# Patient Record
Sex: Female | Born: 1955 | Race: White | Hispanic: No | Marital: Single | Smoking: Never smoker
Health system: Southern US, Community
[De-identification: ages and names within clinical notes are randomized; demographics above are authoritative.]

## PROBLEM LIST (undated history)

## (undated) DIAGNOSIS — H269 Unspecified cataract: Secondary | ICD-10-CM

## (undated) DIAGNOSIS — C801 Malignant (primary) neoplasm, unspecified: Secondary | ICD-10-CM

## (undated) DIAGNOSIS — E039 Hypothyroidism, unspecified: Secondary | ICD-10-CM

## (undated) DIAGNOSIS — M51369 Other intervertebral disc degeneration, lumbar region without mention of lumbar back pain or lower extremity pain: Secondary | ICD-10-CM

## (undated) DIAGNOSIS — T8859XA Other complications of anesthesia, initial encounter: Secondary | ICD-10-CM

## (undated) DIAGNOSIS — E079 Disorder of thyroid, unspecified: Secondary | ICD-10-CM

## (undated) DIAGNOSIS — G709 Myoneural disorder, unspecified: Secondary | ICD-10-CM

## (undated) DIAGNOSIS — M169 Osteoarthritis of hip, unspecified: Secondary | ICD-10-CM

## (undated) DIAGNOSIS — H409 Unspecified glaucoma: Secondary | ICD-10-CM

## (undated) DIAGNOSIS — L309 Dermatitis, unspecified: Secondary | ICD-10-CM

## (undated) DIAGNOSIS — T148XXA Other injury of unspecified body region, initial encounter: Secondary | ICD-10-CM

## (undated) DIAGNOSIS — G4700 Insomnia, unspecified: Secondary | ICD-10-CM

## (undated) DIAGNOSIS — T783XXA Angioneurotic edema, initial encounter: Secondary | ICD-10-CM

## (undated) DIAGNOSIS — M3500 Sicca syndrome, unspecified: Secondary | ICD-10-CM

## (undated) DIAGNOSIS — F32A Depression, unspecified: Secondary | ICD-10-CM

## (undated) DIAGNOSIS — J449 Chronic obstructive pulmonary disease, unspecified: Secondary | ICD-10-CM

## (undated) DIAGNOSIS — S42309A Unspecified fracture of shaft of humerus, unspecified arm, initial encounter for closed fracture: Secondary | ICD-10-CM

## (undated) DIAGNOSIS — M5126 Other intervertebral disc displacement, lumbar region: Secondary | ICD-10-CM

## (undated) DIAGNOSIS — T7840XA Allergy, unspecified, initial encounter: Secondary | ICD-10-CM

## (undated) DIAGNOSIS — M329 Systemic lupus erythematosus, unspecified: Secondary | ICD-10-CM

## (undated) DIAGNOSIS — L509 Urticaria, unspecified: Secondary | ICD-10-CM

## (undated) DIAGNOSIS — T4145XA Adverse effect of unspecified anesthetic, initial encounter: Secondary | ICD-10-CM

## (undated) DIAGNOSIS — M25559 Pain in unspecified hip: Secondary | ICD-10-CM

## (undated) DIAGNOSIS — M5136 Other intervertebral disc degeneration, lumbar region: Secondary | ICD-10-CM

## (undated) DIAGNOSIS — R945 Abnormal results of liver function studies: Secondary | ICD-10-CM

## (undated) DIAGNOSIS — R7989 Other specified abnormal findings of blood chemistry: Secondary | ICD-10-CM

## (undated) DIAGNOSIS — E785 Hyperlipidemia, unspecified: Secondary | ICD-10-CM

## (undated) DIAGNOSIS — G4730 Sleep apnea, unspecified: Secondary | ICD-10-CM

## (undated) DIAGNOSIS — K219 Gastro-esophageal reflux disease without esophagitis: Secondary | ICD-10-CM

## (undated) DIAGNOSIS — F4310 Post-traumatic stress disorder, unspecified: Secondary | ICD-10-CM

## (undated) HISTORY — PX: OTHER SURGICAL HISTORY: SHX169

## (undated) HISTORY — DX: Post-traumatic stress disorder, unspecified: F43.10

## (undated) HISTORY — DX: Hyperlipidemia, unspecified: E78.5

## (undated) HISTORY — DX: Depression, unspecified: F32.A

## (undated) HISTORY — DX: Unspecified cataract: H26.9

## (undated) HISTORY — DX: Allergy, unspecified, initial encounter: T78.40XA

## (undated) HISTORY — DX: Dermatitis, unspecified: L30.9

## (undated) HISTORY — PX: COSMETIC SURGERY: SHX468

## (undated) HISTORY — DX: Urticaria, unspecified: L50.9

## (undated) HISTORY — DX: Unspecified glaucoma: H40.9

## (undated) HISTORY — DX: Gastro-esophageal reflux disease without esophagitis: K21.9

## (undated) HISTORY — DX: Angioneurotic edema, initial encounter: T78.3XXA

## (undated) HISTORY — PX: HERNIA REPAIR: SHX51

## (undated) HISTORY — DX: Myoneural disorder, unspecified: G70.9

---

## 1978-01-19 DIAGNOSIS — K900 Celiac disease: Secondary | ICD-10-CM | POA: Insufficient documentation

## 2009-03-25 ENCOUNTER — Emergency Department (HOSPITAL_COMMUNITY): Admission: EM | Admit: 2009-03-25 | Discharge: 2009-03-25 | Payer: Self-pay | Admitting: Emergency Medicine

## 2009-07-28 ENCOUNTER — Emergency Department (HOSPITAL_COMMUNITY): Admission: EM | Admit: 2009-07-28 | Discharge: 2009-07-28 | Payer: Self-pay | Admitting: Emergency Medicine

## 2009-10-20 ENCOUNTER — Emergency Department (HOSPITAL_COMMUNITY): Admission: EM | Admit: 2009-10-20 | Discharge: 2009-10-20 | Payer: Self-pay | Admitting: Emergency Medicine

## 2009-11-10 ENCOUNTER — Emergency Department (HOSPITAL_COMMUNITY): Admission: EM | Admit: 2009-11-10 | Discharge: 2009-11-10 | Payer: Self-pay | Admitting: Emergency Medicine

## 2010-03-15 ENCOUNTER — Ambulatory Visit (HOSPITAL_COMMUNITY): Admission: RE | Admit: 2010-03-15 | Discharge: 2010-03-15 | Payer: Self-pay | Admitting: Gastroenterology

## 2010-09-10 ENCOUNTER — Emergency Department (HOSPITAL_COMMUNITY)
Admission: EM | Admit: 2010-09-10 | Discharge: 2010-09-10 | Disposition: A | Discharge status: Home / Self Care | Payer: Self-pay | Attending: Emergency Medicine | Admitting: Emergency Medicine

## 2010-09-10 ENCOUNTER — Emergency Department (HOSPITAL_COMMUNITY): Admit: 2010-09-10 | Discharge: 2010-09-10 | Disposition: A | Discharge status: Home / Self Care | Payer: Self-pay

## 2010-09-10 DIAGNOSIS — R0602 Shortness of breath: Secondary | ICD-10-CM | POA: Insufficient documentation

## 2010-09-10 DIAGNOSIS — R0989 Other specified symptoms and signs involving the circulatory and respiratory systems: Secondary | ICD-10-CM | POA: Insufficient documentation

## 2010-09-10 DIAGNOSIS — Z79899 Other long term (current) drug therapy: Secondary | ICD-10-CM | POA: Insufficient documentation

## 2010-09-10 DIAGNOSIS — J069 Acute upper respiratory infection, unspecified: Secondary | ICD-10-CM | POA: Insufficient documentation

## 2010-09-10 DIAGNOSIS — E039 Hypothyroidism, unspecified: Secondary | ICD-10-CM | POA: Insufficient documentation

## 2010-09-10 DIAGNOSIS — R059 Cough, unspecified: Secondary | ICD-10-CM | POA: Insufficient documentation

## 2010-09-10 DIAGNOSIS — J3489 Other specified disorders of nose and nasal sinuses: Secondary | ICD-10-CM | POA: Insufficient documentation

## 2010-09-10 DIAGNOSIS — R05 Cough: Secondary | ICD-10-CM | POA: Insufficient documentation

## 2010-09-10 DIAGNOSIS — R0609 Other forms of dyspnea: Secondary | ICD-10-CM | POA: Insufficient documentation

## 2010-09-10 DIAGNOSIS — J45909 Unspecified asthma, uncomplicated: Secondary | ICD-10-CM | POA: Insufficient documentation

## 2010-10-26 LAB — DIFFERENTIAL
Lymphocytes Relative: 22 % (ref 12–46)
Lymphs Abs: 2.2 10*3/uL (ref 0.7–4.0)
Monocytes Absolute: 0.6 10*3/uL (ref 0.1–1.0)
Neutro Abs: 6.8 10*3/uL (ref 1.7–7.7)
Neutrophils Relative %: 66 % (ref 43–77)

## 2010-10-26 LAB — CBC
HCT: 37.7 % (ref 36.0–46.0)
MCV: 81.3 fL (ref 78.0–100.0)

## 2010-11-03 ENCOUNTER — Ambulatory Visit (HOSPITAL_COMMUNITY): Payer: Self-pay

## 2010-11-08 ENCOUNTER — Ambulatory Visit (HOSPITAL_COMMUNITY)
Admission: RE | Admit: 2010-11-08 | Discharge: 2010-11-08 | Disposition: A | Discharge status: Home / Self Care | Payer: Self-pay | Source: Ambulatory Visit | Attending: Neurology | Admitting: Neurology

## 2010-11-08 DIAGNOSIS — R0609 Other forms of dyspnea: Secondary | ICD-10-CM | POA: Insufficient documentation

## 2010-11-08 DIAGNOSIS — R0989 Other specified symptoms and signs involving the circulatory and respiratory systems: Secondary | ICD-10-CM | POA: Insufficient documentation

## 2010-11-08 LAB — BLOOD GAS, ARTERIAL
Acid-Base Excess: 1.3 mmol/L (ref 0.0–2.0)
FIO2: 21 %
O2 Content: 21 L/min
TCO2: 21.7 mmol/L (ref 0–100)
pH, Arterial: 7.441 — ABNORMAL HIGH (ref 7.350–7.400)
pO2, Arterial: 87.5 mmHg (ref 80.0–100.0)

## 2011-01-03 ENCOUNTER — Emergency Department (HOSPITAL_COMMUNITY)
Admission: EM | Admit: 2011-01-03 | Discharge: 2011-01-03 | Disposition: A | Discharge status: Home / Self Care | Payer: Self-pay | Attending: Emergency Medicine | Admitting: Emergency Medicine

## 2011-01-03 DIAGNOSIS — H6990 Unspecified Eustachian tube disorder, unspecified ear: Secondary | ICD-10-CM | POA: Insufficient documentation

## 2011-01-03 DIAGNOSIS — J45909 Unspecified asthma, uncomplicated: Secondary | ICD-10-CM | POA: Insufficient documentation

## 2011-01-03 DIAGNOSIS — Z79899 Other long term (current) drug therapy: Secondary | ICD-10-CM | POA: Insufficient documentation

## 2011-01-03 DIAGNOSIS — E039 Hypothyroidism, unspecified: Secondary | ICD-10-CM | POA: Insufficient documentation

## 2011-01-03 DIAGNOSIS — H9209 Otalgia, unspecified ear: Secondary | ICD-10-CM | POA: Insufficient documentation

## 2011-01-03 DIAGNOSIS — E669 Obesity, unspecified: Secondary | ICD-10-CM | POA: Insufficient documentation

## 2011-01-03 DIAGNOSIS — H6980 Other specified disorders of Eustachian tube, unspecified ear: Secondary | ICD-10-CM | POA: Insufficient documentation

## 2011-06-14 ENCOUNTER — Other Ambulatory Visit (HOSPITAL_COMMUNITY): Payer: Self-pay | Admitting: Physician Assistant

## 2011-06-14 DIAGNOSIS — Z139 Encounter for screening, unspecified: Secondary | ICD-10-CM

## 2011-06-19 ENCOUNTER — Other Ambulatory Visit: Payer: Self-pay | Admitting: Obstetrics and Gynecology

## 2011-06-19 DIAGNOSIS — Z139 Encounter for screening, unspecified: Secondary | ICD-10-CM

## 2011-06-20 ENCOUNTER — Ambulatory Visit (HOSPITAL_COMMUNITY): Payer: Self-pay

## 2011-06-22 ENCOUNTER — Ambulatory Visit (HOSPITAL_COMMUNITY)
Admission: RE | Admit: 2011-06-22 | Discharge: 2011-06-22 | Disposition: A | Discharge status: Home / Self Care | Payer: PRIVATE HEALTH INSURANCE | Source: Ambulatory Visit | Attending: Obstetrics and Gynecology | Admitting: Obstetrics and Gynecology

## 2011-06-22 DIAGNOSIS — Z139 Encounter for screening, unspecified: Secondary | ICD-10-CM

## 2011-10-30 ENCOUNTER — Encounter (HOSPITAL_COMMUNITY): Payer: Self-pay | Admitting: *Deleted

## 2011-10-30 ENCOUNTER — Emergency Department (HOSPITAL_COMMUNITY): Payer: Worker's Compensation

## 2011-10-30 ENCOUNTER — Emergency Department (HOSPITAL_COMMUNITY)
Admission: EM | Admit: 2011-10-30 | Discharge: 2011-10-30 | Disposition: A | Discharge status: Home / Self Care | Payer: Worker's Compensation | Attending: Emergency Medicine | Admitting: Emergency Medicine

## 2011-10-30 DIAGNOSIS — S52123A Displaced fracture of head of unspecified radius, initial encounter for closed fracture: Secondary | ICD-10-CM | POA: Insufficient documentation

## 2011-10-30 DIAGNOSIS — W19XXXA Unspecified fall, initial encounter: Secondary | ICD-10-CM | POA: Insufficient documentation

## 2011-10-30 DIAGNOSIS — M25529 Pain in unspecified elbow: Secondary | ICD-10-CM | POA: Insufficient documentation

## 2011-10-30 MED ORDER — IBUPROFEN 400 MG PO TABS
400.0000 mg | ORAL_TABLET | Freq: Once | ORAL | Status: AC
  Administered 2011-10-30: 400 mg via ORAL
  Filled 2011-10-30: qty 1

## 2011-10-30 MED ORDER — TRAMADOL HCL 50 MG PO TABS: 50.0000 mg | ORAL_TABLET | Freq: Four times a day (QID) | ORAL | Status: AC | PRN

## 2011-10-30 NOTE — Discharge Instructions (Signed)
Radial Head Fracture  A radial head fracture is a break of the smaller bone (radius) in the forearm. The head of this bone is the part near the elbow. These fractures commonly happen during a fall when you land on the outstretched arm. These fractures are more common in middle aged adults and are common with a dislocation of the elbow.  SYMPTOMS    Swelling of the elbow joint and pain on the outside of the elbow.   Pain and difficulty in bending or straightening the elbow.   Pain and difficulty in turning the palm of the hand up or down with the elbow bent.  DIAGNOSIS   Your caregiver may make this diagnosis by a physical exam. X-rays can confirm the type and amount of break. Sometimes a break which is not displaced cannot be seen on the original x-ray.  TREATMENT   Radial head fractures are classified according to the amount of movement (displacement) of parts from the normal position.   Type 1 Fractures   Type 1 fractures are generally small fractures in which bone pieces remain together (non-displaced fracture).   The fracture may not be seen on initial X-rays. Usually if x-rays are repeated two to three weeks later, the fracture will show up. A splint or sling is used for a few days. Gentle early motion is used to prevent the elbow from becoming stiff. It should not be done vigorously or forced as this could displace the bone pieces.  Type 2 Fractures   With type 2 fractures, bone pieces are slightly displaced and larger pieces of bone are broken off.   If only a little displacement of the bone piece is present, splinting for 4 to 5 days usually works well. This is again followed with gentle active range of motion. Small fragments may be surgically removed.   Large pieces of bone that can be put back into place will sometimes be fixed with pins or screws to hold them until the bone is healed. If this cannot be done, the fragments are removed. For older, less active people, sometimes the entire radial  head is removed if the wrist is not injured. The elbow and arm will still work fine. Soft tissue, tendon, and ligament injuries are corrected at the same time.  Type 3 Fractures   Type 3 fractures have multiple broken pieces of bone which cannot be fixed. Surgery is usually needed to remove the broken bits of bone and what is left of the radial head. Soft-tissue damage is repaired. Gentle early motion is used to prevent the elbow from becoming stiff. Sometimes an artificial radial head can be used to prevent deformity if elbow is instable.  Rest, ice, elevation, immobilization, medications, and pain control are used in the early care.  HOME CARE INSTRUCTIONS    Keep the injured part elevated while sitting or lying down. Keep the injury above the level of your heart (the center of the chest). This will decrease swelling and pain.   Apply ice to the injury for 15 to 20 minutes, 3 to 4 times per day while awake, for 2 days. Put the ice in a plastic bag and place a towel between the bag of ice and your cast or splint.   Move your fingers to avoid stiffness and minimize swelling.   If you have a plaster or fiberglass cast:   Do not try to scratch the skin under the cast using sharp or pointed objects.   Check the skin   around the cast every day. You may put lotion on any red or sore areas.   Keep your cast dry and clean.   If you have a plaster splint:   Wear the splint as directed.   You may loosen the elastic around the splint if your fingers become numb, tingle, or turn cold or blue.   Do not put pressure on any part of your cast or splint. It may break. Rest your cast only on a pillow for the first 24 hours until it is fully hardened.   Your cast or splint can be protected during bathing with a plastic bag. Do not lower the cast or splint into water.   Only take over-the-counter or prescription medicines for pain, discomfort, or fever as directed by your caregiver.   Follow all instructions for follow  up with your caregiver. This includes any orthopedic referrals, physical therapy and rehabilitation. Any delay in obtaining necessary care could result in a delay or failure of the bones to heal or permanent elbow stiffness.   Do not over do exercises. This could further damage your injury.  SEEK IMMEDIATE MEDICAL CARE IF:    Your cast or splint gets damaged or breaks.   You have more severe pain or swelling than you did before getting the cast.   You have severe pain when stretching your fingers.   There is a bad smell, new stains and/or pus-like (purulent) drainage coming from under the cast.   Your fingers or hand turn pale or blue, become cold, or you lose feeling.  Document Released: 05/15/2006 Document Revised: 07/13/2011 Document Reviewed: 06/22/2009  [employer] Patient Information 2012 [employer].

## 2011-10-30 NOTE — ED Notes (Signed)
Foam sling applied to right arm

## 2011-10-30 NOTE — ED Provider Notes (Signed)
History     CSN: [id number]  Arrival date & time 10/30/11  1904   First MD Initiated Contact with Patient 10/30/11 1928      Chief Complaint  Patient presents with  . Fall    (Consider location/radiation/quality/duration/timing/severity/associated sxs/prior treatment) HPI Pt had mechanical trip and fall landing on R elbow. Since she has had pain with full range of motion. No swelling, bruising noted. No head, neck or lower ext pain.  Past Medical History  Diagnosis Date  . Asthma     History reviewed. No pertinent past surgical history.  No family history on file.  History  Substance Use Topics  . Smoking status: Never Smoker   . Smokeless tobacco: Not on file  . Alcohol Use: No    OB History    Grav Para Term Preterm Abortions TAB SAB Ect Mult Living                  Review of Systems  Neurological: Negative for syncope, weakness and numbness.    Allergies  Jasmine fragrance; Pork-derived products; Latex; and Sulfa antibiotics  Home Medications   Current Outpatient Rx  Name Route Sig Dispense Refill  . ACETAMINOPHEN 500 MG PO TABS Oral Take 1,000 mg by mouth as needed. For pain    . ALBUTEROL SULFATE HFA 108 (90 BASE) MCG/ACT IN AERS Inhalation Inhale 2 puffs into the lungs every 6 (six) hours as needed. For allergies    . IBUPROFEN 200 MG PO TABS Oral Take 400 mg by mouth once as needed. For hip pain    . LEVOTHYROXINE SODIUM 100 MCG PO TABS Oral Take 100 mcg by mouth every morning.    Marland Kitchen TRAMADOL HCL 50 MG PO TABS Oral Take 1 tablet (50 mg total) by mouth every 6 (six) hours as needed for pain. 20 tablet 0    BP 139/82  Pulse 106  Temp(Src) 97.9 F (36.6 C) (Oral)  Resp 16  Ht 5\' 6"  (1.676 m)  Wt 327 lb (148.326 kg)  BMI 52.78 kg/m2  Physical Exam  Nursing note and vitals reviewed. Constitutional: She is oriented to person, place, and time. She appears well-developed and well-nourished. No distress.  HENT:  Head: Normocephalic and atraumatic.    Mouth/Throat: Oropharynx is clear and moist.  Eyes: EOM are normal. Pupils are equal, round, and reactive to light.  Neck: Normal range of motion. Neck supple.  Cardiovascular: Normal rate and regular rhythm.   Pulmonary/Chest: Effort normal and breath sounds normal. No respiratory distress. She has no wheezes. She has no rales.  Abdominal: Soft. Bowel sounds are normal.  Musculoskeletal: She exhibits tenderness. She exhibits no edema.       Decreased ROM due to pain. Most painful with flexion. No definite injury noted. 2+ radial pulses, sensation and motor intact.   Neurological: She is alert and oriented to person, place, and time.  Skin: Skin is warm and dry. No rash noted. No erythema.  Psychiatric: She has a normal mood and affect. Her behavior is normal.    ED Course  Procedures (including critical care time)  Labs Reviewed - No data to display Dg Elbow Complete Right  10/30/2011  *RADIOLOGY REPORT*  Clinical Data: Status post fall next field  RIGHT ELBOW - COMPLETE 3+ VIEW  Comparison: None.  Findings: There is a joint effusion with elevation of the anterior posterior fat pads.  There is a nondisplaced, intra-articular fracture deformity involving the head of the radius.  No dislocations.  IMPRESSION:  1.  Radial head fracture. 2.  Joint effusion.  Original Report Authenticated By: Rosealee Albee, M.D.     1. Radial head fracture, closed       MDM  Will check xray and place in sling for comfort        Loren Racer, MD 10/30/11 2122

## 2011-10-30 NOTE — ED Notes (Signed)
Fell at work, states she has pain in right elbow, states she hit her head, right shoulder and right knee. Only has significant pain in right elbow. 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

## 2011-11-01 ENCOUNTER — Ambulatory Visit (HOSPITAL_COMMUNITY)
Admission: RE | Admit: 2011-11-01 | Discharge: 2011-11-01 | Disposition: A | Discharge status: Home / Self Care | Payer: Worker's Compensation | Source: Ambulatory Visit | Attending: Orthopaedic Surgery | Admitting: Orthopaedic Surgery

## 2011-11-01 ENCOUNTER — Other Ambulatory Visit (HOSPITAL_COMMUNITY): Payer: Self-pay | Admitting: Orthopaedic Surgery

## 2011-11-01 DIAGNOSIS — W19XXXA Unspecified fall, initial encounter: Secondary | ICD-10-CM

## 2011-11-01 DIAGNOSIS — M25569 Pain in unspecified knee: Secondary | ICD-10-CM | POA: Insufficient documentation

## 2011-11-01 DIAGNOSIS — R52 Pain, unspecified: Secondary | ICD-10-CM

## 2012-02-08 ENCOUNTER — Encounter (HOSPITAL_COMMUNITY): Payer: Self-pay | Admitting: *Deleted

## 2012-02-08 ENCOUNTER — Emergency Department (HOSPITAL_COMMUNITY)
Admission: EM | Admit: 2012-02-08 | Discharge: 2012-02-08 | Disposition: A | Discharge status: Home / Self Care | Payer: Worker's Compensation | Attending: Emergency Medicine | Admitting: Emergency Medicine

## 2012-02-08 DIAGNOSIS — M25539 Pain in unspecified wrist: Secondary | ICD-10-CM | POA: Insufficient documentation

## 2012-02-08 DIAGNOSIS — E079 Disorder of thyroid, unspecified: Secondary | ICD-10-CM | POA: Insufficient documentation

## 2012-02-08 DIAGNOSIS — M169 Osteoarthritis of hip, unspecified: Secondary | ICD-10-CM | POA: Insufficient documentation

## 2012-02-08 DIAGNOSIS — M1610 Unilateral primary osteoarthritis, unspecified hip: Secondary | ICD-10-CM | POA: Insufficient documentation

## 2012-02-08 DIAGNOSIS — M79631 Pain in right forearm: Secondary | ICD-10-CM

## 2012-02-08 DIAGNOSIS — J4489 Other specified chronic obstructive pulmonary disease: Secondary | ICD-10-CM | POA: Insufficient documentation

## 2012-02-08 DIAGNOSIS — J449 Chronic obstructive pulmonary disease, unspecified: Secondary | ICD-10-CM | POA: Insufficient documentation

## 2012-02-08 HISTORY — DX: Osteoarthritis of hip, unspecified: M16.9

## 2012-02-08 HISTORY — DX: Chronic obstructive pulmonary disease, unspecified: J44.9

## 2012-02-08 HISTORY — DX: Unspecified fracture of shaft of humerus, unspecified arm, initial encounter for closed fracture: S42.309A

## 2012-02-08 HISTORY — DX: Disorder of thyroid, unspecified: E07.9

## 2012-02-08 MED ORDER — ONDANSETRON HCL 4 MG PO TABS: 8.0000 mg | ORAL_TABLET | Freq: Three times a day (TID) | ORAL | Status: AC | PRN

## 2012-02-08 MED ORDER — HYDROMORPHONE HCL PF 1 MG/ML IJ SOLN
1.0000 mg | Freq: Once | INTRAMUSCULAR | Status: AC
  Administered 2012-02-08: 1 mg via INTRAMUSCULAR
  Filled 2012-02-08: qty 1

## 2012-02-08 MED ORDER — ONDANSETRON 8 MG PO TBDP
8.0000 mg | ORAL_TABLET | Freq: Once | ORAL | Status: AC
  Administered 2012-02-08: 8 mg via ORAL
  Filled 2012-02-08: qty 1

## 2012-02-08 MED ORDER — OXYCODONE-ACETAMINOPHEN 5-325 MG PO TABS: 1.0000 | ORAL_TABLET | ORAL | Status: AC | PRN

## 2012-02-08 NOTE — ED Notes (Addendum)
Right arm pain that includes right forearm, elbow and hand, pt states that the pain is so bad that she gets nauseous at time. Pt is seeing Dr. Hilda Lias for the arm injury, states that she is having problems with her workers compensation and is having difficultly with follow up. Pt states that she did talk to Dr. Hilda Lias a two weeks ago, was given hydrocodone and naprosyn and the pain is not any better, pt states that she does not like taking the medication she was given

## 2012-02-09 NOTE — ED Provider Notes (Signed)
History     CSN: [id number]  Arrival date & time 02/08/12  1123   First MD Initiated Contact with Patient 02/08/12 1157      Chief Complaint  Patient presents with  . Arm Pain    (Consider location/radiation/quality/duration/timing/severity/associated sxs/prior treatment) HPI Comments: Catherine Turner presents with chronic right forearm and wrist pain since she had a fall resulting in a fracture of her right elbow 4 months ago.  Her injury occurred at work and she is being treated by Dr. Hilda Lias and she has recovered from her initial fracture,  But just had an mri of her right hand and wrist secondary to ongoing chronic pain at these locations.  Her next appt to discuss the results of her mri will occur in 5 days.  In the interim,  She is having worse pain which is intense,  Constant and is so bad it makes her nauseated.  She denies any new injuries.  She is currently taking hydrocodone and naprosyn which has not been helpful.  She denies numbness and weakness in the hand.  She reports plain film xrays have been negative.  The history is provided by the patient.    Past Medical History  Diagnosis Date  . Asthma   . COPD (chronic obstructive pulmonary disease)   . Arm fracture     right arm   . DJD (degenerative joint disease) of hip   . Thyroid disease     History reviewed. No pertinent past surgical history.  No family history on file.  History  Substance Use Topics  . Smoking status: Never Smoker   . Smokeless tobacco: Not on file  . Alcohol Use: No    OB History    Grav Para Term Preterm Abortions TAB SAB Ect Mult Living                  Review of Systems  Musculoskeletal: Positive for arthralgias.  Skin: Negative for wound.  Neurological: Negative for weakness and numbness.    Allergies  Jasmine fragrance; Latex; Pork-derived products; and Sulfa antibiotics  Home Medications   Current Outpatient Rx  Name Route Sig Dispense Refill  . ACETAMINOPHEN 500 MG  PO TABS Oral Take 1,000 mg by mouth every 8 (eight) hours as needed. For pain    . ALBUTEROL SULFATE HFA 108 (90 BASE) MCG/ACT IN AERS Inhalation Inhale 2 puffs into the lungs every 6 (six) hours as needed. For allergies    . GLUCOSAMINE PO Oral Take 1 tablet by mouth every other day.    Marland Kitchen HYDROCODONE-ACETAMINOPHEN 5-325 MG PO TABS Oral Take 1 tablet by mouth every 6 (six) hours as needed. Pain    . LEVOTHYROXINE SODIUM 100 MCG PO TABS Oral Take 100 mcg by mouth every morning.    Marland Kitchen NAPROXEN 500 MG PO TABS Oral Take 500 mg by mouth every other day.    Marland Kitchen ONDANSETRON HCL 4 MG PO TABS Oral Take 2 tablets (8 mg total) by mouth every 8 (eight) hours as needed for nausea. 12 tablet 0  . OXYCODONE-ACETAMINOPHEN 5-325 MG PO TABS Oral Take 1 tablet by mouth every 4 (four) hours as needed for pain. 20 tablet 0    BP 151/92  Pulse 70  Temp 98.3 F (36.8 C) (Oral)  Resp 20  Ht 5\' 6"  (1.676 m)  Wt 324 lb (146.965 kg)  BMI 52.29 kg/m2  SpO2 98%  Physical Exam  Constitutional: She appears well-developed and well-nourished.  HENT:  Head: Atraumatic.  Neck: Normal range of motion.  Cardiovascular:       Pulses equal bilaterally  Musculoskeletal: She exhibits tenderness.       Arms:      Right hand: She exhibits tenderness. She exhibits normal capillary refill, no deformity and no swelling. normal sensation noted. Normal strength noted.       Hands: Neurological: She is alert. She has normal strength. She displays normal reflexes. No sensory deficit.       Equal strength  Skin: Skin is warm and dry.  Psychiatric: She has a normal mood and affect.    ED Course  Procedures (including critical care time)  Labs Reviewed - No data to display No results found.   1. Right forearm pain       MDM  Patient awaiting mri results for chronic pain issue since fall on outstretched right upper extremity.  MRI completed out of our system,  Unable to find result.  Prescribed oxycodone,  zofran for  nausea,  Encouraged pt to keep appt with Dr. Hilda Lias as scheduled.        Burgess Amor, PA 02/09/12 1704

## 2012-02-12 NOTE — ED Provider Notes (Signed)
Medical screening examination/treatment/procedure(s) were performed by non-physician practitioner and as supervising physician I was immediately available for consultation/collaboration. Deacon Gadbois, MD, FACEP   Amore Grater L Caroleen Stoermer, MD 02/12/12 2110 

## 2012-04-12 ENCOUNTER — Encounter (HOSPITAL_COMMUNITY): Payer: Self-pay | Admitting: *Deleted

## 2012-04-12 ENCOUNTER — Emergency Department (HOSPITAL_COMMUNITY)
Admission: EM | Admit: 2012-04-12 | Discharge: 2012-04-12 | Disposition: A | Discharge status: Home / Self Care | Payer: Self-pay | Attending: Emergency Medicine | Admitting: Emergency Medicine

## 2012-04-12 ENCOUNTER — Emergency Department (HOSPITAL_COMMUNITY): Payer: Self-pay

## 2012-04-12 DIAGNOSIS — E079 Disorder of thyroid, unspecified: Secondary | ICD-10-CM | POA: Insufficient documentation

## 2012-04-12 DIAGNOSIS — J449 Chronic obstructive pulmonary disease, unspecified: Secondary | ICD-10-CM | POA: Insufficient documentation

## 2012-04-12 DIAGNOSIS — M169 Osteoarthritis of hip, unspecified: Secondary | ICD-10-CM | POA: Insufficient documentation

## 2012-04-12 DIAGNOSIS — J40 Bronchitis, not specified as acute or chronic: Secondary | ICD-10-CM

## 2012-04-12 DIAGNOSIS — J4489 Other specified chronic obstructive pulmonary disease: Secondary | ICD-10-CM | POA: Insufficient documentation

## 2012-04-12 DIAGNOSIS — Z882 Allergy status to sulfonamides status: Secondary | ICD-10-CM | POA: Insufficient documentation

## 2012-04-12 DIAGNOSIS — M1610 Unilateral primary osteoarthritis, unspecified hip: Secondary | ICD-10-CM | POA: Insufficient documentation

## 2012-04-12 MED ORDER — ALBUTEROL SULFATE (5 MG/ML) 0.5% IN NEBU
5.0000 mg | INHALATION_SOLUTION | Freq: Once | RESPIRATORY_TRACT | Status: AC
  Administered 2012-04-12: 5 mg via RESPIRATORY_TRACT
  Filled 2012-04-12: qty 1

## 2012-04-12 MED ORDER — PREDNISONE 20 MG PO TABS
60.0000 mg | ORAL_TABLET | Freq: Once | ORAL | Status: AC
  Administered 2012-04-12: 60 mg via ORAL
  Filled 2012-04-12: qty 3

## 2012-04-12 MED ORDER — PREDNISONE 20 MG PO TABS: 40.0000 mg | ORAL_TABLET | Freq: Every day | ORAL | Status: AC

## 2012-04-12 MED ORDER — IPRATROPIUM BROMIDE 0.02 % IN SOLN
0.5000 mg | Freq: Once | RESPIRATORY_TRACT | Status: AC
  Administered 2012-04-12: 0.5 mg via RESPIRATORY_TRACT
  Filled 2012-04-12: qty 2.5

## 2012-04-12 MED ORDER — ALBUTEROL SULFATE (2.5 MG/3ML) 0.083% IN NEBU: 2.5000 mg | INHALATION_SOLUTION | RESPIRATORY_TRACT | Status: DC | PRN

## 2012-04-12 MED ORDER — ALBUTEROL SULFATE HFA 108 (90 BASE) MCG/ACT IN AERS
2.0000 | INHALATION_SPRAY | RESPIRATORY_TRACT | Status: AC
  Administered 2012-04-12: 2 via RESPIRATORY_TRACT
  Filled 2012-04-12: qty 6.7

## 2012-04-12 NOTE — ED Provider Notes (Signed)
History     CSN: [id number]  Arrival date & time 04/12/12  1839   First MD Initiated Contact with Patient 04/12/12 1843      Chief Complaint  Patient presents with  . Cough  . Nasal Congestion    HPI Pt was seen at 1910.  Per pt, c/o gradual onset and persistence of constant runny/stuffy nose, cough, wheezing, and SOB for the past 3 weeks. Pt states she has been using her MDI without relief.  Describes her symptoms as "my asthma" and "I need a nebulizer treatment."  States she went to another hospital this past week for same symptoms and was "given a xanax" without relief.  Denies CP/palpitations, no fevers, no rash, no sore throat, no abd pain, no calf/LE pain or unilateral swelling.    Past Medical History  Diagnosis Date  . Asthma   . COPD (chronic obstructive pulmonary disease)   . Arm fracture     right arm   . DJD (degenerative joint disease) of hip   . Thyroid disease     History reviewed. No pertinent past surgical history.   History  Substance Use Topics  . Smoking status: Never Smoker   . Smokeless tobacco: Not on file  . Alcohol Use: No    Review of Systems ROS: Statement: All systems negative except as marked or noted in the HPI; Constitutional: Negative for fever and chills. ; ; Eyes: Negative for eye pain, redness and discharge. ; ; ENMT: +runny/stuffy nose. Negative for ear pain, hoarseness, sinus pressure and sore throat. ; ; Cardiovascular: Negative for chest pain, palpitations, diaphoresis, and peripheral edema. ; ; Respiratory: +cough, wheezing, SOB. Negative for stridor. ; ; Gastrointestinal: Negative for nausea, vomiting, diarrhea, abdominal pain, blood in stool, hematemesis, jaundice and rectal bleeding. . ; ; Genitourinary: Negative for dysuria, flank pain and hematuria. ; ; Musculoskeletal: Negative for back pain and neck pain. Negative for swelling and trauma.; ; Skin: Negative for pruritus, rash, abrasions, blisters, bruising and skin lesion.; ; Neuro:  Negative for headache, lightheadedness and neck stiffness. Negative for weakness, altered level of consciousness , altered mental status, extremity weakness, paresthesias, involuntary movement, seizure and syncope.     Allergies  Jasmine fragrance; Latex; Pork-derived products; and Sulfa antibiotics  Home Medications   Current Outpatient Rx  Name Route Sig Dispense Refill  . ACETAMINOPHEN 500 MG PO TABS Oral Take 1,000 mg by mouth every 8 (eight) hours as needed. For pain    . ALBUTEROL SULFATE HFA 108 (90 BASE) MCG/ACT IN AERS Inhalation Inhale 2 puffs into the lungs every 6 (six) hours as needed. For allergies    . GLUCOSAMINE PO Oral Take 1 tablet by mouth every other day.    Marland Kitchen HYDROCODONE-ACETAMINOPHEN 5-325 MG PO TABS Oral Take 1 tablet by mouth every 6 (six) hours as needed. Pain    . LEVOTHYROXINE SODIUM 100 MCG PO TABS Oral Take 100 mcg by mouth every morning.    Marland Kitchen NAPROXEN 500 MG PO TABS Oral Take 500 mg by mouth every other day.      BP 126/90  Pulse 82  Temp 98.2 F (36.8 C) (Oral)  Resp 20  Ht 5\' 6"  (1.676 m)  Wt 319 lb (144.697 kg)  BMI 51.49 kg/m2  SpO2 100%  Physical Exam 1915:  Physical examination:  Nursing notes reviewed; Vital signs and O2 SAT reviewed;  Constitutional: Well developed, Well nourished, Well hydrated, In no acute distress; Head:  Normocephalic, atraumatic; Eyes: EOMI, PERRL, No scleral  icterus; ENMT: TM's clear bilat.  +edemetous nasal turbinates bilat with clear rhinorrhea. Mouth and pharynx normal, Mucous membranes moist; Neck: Supple, Full range of motion, No lymphadenopathy; Cardiovascular: Regular rate and rhythm, No murmur, rub, or gallop; Respiratory: Breath sounds coarse & equal bilaterally, No wheezes.  Speaking full sentences with ease, Normal respiratory effort/excursion; Chest: Nontender, Movement normal; Abdomen: Soft, Nontender, Nondistended, Normal bowel sounds;; Extremities: Pulses normal, No tenderness, No edema, No calf edema or  asymmetry.; Neuro: AA&Ox3, Major CN grossly intact.  Speech clear. No gross focal motor or sensory deficits in extremities.; Skin: Color normal, Warm, Dry.   ED Course  Procedures    MDM  MDM Reviewed: nursing note and vitals Interpretation: x-ray   Dg Chest 2 View 04/12/2012  *RADIOLOGY REPORT*  Clinical Data: 3-week history of cough and shortness of breath. Back pain.  CHEST - 2 VIEW  Comparison: Chest x-ray 09/10/2010.  Findings: Lung volumes are normal.  No consolidative airspace disease.  No pleural effusions.  No pneumothorax.  No pulmonary nodule or mass noted.  Pulmonary vasculature and the cardiomediastinal silhouette are within normal limits.  IMPRESSION: 1. No radiographic evidence of acute cardiopulmonary disease.   Original Report Authenticated By: Florencia Reasons, M.D.      2100:  Pt feels better after neb and prednisone.  Wants to go home now.  NAD, speaking full sentences with ease, lungs CTA bilat, resps without distress, Sats 100% R/A.  Dx testing d/w pt and family.  Questions answered.  Verb understanding, agreeable to d/c home with outpt f/u.      Laray Anger, DO 04/15/12 [id number]

## 2012-04-12 NOTE — ED Notes (Signed)
Patient requesting to speak to someone regarding anxiety and nerves. Spoke with dr. Clarene Duke and agreed to give patient the number to [facility] for follow up outside of the ER.

## 2012-04-12 NOTE — ED Notes (Signed)
Contacted respiratory regarding breathing treatments on patient.

## 2012-04-12 NOTE — ED Notes (Signed)
C/o cough, nasal congestion x 3 weeks.

## 2012-06-17 ENCOUNTER — Other Ambulatory Visit (HOSPITAL_COMMUNITY): Payer: Self-pay | Admitting: Physician Assistant

## 2012-06-17 DIAGNOSIS — Z139 Encounter for screening, unspecified: Secondary | ICD-10-CM

## 2012-06-24 ENCOUNTER — Ambulatory Visit (HOSPITAL_COMMUNITY)
Admission: RE | Admit: 2012-06-24 | Discharge: 2012-06-24 | Disposition: A | Discharge status: Home / Self Care | Payer: Self-pay | Source: Ambulatory Visit | Attending: Physician Assistant | Admitting: Physician Assistant

## 2012-06-24 DIAGNOSIS — Z139 Encounter for screening, unspecified: Secondary | ICD-10-CM

## 2012-07-10 ENCOUNTER — Encounter (HOSPITAL_COMMUNITY): Payer: Self-pay | Admitting: *Deleted

## 2012-07-10 ENCOUNTER — Emergency Department (HOSPITAL_COMMUNITY)
Admission: EM | Admit: 2012-07-10 | Discharge: 2012-07-10 | Disposition: A | Discharge status: Home / Self Care | Payer: Self-pay | Attending: Emergency Medicine | Admitting: Emergency Medicine

## 2012-07-10 DIAGNOSIS — M25559 Pain in unspecified hip: Secondary | ICD-10-CM | POA: Insufficient documentation

## 2012-07-10 DIAGNOSIS — Z8781 Personal history of (healed) traumatic fracture: Secondary | ICD-10-CM | POA: Insufficient documentation

## 2012-07-10 DIAGNOSIS — M169 Osteoarthritis of hip, unspecified: Secondary | ICD-10-CM | POA: Insufficient documentation

## 2012-07-10 DIAGNOSIS — G8929 Other chronic pain: Secondary | ICD-10-CM | POA: Insufficient documentation

## 2012-07-10 DIAGNOSIS — J4489 Other specified chronic obstructive pulmonary disease: Secondary | ICD-10-CM | POA: Insufficient documentation

## 2012-07-10 DIAGNOSIS — M25552 Pain in left hip: Secondary | ICD-10-CM

## 2012-07-10 DIAGNOSIS — Z79899 Other long term (current) drug therapy: Secondary | ICD-10-CM | POA: Insufficient documentation

## 2012-07-10 DIAGNOSIS — M1610 Unilateral primary osteoarthritis, unspecified hip: Secondary | ICD-10-CM | POA: Insufficient documentation

## 2012-07-10 DIAGNOSIS — J45909 Unspecified asthma, uncomplicated: Secondary | ICD-10-CM | POA: Insufficient documentation

## 2012-07-10 DIAGNOSIS — J449 Chronic obstructive pulmonary disease, unspecified: Secondary | ICD-10-CM | POA: Insufficient documentation

## 2012-07-10 DIAGNOSIS — J40 Bronchitis, not specified as acute or chronic: Secondary | ICD-10-CM | POA: Insufficient documentation

## 2012-07-10 DIAGNOSIS — E079 Disorder of thyroid, unspecified: Secondary | ICD-10-CM | POA: Insufficient documentation

## 2012-07-10 MED ORDER — HYDROCOD POLST-CHLORPHEN POLST 10-8 MG/5ML PO LQCR: 5.0000 mL | Freq: Two times a day (BID) | ORAL | Status: DC | PRN

## 2012-07-10 MED ORDER — PREDNISONE 20 MG PO TABS: ORAL_TABLET | ORAL | Status: DC

## 2012-07-10 MED ORDER — AZITHROMYCIN 250 MG PO TABS: ORAL_TABLET | ORAL | Status: DC

## 2012-07-10 NOTE — ED Provider Notes (Signed)
History     CSN: [id number]  Arrival date & time 07/10/12  1132   First MD Initiated Contact with Patient 07/10/12 1215      Chief Complaint  Patient presents with  . Cough  . Hip Pain    (Consider location/radiation/quality/duration/timing/severity/associated sxs/prior treatment) HPI Comments: Patient c/o cough and nasal congestion for 6 days.  States the cough is occassionally productive.  She has occasional wheezing.  She denies fever, shortness of breath or chest pain.  She has hx of asthma but states its managed with albuterol.  She also c/o left hip pain that chronic, but states has been aggravated by the frequent coughing.  No recent injury.    Patient is a 56 y.o. female presenting with cough. The history is provided by the patient.  Cough This is a new problem. The current episode started more than 1 week ago. The problem occurs constantly. The cough is productive of sputum. There has been no fever. Pertinent negatives include no chest pain, no chills, no ear pain, no headaches, no rhinorrhea, no sore throat, no myalgias, no shortness of breath and no wheezing. She has tried nothing for the symptoms. The treatment provided no relief. She is not a smoker. Her past medical history is significant for asthma. Her past medical history does not include pneumonia.    Past Medical History  Diagnosis Date  . Asthma   . COPD (chronic obstructive pulmonary disease)   . Arm fracture     right arm   . DJD (degenerative joint disease) of hip   . Thyroid disease     History reviewed. No pertinent past surgical history.  No family history on file.  History  Substance Use Topics  . Smoking status: Never Smoker   . Smokeless tobacco: Not on file  . Alcohol Use: No    OB History    Grav Para Term Preterm Abortions TAB SAB Ect Mult Living                  Review of Systems  Constitutional: Negative for chills, activity change and appetite change.  HENT: Positive for  congestion and sneezing. Negative for ear pain, sore throat, facial swelling, rhinorrhea, trouble swallowing, neck pain and neck stiffness.   Respiratory: Positive for cough. Negative for chest tightness, shortness of breath and wheezing.   Cardiovascular: Negative for chest pain, palpitations and leg swelling.  Gastrointestinal: Negative for nausea, vomiting and abdominal pain.  Musculoskeletal: Positive for arthralgias. Negative for myalgias and joint swelling.  Neurological: Negative for headaches.  All other systems reviewed and are negative.    Allergies  Jasmine fragrance; Latex; Pork-derived products; and Sulfa antibiotics  Home Medications   Current Outpatient Rx  Name  Route  Sig  Dispense  Refill  . ACETAMINOPHEN 500 MG PO TABS   Oral   Take 1,000 mg by mouth every 8 (eight) hours as needed. For pain         . ALBUTEROL SULFATE (2.5 MG/3ML) 0.083% IN NEBU   Nebulization   Take 3 mLs (2.5 mg total) by nebulization every 4 (four) hours as needed for wheezing.   75 mL   0   . ALBUTEROL SULFATE HFA 108 (90 BASE) MCG/ACT IN AERS   Inhalation   Inhale 2 puffs into the lungs every 6 (six) hours as needed. For allergies         . GLUCOSAMINE PO   Oral   Take 1 tablet by mouth every other day.         Marland Kitchen  HYDROCODONE-ACETAMINOPHEN 5-325 MG PO TABS   Oral   Take 1 tablet by mouth 2 (two) times daily. Pain         . LEVOTHYROXINE SODIUM 100 MCG PO TABS   Oral   Take 100 mcg by mouth every morning.         Marland Kitchen NAPROXEN 500 MG PO TABS   Oral   Take 500 mg by mouth daily as needed. inflammation           BP 139/66  Pulse 70  Temp 98.2 F (36.8 C) (Oral)  Resp 20  Ht 5\' 6"  (1.676 m)  Wt 309 lb (140.161 kg)  BMI 49.87 kg/m2  SpO2 99%  Physical Exam  Nursing note and vitals reviewed. Constitutional: She is oriented to person, place, and time. She appears well-developed and well-nourished. No distress.  HENT:  Head: Normocephalic and atraumatic.  Right  Ear: Tympanic membrane and ear canal normal.  Left Ear: Tympanic membrane and ear canal normal.  Mouth/Throat: Uvula is midline, oropharynx is clear and moist and mucous membranes are normal. No oropharyngeal exudate.  Eyes: EOM are normal. Pupils are equal, round, and reactive to light.  Neck: Normal range of motion. Neck supple.  Cardiovascular: Normal rate, regular rhythm, normal heart sounds and intact distal pulses.   No murmur heard. Pulmonary/Chest: Effort normal. No respiratory distress. She has no wheezes. She has no rales. She exhibits no tenderness.       Coarse lungs sounds bilaterally.  No active wheezing or rales.  Abdominal: Soft. Bowel sounds are normal.  Musculoskeletal: Normal range of motion. She exhibits no edema.       Left hip: She exhibits tenderness. She exhibits normal range of motion, normal strength, no bony tenderness, no swelling, no crepitus, no deformity and no laceration.       Legs:      ttp of the lateral left hip.  ROM is preserved.  DP pulse brisk, distal sensation intact.  No edema or erythema  Lymphadenopathy:    She has no cervical adenopathy.  Neurological: She is alert and oriented to person, place, and time. No cranial nerve deficit or sensory deficit. She exhibits normal muscle tone. Coordination and gait normal.  Reflex Scores:      Patellar reflexes are 2+ on the right side and 2+ on the left side.      Achilles reflexes are 2+ on the right side and 2+ on the left side. Skin: Skin is warm and dry.    ED Course  Procedures (including critical care time)  Labs Reviewed - No data to display No results found.      MDM   Vitals reviewed.   Patient is alert, well-appearing. No tachycardia tachypnea or hypoxia. Lung sounds are coarse without wheezing or rales. Patient has a nebulizer with albuterol at home. Symptoms are likely related to bronchitis. I am doubtful of PE. Left hip pain is chronic patient is currently taking Norco.  She  agrees to close followup with her primary care physician. I will prescribe prednisone, tussionex,  and Zithromax      Arjen Deringer L. Laura, [place] 07/11/12 2135

## 2012-07-10 NOTE — ED Notes (Signed)
Cough x 6 days.  Chronic left hip worsening x 5 days.

## 2012-07-12 NOTE — ED Provider Notes (Signed)
Medical screening examination/treatment/procedure(s) were performed by non-physician practitioner and as supervising physician I was immediately available for consultation/collaboration. Shemaiah Round, MD, FACEP   Zarielle Cea L Kingstin Heims, MD 07/12/12 2117 

## 2012-09-25 ENCOUNTER — Emergency Department (HOSPITAL_COMMUNITY)
Admission: EM | Admit: 2012-09-25 | Discharge: 2012-09-25 | Disposition: A | Discharge status: Home / Self Care | Payer: Self-pay | Attending: Emergency Medicine | Admitting: Emergency Medicine

## 2012-09-25 ENCOUNTER — Encounter (HOSPITAL_COMMUNITY): Payer: Self-pay | Admitting: *Deleted

## 2012-09-25 DIAGNOSIS — E079 Disorder of thyroid, unspecified: Secondary | ICD-10-CM | POA: Insufficient documentation

## 2012-09-25 DIAGNOSIS — Z79899 Other long term (current) drug therapy: Secondary | ICD-10-CM | POA: Insufficient documentation

## 2012-09-25 DIAGNOSIS — J3489 Other specified disorders of nose and nasal sinuses: Secondary | ICD-10-CM | POA: Insufficient documentation

## 2012-09-25 DIAGNOSIS — Z8739 Personal history of other diseases of the musculoskeletal system and connective tissue: Secondary | ICD-10-CM | POA: Insufficient documentation

## 2012-09-25 DIAGNOSIS — IMO0002 Reserved for concepts with insufficient information to code with codable children: Secondary | ICD-10-CM | POA: Insufficient documentation

## 2012-09-25 DIAGNOSIS — J4489 Other specified chronic obstructive pulmonary disease: Secondary | ICD-10-CM | POA: Insufficient documentation

## 2012-09-25 DIAGNOSIS — J069 Acute upper respiratory infection, unspecified: Secondary | ICD-10-CM | POA: Insufficient documentation

## 2012-09-25 DIAGNOSIS — Z8781 Personal history of (healed) traumatic fracture: Secondary | ICD-10-CM | POA: Insufficient documentation

## 2012-09-25 DIAGNOSIS — M542 Cervicalgia: Secondary | ICD-10-CM | POA: Insufficient documentation

## 2012-09-25 DIAGNOSIS — R059 Cough, unspecified: Secondary | ICD-10-CM | POA: Insufficient documentation

## 2012-09-25 MED ORDER — BENZONATATE 100 MG PO CAPS
200.0000 mg | ORAL_CAPSULE | Freq: Once | ORAL | Status: AC
  Administered 2012-09-25: 200 mg via ORAL
  Filled 2012-09-25: qty 2

## 2012-09-25 MED ORDER — LORATADINE 10 MG PO TABS: 10.0000 mg | ORAL_TABLET | Freq: Every day | ORAL | Status: DC

## 2012-09-25 MED ORDER — LORATADINE 10 MG PO TABS
10.0000 mg | ORAL_TABLET | Freq: Once | ORAL | Status: AC
  Administered 2012-09-25: 10 mg via ORAL
  Filled 2012-09-25: qty 1

## 2012-09-25 NOTE — ED Notes (Signed)
Left ear pain. Cough, neck pain from coughing. Nonproductive cough.

## 2012-09-27 NOTE — ED Provider Notes (Signed)
Medical screening examination/treatment/procedure(s) were performed by non-physician practitioner and as supervising physician I was immediately available for consultation/collaboration. Amin Fornwalt, MD, FACEP   Jaevion Goto L Woodley Petzold, MD 09/27/12 0813 

## 2012-09-27 NOTE — ED Provider Notes (Addendum)
History     CSN: [id number]  Arrival date & time 09/25/12  1700   First MD Initiated Contact with Patient 09/25/12 1854      Chief Complaint  Patient presents with  . Otalgia    (Consider location/radiation/quality/duration/timing/severity/associated sxs/prior treatment) HPI Comments: Catherine Turner is a 57 y.o. Female presenting with left  ear ache,  Nonproductive cough,  Nasal congestion with clear nasal drainage which has been present for at least the past month despite treatment with two different courses of antibiotics.  She denies fevers or chills and has had no chest pain or shortness of breath,  No dizziness or tinnitus, and no drainage from her ear.  She has been taking mucinex DM for her nasal congestion,  Which works somewhat for congestion,  But continues to have nasal drainage along with post nasal drip causing mild chronic throat irritation.  She was prescribed tessalon perles by her pcp at [facility] earlier this week,  but has been unable to get this medicine filled.       The history is provided by the patient.    Past Medical History  Diagnosis Date  . Asthma   . COPD (chronic obstructive pulmonary disease)   . Arm fracture     right arm   . DJD (degenerative joint disease) of hip   . Thyroid disease     History reviewed. No pertinent past surgical history.  No family history on file.  History  Substance Use Topics  . Smoking status: Never Smoker   . Smokeless tobacco: Not on file  . Alcohol Use: No    OB History   Grav Para Term Preterm Abortions TAB SAB Ect Mult Living                  Review of Systems  Constitutional: Negative for fever.  HENT: Positive for ear pain, congestion and rhinorrhea. Negative for sore throat, facial swelling, neck pain, neck stiffness, tinnitus and ear discharge.   Eyes: Negative.   Respiratory: Positive for cough. Negative for chest tightness and shortness of breath.   Cardiovascular: Negative for chest  pain.  Gastrointestinal: Negative for nausea and abdominal pain.  Genitourinary: Negative.   Musculoskeletal: Negative for joint swelling and arthralgias.  Skin: Negative.  Negative for rash and wound.  Neurological: Negative for dizziness, weakness, light-headedness, numbness and headaches.  Psychiatric/Behavioral: Negative.     Allergies  Jasmine fragrance; Latex; Pork-derived products; and Sulfa antibiotics  Home Medications   Current Outpatient Rx  Name  Route  Sig  Dispense  Refill  . acetaminophen (TYLENOL) 650 MG CR tablet   Oral   Take 1,300 mg by mouth 3 (three) times daily.         Marland Kitchen albuterol (PROVENTIL) (2.5 MG/3ML) 0.083% nebulizer solution   Nebulization   Take 2.5 mg by nebulization every 6 (six) hours as needed for wheezing.         . Budesonide (PULMICORT TURBUHALER IN)   Inhalation   Inhale 2 puffs into the lungs daily as needed (for shortness of breath/wheezing).         Marland Kitchen dextromethorphan-guaiFENesin (MUCINEX DM) 30-600 MG per 12 hr tablet   Oral   Take 1 tablet by mouth daily as needed (for congestion).         . diphenhydrAMINE (BENADRYL) 25 MG tablet   Oral   Take 25 mg by mouth daily.         . Fluticasone-Salmeterol (ADVAIR) 100-50 MCG/DOSE  AEPB   Inhalation   Inhale 1 puff into the lungs every 12 (twelve) hours.         Marland Kitchen levothyroxine (SYNTHROID, LEVOTHROID) 100 MCG tablet   Oral   Take 100 mcg by mouth every morning.          Marland Kitchen lisinopril (PRINIVIL,ZESTRIL) 10 MG tablet   Oral   Take 10 mg by mouth every morning.         . Multiple Vitamin (MULTIVITAMIN WITH MINERALS) TABS   Oral   Take 1 tablet by mouth daily.         . benzonatate (TESSALON) 100 MG capsule   Oral   Take 100 mg by mouth daily as needed for cough.         . loratadine (CLARITIN) 10 MG tablet   Oral   Take 1 tablet (10 mg total) by mouth daily.   30 tablet   0     BP 106/57  Pulse 73  Temp(Src) 98.4 F (36.9 C) (Oral)  Resp 16  Ht 5'  7" (1.702 m)  Wt 307 lb (139.254 kg)  BMI 48.07 kg/m2  SpO2 99%  Physical Exam  Constitutional: She is oriented to person, place, and time. She appears well-developed and well-nourished.  HENT:  Head: Normocephalic and atraumatic.  Right Ear: Tympanic membrane and ear canal normal. Tympanic membrane is not injected, not retracted and not bulging.  Left Ear: Tympanic membrane and ear canal normal. Tympanic membrane is not injected, not retracted and not bulging.  Nose: Mucosal edema and rhinorrhea present.  Mouth/Throat: Uvula is midline, oropharynx is clear and moist and mucous membranes are normal. No oropharyngeal exudate, posterior oropharyngeal edema, posterior oropharyngeal erythema or tonsillar abscesses.  Eyes: Conjunctivae are normal.  Cardiovascular: Normal rate and normal heart sounds.   Pulmonary/Chest: Effort normal. No respiratory distress. She has no wheezes. She has no rales.  Abdominal: Soft. There is no tenderness.  Musculoskeletal: Normal range of motion.  Neurological: She is alert and oriented to person, place, and time.  Skin: Skin is warm and dry. No rash noted.  Psychiatric: She has a normal mood and affect.    ED Course  Procedures (including critical care time)  Labs Reviewed - No data to display No results found.   1. Acute URI       MDM  URI vs possible allergies.  She does state she is currently living with a friend as she is homeless. Friend uses lots of air freshener sprays which may be triggering her allergies.  She was given an advair disk by pcp  Has not started yet.  Pt encouraged to start this,  Also given tessalon dose here,  If this help the cough,  She should get her previous prescription filled.  Prescribed loratadine for nasal discharge.  Encouraged f/u with pcp.        Burgess Amor, PA 09/27/12 0115  Burgess Amor, PA 10/08/12 [phone]

## 2012-10-01 ENCOUNTER — Emergency Department (HOSPITAL_COMMUNITY): Payer: Self-pay

## 2012-10-01 ENCOUNTER — Emergency Department (HOSPITAL_COMMUNITY)
Admission: EM | Admit: 2012-10-01 | Discharge: 2012-10-01 | Disposition: A | Discharge status: Home / Self Care | Payer: Self-pay | Attending: Emergency Medicine | Admitting: Emergency Medicine

## 2012-10-01 ENCOUNTER — Encounter (HOSPITAL_COMMUNITY): Payer: Self-pay

## 2012-10-01 DIAGNOSIS — R1110 Vomiting, unspecified: Secondary | ICD-10-CM | POA: Insufficient documentation

## 2012-10-01 DIAGNOSIS — M1610 Unilateral primary osteoarthritis, unspecified hip: Secondary | ICD-10-CM | POA: Insufficient documentation

## 2012-10-01 DIAGNOSIS — R059 Cough, unspecified: Secondary | ICD-10-CM | POA: Insufficient documentation

## 2012-10-01 DIAGNOSIS — J3489 Other specified disorders of nose and nasal sinuses: Secondary | ICD-10-CM | POA: Insufficient documentation

## 2012-10-01 DIAGNOSIS — J40 Bronchitis, not specified as acute or chronic: Secondary | ICD-10-CM

## 2012-10-01 DIAGNOSIS — R0982 Postnasal drip: Secondary | ICD-10-CM | POA: Insufficient documentation

## 2012-10-01 DIAGNOSIS — E079 Disorder of thyroid, unspecified: Secondary | ICD-10-CM | POA: Insufficient documentation

## 2012-10-01 DIAGNOSIS — Z8781 Personal history of (healed) traumatic fracture: Secondary | ICD-10-CM | POA: Insufficient documentation

## 2012-10-01 DIAGNOSIS — J4489 Other specified chronic obstructive pulmonary disease: Secondary | ICD-10-CM | POA: Insufficient documentation

## 2012-10-01 DIAGNOSIS — Z79899 Other long term (current) drug therapy: Secondary | ICD-10-CM | POA: Insufficient documentation

## 2012-10-01 DIAGNOSIS — N898 Other specified noninflammatory disorders of vagina: Secondary | ICD-10-CM | POA: Insufficient documentation

## 2012-10-01 DIAGNOSIS — N390 Urinary tract infection, site not specified: Secondary | ICD-10-CM

## 2012-10-01 DIAGNOSIS — R300 Dysuria: Secondary | ICD-10-CM | POA: Insufficient documentation

## 2012-10-01 DIAGNOSIS — IMO0002 Reserved for concepts with insufficient information to code with codable children: Secondary | ICD-10-CM | POA: Insufficient documentation

## 2012-10-01 DIAGNOSIS — Z8739 Personal history of other diseases of the musculoskeletal system and connective tissue: Secondary | ICD-10-CM | POA: Insufficient documentation

## 2012-10-01 HISTORY — DX: Pain in unspecified hip: M25.559

## 2012-10-01 LAB — URINE MICROSCOPIC-ADD ON

## 2012-10-01 LAB — URINALYSIS, ROUTINE W REFLEX MICROSCOPIC
Glucose, UA: NEGATIVE mg/dL
Protein, ur: 30 mg/dL — AB
Specific Gravity, Urine: 1.02 (ref 1.005–1.030)
pH: 6 (ref 5.0–8.0)

## 2012-10-01 MED ORDER — CEPHALEXIN 500 MG PO CAPS: 500.0000 mg | ORAL_CAPSULE | Freq: Four times a day (QID) | ORAL | Status: DC

## 2012-10-01 MED ORDER — ALBUTEROL SULFATE HFA 108 (90 BASE) MCG/ACT IN AERS: 2.0000 | INHALATION_SPRAY | RESPIRATORY_TRACT | Status: DC | PRN

## 2012-10-01 MED ORDER — FLUTICASONE PROPIONATE 50 MCG/ACT NA SUSP: 2.0000 | Freq: Every day | NASAL | Status: DC

## 2012-10-01 NOTE — ED Notes (Signed)
Pt reports has was seen here Wed for cough.  Reports still has productive cough.  Also pt thinks has UTI because has noticed blood in urine since Thursday.  Also reports has been having some brown vaginal discharge.  Reports started after taking advair inhaler.

## 2012-10-01 NOTE — ED Provider Notes (Signed)
History     This chart was scribed for Glynn Octave, MD, MD by Smitty Pluck, ED Scribe. The patient was seen in room APA08/APA08 and the patient's care was started at 9:33 AM.   CSN: [id number]  Arrival date & time 10/01/12  0919       No chief complaint on file.   The history is provided by the patient and medical records. No language interpreter was used.   Catherine Turner is a 57 y.o. female with hx of asthma, COPD and DJD who presents to the Emergency Department complaining of constant, moderate productive cough and post nasal drainage that has been ongoing since August 2013 that worsened 2 days ago. She states that she had post tussive vomiting. She mentions that she was in ED 6 days ago for cough and left pain and given advair. She has not had relief after taking advair. Pt reports that she had brown vaginal discharge and hematuria onset 5 days ago. Pt denies fever, chills, diarrhea, dysuria, abdominal pain, weakness, cough, SOB and any other pain.   Pt denies smoking cigarettes.   Past Medical History  Diagnosis Date  . Asthma   . COPD (chronic obstructive pulmonary disease)   . Arm fracture     right arm   . DJD (degenerative joint disease) of hip   . Thyroid disease   . Hip pain     History reviewed. No pertinent past surgical history.  No family history on file.  History  Substance Use Topics  . Smoking status: Never Smoker   . Smokeless tobacco: Not on file  . Alcohol Use: No    OB History   Grav Para Term Preterm Abortions TAB SAB Ect Mult Living                  Review of Systems 10 Systems reviewed and all are negative for acute change except as noted in the HPI.   Allergies  Jasmine fragrance; Latex; Pork-derived products; and Sulfa antibiotics  Home Medications   Current Outpatient Rx  Name  Route  Sig  Dispense  Refill  . acetaminophen (TYLENOL) 650 MG CR tablet   Oral   Take 1,300 mg by mouth 3 (three) times daily.         .  benzonatate (TESSALON) 100 MG capsule   Oral   Take 100 mg by mouth daily as needed for cough.         . diphenhydrAMINE (BENADRYL) 25 MG tablet   Oral   Take 25 mg by mouth daily.         Marland Kitchen levothyroxine (SYNTHROID, LEVOTHROID) 100 MCG tablet   Oral   Take 100 mcg by mouth every morning.          Marland Kitchen lisinopril (PRINIVIL,ZESTRIL) 10 MG tablet   Oral   Take 10 mg by mouth every morning.         . loratadine (CLARITIN) 10 MG tablet   Oral   Take 1 tablet (10 mg total) by mouth daily.   30 tablet   0   . albuterol (PROVENTIL HFA;VENTOLIN HFA) 108 (90 BASE) MCG/ACT inhaler   Inhalation   Inhale 2 puffs into the lungs every 4 (four) hours as needed for wheezing.   1 Inhaler   0   . albuterol (PROVENTIL) (2.5 MG/3ML) 0.083% nebulizer solution   Nebulization   Take 2.5 mg by nebulization every 6 (six) hours as needed for wheezing.         Marland Kitchen  cephALEXin (KEFLEX) 500 MG capsule   Oral   Take 1 capsule (500 mg total) by mouth 4 (four) times daily.   40 capsule   0   . dextromethorphan-guaiFENesin (MUCINEX DM) 30-600 MG per 12 hr tablet   Oral   Take 1 tablet by mouth daily as needed (for congestion).         . fluticasone (FLONASE) 50 MCG/ACT nasal spray   Nasal   Place 2 sprays into the nose daily.   16 g   2   . Fluticasone-Salmeterol (ADVAIR) 100-50 MCG/DOSE AEPB   Inhalation   Inhale 1 puff into the lungs every 12 (twelve) hours.         . Multiple Vitamin (MULTIVITAMIN WITH MINERALS) TABS   Oral   Take 1 tablet by mouth daily.           BP 115/79  Pulse 103  Temp(Src) 99.6 F (37.6 C) (Oral)  Resp 20  Ht 5\' 7"  (1.702 m)  Wt 301 lb (136.533 kg)  BMI 47.13 kg/m2  SpO2 96%  Physical Exam  Nursing note and vitals reviewed. Constitutional: She is oriented to person, place, and time. She appears well-developed and well-nourished. No distress.  HENT:  Head: Normocephalic and atraumatic.  Right Ear: External ear normal.  Left Ear: External  ear normal.  Post nasal drip  Eyes: EOM are normal. Pupils are equal, round, and reactive to light.  Neck: Normal range of motion. Neck supple. No tracheal deviation present.  Cardiovascular: Normal rate, regular rhythm and normal heart sounds.   Pulmonary/Chest: Effort normal and breath sounds normal. No respiratory distress. She has no wheezes.  Speaking in clear sentences  Abdominal: Soft. She exhibits no distension. There is no tenderness. There is no CVA tenderness.  Obese abdomen  Genitourinary:   Scant white discharge No CMT No adnexal tenderness   Musculoskeletal: Normal range of motion.  Neurological: She is alert and oriented to person, place, and time.  Skin: Skin is warm and dry.  Psychiatric: She has a normal mood and affect. Her behavior is normal.    ED Course  Procedures (including critical care time) DIAGNOSTIC STUDIES: Oxygen Saturation is 96% on room air, adequate by my interpretation.    COORDINATION OF CARE: 9:40 AM Discussed ED treatment with pt and pt agrees.  12:16 PM Recheck: Discussed lab results and treatment course with pt. Pt is feeling better. Pt is ready for discharge.     Labs Reviewed  URINALYSIS, ROUTINE W REFLEX MICROSCOPIC - Abnormal; Notable for the following:    Hgb urine dipstick SMALL (*)    Bilirubin Urine SMALL (*)    Protein, ur 30 (*)    Leukocytes, UA MODERATE (*)    All other components within normal limits  URINE MICROSCOPIC-ADD ON - Abnormal; Notable for the following:    Bacteria, UA MANY (*)    All other components within normal limits  URINE CULTURE   Ct Abdomen Pelvis Wo Contrast  10/01/2012  *RADIOLOGY REPORT*  Clinical Data: Left flank pain, history asthma and COPD  CT ABDOMEN AND PELVIS WITHOUT CONTRAST  Technique:  Multidetector CT imaging of the abdomen and pelvis was performed following the standard protocol without intravenous contrast. Oral contrast not administered.  Sagittal and coronal MPR images reconstructed  from axial data set.  Comparison: None  Findings: Lung bases clear. Within limits of a nonenhanced exam no focal abnormalities of the liver, spleen, pancreas, kidneys, or adrenal glands identified. Spleen is enlarged, 14.6  x 9.9 x 15.4 cm. No urinary tract calcification or dilatation. Bladder decompressed. Uterus and adnexae normal appearance.  Umbilical hernia containing fat. Normal appendix. Stomach and bowel loops normal appearance. No mass, adenopathy, free fluid or inflammatory process. Mild scattered degenerative disc and facet disease changes lumbar spine.  IMPRESSION: Umbilical hernia containing fat. Splenomegaly. No acute intra abdominal intrapelvic abnormalities identified.   Original Report Authenticated By: Ulyses Southward, M.D.    Dg Chest 2 View  10/01/2012  *RADIOLOGY REPORT*  Clinical Data: Cough for 5 months  CHEST - 2 VIEW  Comparison: Chest radiograph 04/12/2012  Findings: Stable cardiac silhouette.  No effusion, infiltrate, or pneumothorax. Degenerative osteophytosis of the thoracic spine.  IMPRESSION: No acute cardiopulmonary process.   Original Report Authenticated By: Genevive Bi, M.D.      1. Urinary tract infection   2. Bronchitis       MDM  Left earache, nonproductive cough, clear nasal drainage for the past several months. Seen one week ago for similar symptoms. Taking Mucinex, Tessalon. Reports dysuria, hematuria, brown vaginal discharge over the past several days. No chest pain or SOB. Low suspicion for ACS or PE. Viral syndrome with postnasal drip, cough, ear pain. Will treat UTI with keflex which should cover possible bronchitis as well. Continue loratidine and add flonase.    I personally performed the services described in this documentation, which was scribed in my presence. The recorded information has been reviewed and is accurate.    Glynn Octave, MD 10/01/12 1740

## 2012-10-03 LAB — URINE CULTURE

## 2012-10-04 ENCOUNTER — Telehealth (HOSPITAL_COMMUNITY): Payer: Self-pay | Admitting: Emergency Medicine

## 2012-10-04 NOTE — ED Notes (Signed)
+  Urine. Patient treated with Keflex. Sensitive to same. Per protocol MD. °

## 2012-10-04 NOTE — ED Notes (Signed)
Patient has +Urine culture. Checking to see if appropriately treated. °

## 2012-10-09 NOTE — ED Provider Notes (Signed)
Medical screening examination/treatment/procedure(s) were performed by non-physician practitioner and as supervising physician I was immediately available for consultation/collaboration. Devoria Albe, MD, Armando Gang   Ward Givens, MD 10/09/12 [phone]

## 2012-11-05 ENCOUNTER — Telehealth: Payer: Self-pay | Admitting: Psychology

## 2012-11-05 NOTE — Telephone Encounter (Signed)
Patient called and left a VM requesting a therapist that does "psychodynamic therapy."  She is not an [facility] patient and I am not taking outside referrals at this time.  Additionally, I do not do psychodynamic therapy.  I called her and left a VM asking her to call me back.

## 2012-11-26 ENCOUNTER — Ambulatory Visit (HOSPITAL_COMMUNITY): Payer: Self-pay | Admitting: Psychology

## 2012-11-29 ENCOUNTER — Emergency Department (HOSPITAL_COMMUNITY)
Admission: EM | Admit: 2012-11-29 | Discharge: 2012-11-29 | Disposition: A | Discharge status: Home / Self Care | Payer: Self-pay | Attending: Emergency Medicine | Admitting: Emergency Medicine

## 2012-11-29 ENCOUNTER — Emergency Department (HOSPITAL_COMMUNITY): Payer: Self-pay

## 2012-11-29 ENCOUNTER — Encounter (HOSPITAL_COMMUNITY): Payer: Self-pay | Admitting: *Deleted

## 2012-11-29 DIAGNOSIS — IMO0002 Reserved for concepts with insufficient information to code with codable children: Secondary | ICD-10-CM | POA: Insufficient documentation

## 2012-11-29 DIAGNOSIS — Z8781 Personal history of (healed) traumatic fracture: Secondary | ICD-10-CM | POA: Insufficient documentation

## 2012-11-29 DIAGNOSIS — Z79899 Other long term (current) drug therapy: Secondary | ICD-10-CM | POA: Insufficient documentation

## 2012-11-29 DIAGNOSIS — R059 Cough, unspecified: Secondary | ICD-10-CM | POA: Insufficient documentation

## 2012-11-29 DIAGNOSIS — E079 Disorder of thyroid, unspecified: Secondary | ICD-10-CM | POA: Insufficient documentation

## 2012-11-29 DIAGNOSIS — Z8739 Personal history of other diseases of the musculoskeletal system and connective tissue: Secondary | ICD-10-CM | POA: Insufficient documentation

## 2012-11-29 DIAGNOSIS — R05 Cough: Secondary | ICD-10-CM | POA: Insufficient documentation

## 2012-11-29 DIAGNOSIS — J3489 Other specified disorders of nose and nasal sinuses: Secondary | ICD-10-CM | POA: Insufficient documentation

## 2012-11-29 DIAGNOSIS — J441 Chronic obstructive pulmonary disease with (acute) exacerbation: Secondary | ICD-10-CM | POA: Insufficient documentation

## 2012-11-29 DIAGNOSIS — J45901 Unspecified asthma with (acute) exacerbation: Secondary | ICD-10-CM

## 2012-11-29 DIAGNOSIS — Z91040 Latex allergy status: Secondary | ICD-10-CM | POA: Insufficient documentation

## 2012-11-29 MED ORDER — ALBUTEROL SULFATE (5 MG/ML) 0.5% IN NEBU
5.0000 mg | INHALATION_SOLUTION | Freq: Once | RESPIRATORY_TRACT | Status: AC
  Administered 2012-11-29: 5 mg via RESPIRATORY_TRACT
  Filled 2012-11-29: qty 1

## 2012-11-29 MED ORDER — PREDNISONE 10 MG PO TABS: ORAL_TABLET | ORAL | Status: DC

## 2012-11-29 NOTE — ED Notes (Signed)
Patient with no complaints at this time. Respirations even and unlabored. Skin warm/dry. Discharge instructions reviewed with patient at this time. Patient given opportunity to voice concerns/ask questions. Patient discharged at this time and left Emergency Department with steady gait.   

## 2012-11-29 NOTE — ED Notes (Signed)
Pt states asthma flared up at midnight last night. NAD.

## 2012-11-30 NOTE — ED Provider Notes (Signed)
History     CSN: [id number]  Arrival date & time 11/29/12  0815   First MD Initiated Contact with Patient 11/29/12 [phone]      Chief Complaint  Patient presents with  . Asthma    (Consider location/radiation/quality/duration/timing/severity/associated sxs/prior treatment) HPI Comments: Patient with hx of asthma c/o "a flare up" of her asthma that began two days ago after she became upset.  States she has also been coughing frequently and has sometimes been productive.  She states her symptoms improved after using albuterol and inhaled steroids.  She denies chest pain, fever, abd pain, vomiting or increased shortness of breath.  States her symptoms are similar to previous and she believes her asthma is exacerbated by anxiety.    Patient is a 57 y.o. female presenting with asthma. The history is provided by the patient.  Asthma This is a recurrent problem. The current episode started yesterday. The problem occurs intermittently. The problem has been gradually improving. Associated symptoms include congestion and coughing. Pertinent negatives include no abdominal pain, arthralgias, chest pain, chills, diaphoresis, fever, headaches, joint swelling, myalgias, nausea, neck pain, numbness, rash, sore throat, swollen glands, urinary symptoms, vertigo, visual change, vomiting or weakness. The symptoms are aggravated by stress and coughing. Treatments tried: inhaled steriods and albuterol. The treatment provided moderate relief.    Past Medical History  Diagnosis Date  . Asthma   . COPD (chronic obstructive pulmonary disease)   . Arm fracture     right arm   . DJD (degenerative joint disease) of hip   . Thyroid disease   . Hip pain     History reviewed. No pertinent past surgical history.  No family history on file.  History  Substance Use Topics  . Smoking status: Never Smoker   . Smokeless tobacco: Not on file  . Alcohol Use: No    OB History   Grav Para Term Preterm Abortions TAB  SAB Ect Mult Living                  Review of Systems  Constitutional: Negative for fever, chills and diaphoresis.  HENT: Positive for congestion. Negative for sore throat, trouble swallowing and neck pain.   Respiratory: Positive for cough, shortness of breath and wheezing. Negative for chest tightness.   Cardiovascular: Negative for chest pain.  Gastrointestinal: Negative for nausea, vomiting and abdominal pain.  Genitourinary: Negative for dysuria.  Musculoskeletal: Negative for myalgias, joint swelling and arthralgias.  Skin: Negative for rash.  Neurological: Negative for vertigo, weakness, numbness and headaches.  All other systems reviewed and are negative.    Allergies  Jasmine fragrance; Advair diskus; Lactase; Pravastatin; Latex; Pork-derived products; and Sulfa antibiotics  Home Medications   Current Outpatient Rx  Name  Route  Sig  Dispense  Refill  . acetaminophen (TYLENOL) 650 MG CR tablet   Oral   Take 1,300 mg by mouth 3 (three) times daily.         Marland Kitchen albuterol (PROVENTIL HFA;VENTOLIN HFA) 108 (90 BASE) MCG/ACT inhaler   Inhalation   Inhale 2 puffs into the lungs every 4 (four) hours as needed for wheezing.   1 Inhaler   0   . albuterol (PROVENTIL) (2.5 MG/3ML) 0.083% nebulizer solution   Nebulization   Take 2.5 mg by nebulization every 6 (six) hours as needed for wheezing.         . benzonatate (TESSALON) 100 MG capsule   Oral   Take 100 mg by mouth daily as needed for  cough.         . dextromethorphan-guaiFENesin (MUCINEX DM) 30-600 MG per 12 hr tablet   Oral   Take 1 tablet by mouth daily as needed (for congestion).         . diphenhydrAMINE (BENADRYL) 25 MG tablet   Oral   Take 25 mg by mouth daily.         . fluticasone (FLONASE) 50 MCG/ACT nasal spray   Nasal   Place 2 sprays into the nose daily.   16 g   2   . fluticasone (FLOVENT HFA) 44 MCG/ACT inhaler   Inhalation   Inhale 1 puff into the lungs 2 (two) times daily.          Marland Kitchen HYDROcodone-acetaminophen (NORCO/VICODIN) 5-325 MG per tablet   Oral   Take 1 tablet by mouth 2 (two) times daily as needed for pain.         Marland Kitchen levothyroxine (SYNTHROID, LEVOTHROID) 100 MCG tablet   Oral   Take 100 mcg by mouth every morning.          Marland Kitchen lisinopril (PRINIVIL,ZESTRIL) 10 MG tablet   Oral   Take 10 mg by mouth every morning.         . loratadine (CLARITIN) 10 MG tablet   Oral   Take 1 tablet (10 mg total) by mouth daily.   30 tablet   0   . Multiple Vitamin (MULTIVITAMIN WITH MINERALS) TABS   Oral   Take 1 tablet by mouth daily.         . naproxen (NAPROSYN) 500 MG tablet   Oral   Take 500 mg by mouth 2 (two) times daily with a meal.         . predniSONE (DELTASONE) 10 MG tablet      Take 6 tablets day one, 5 tablets day two, 4 tablets day three, 3 tablets day four, 2 tablets day five, then 1 tablet day six   21 tablet   0     BP 99/58  Pulse 74  Temp(Src) 98 F (36.7 C) (Oral)  Resp 19  Ht 5\' 7"  (1.702 m)  Wt 301 lb (136.533 kg)  BMI 47.13 kg/m2  SpO2 99%  Physical Exam  Nursing note and vitals reviewed. Constitutional: She is oriented to person, place, and time. She appears well-developed and well-nourished. No distress.  Patient is well appearing  HENT:  Head: Normocephalic and atraumatic.  Right Ear: Tympanic membrane and ear canal normal.  Left Ear: Tympanic membrane and ear canal normal.  Mouth/Throat: Uvula is midline, oropharynx is clear and moist and mucous membranes are normal. No oropharyngeal exudate.  Eyes: EOM are normal. Pupils are equal, round, and reactive to light.  Neck: Normal range of motion. Neck supple.  Cardiovascular: Normal rate, regular rhythm, normal heart sounds and intact distal pulses.   No murmur heard. Pulmonary/Chest: Effort normal and breath sounds normal. No respiratory distress. She has no wheezes. She has no rales. She exhibits no tenderness.  Coarse lungs sounds bilaterally.  No wheezing or  rales  Abdominal: Soft. She exhibits no distension. There is no tenderness. There is no rebound.  Musculoskeletal: She exhibits no edema.  Lymphadenopathy:    She has no cervical adenopathy.  Neurological: She is alert and oriented to person, place, and time. She exhibits normal muscle tone. Coordination normal.  Skin: Skin is warm and dry.    ED Course  Procedures (including critical care time)  Labs Reviewed - No data  to display Dg Chest 2 View  11/29/2012  *RADIOLOGY REPORT*  Clinical Data: Asthma  CHEST - 2 VIEW  Comparison: 10/01/2012  Findings: Upper normal heart size.  Clear lungs.  No pleural effusion and no pneumothorax.  IMPRESSION: No active cardiopulmonary disease.   Original Report Authenticated By: Jolaine Click, M.D.      1. Asthma flare       MDM    Patient is well appearing, lung sounds are CTA bilaterally. VSS.  No tachycardia, tachypnea or hypoxia.  Doubt PE.   Patient agrees to close f/u with her PMD.  Was only using her albuterol twice daily.  Advised her to use her albuterol neb every 4-6 hrs as needed.  She agrees to return to here if needed.  She is feeling better and appears stable for discharge.       The patient appears reasonably screened and/or stabilized for discharge and I doubt any other medical condition or other [facility] requiring further screening, evaluation, or treatment in the ED at this time prior to discharge.    Colbie Sliker L. Trisha Mangle, PA-C 11/30/12 2022

## 2012-12-03 NOTE — ED Provider Notes (Signed)
Medical screening examination/treatment/procedure(s) were performed by non-physician practitioner and as supervising physician I was immediately available for consultation/collaboration.    Demani Weyrauch W. Chesnie Capell, MD 12/03/12 1518 

## 2012-12-10 ENCOUNTER — Ambulatory Visit (INDEPENDENT_AMBULATORY_CARE_PROVIDER_SITE_OTHER): Payer: Self-pay | Admitting: Psychology

## 2012-12-10 DIAGNOSIS — F4312 Post-traumatic stress disorder, chronic: Secondary | ICD-10-CM

## 2012-12-10 DIAGNOSIS — F4310 Post-traumatic stress disorder, unspecified: Secondary | ICD-10-CM

## 2012-12-10 DIAGNOSIS — F411 Generalized anxiety disorder: Secondary | ICD-10-CM

## 2012-12-10 DIAGNOSIS — F329 Major depressive disorder, single episode, unspecified: Secondary | ICD-10-CM

## 2012-12-18 ENCOUNTER — Ambulatory Visit (INDEPENDENT_AMBULATORY_CARE_PROVIDER_SITE_OTHER): Payer: Self-pay | Admitting: Psychology

## 2012-12-18 DIAGNOSIS — F411 Generalized anxiety disorder: Secondary | ICD-10-CM

## 2012-12-18 DIAGNOSIS — F329 Major depressive disorder, single episode, unspecified: Secondary | ICD-10-CM

## 2012-12-18 DIAGNOSIS — F4310 Post-traumatic stress disorder, unspecified: Secondary | ICD-10-CM

## 2012-12-18 DIAGNOSIS — F4312 Post-traumatic stress disorder, chronic: Secondary | ICD-10-CM

## 2013-01-02 ENCOUNTER — Ambulatory Visit (HOSPITAL_COMMUNITY)
Admission: RE | Admit: 2013-01-02 | Discharge: 2013-01-02 | Disposition: A | Discharge status: Home / Self Care | Payer: Disability Insurance | Source: Ambulatory Visit | Attending: Psychology | Admitting: Psychology

## 2013-01-02 ENCOUNTER — Ambulatory Visit (INDEPENDENT_AMBULATORY_CARE_PROVIDER_SITE_OTHER): Payer: Self-pay | Admitting: Psychology

## 2013-01-02 ENCOUNTER — Other Ambulatory Visit (HOSPITAL_COMMUNITY): Payer: Self-pay | Admitting: *Deleted

## 2013-01-02 DIAGNOSIS — M1990 Unspecified osteoarthritis, unspecified site: Secondary | ICD-10-CM

## 2013-01-02 DIAGNOSIS — F411 Generalized anxiety disorder: Secondary | ICD-10-CM

## 2013-01-02 DIAGNOSIS — F329 Major depressive disorder, single episode, unspecified: Secondary | ICD-10-CM

## 2013-01-02 DIAGNOSIS — F4312 Post-traumatic stress disorder, chronic: Secondary | ICD-10-CM

## 2013-01-02 DIAGNOSIS — M25559 Pain in unspecified hip: Secondary | ICD-10-CM | POA: Insufficient documentation

## 2013-01-02 DIAGNOSIS — F3289 Other specified depressive episodes: Secondary | ICD-10-CM

## 2013-01-02 DIAGNOSIS — F4310 Post-traumatic stress disorder, unspecified: Secondary | ICD-10-CM

## 2013-01-03 ENCOUNTER — Encounter (HOSPITAL_COMMUNITY): Payer: Self-pay | Admitting: Psychology
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
## 2013-01-06 ENCOUNTER — Encounter (HOSPITAL_COMMUNITY): Payer: Self-pay | Admitting: Psychology
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
## 2013-01-13 ENCOUNTER — Encounter (HOSPITAL_COMMUNITY): Payer: Self-pay | Admitting: Psychiatry

## 2013-01-13 ENCOUNTER — Encounter (HOSPITAL_COMMUNITY): Payer: Self-pay | Admitting: Psychology

## 2013-01-13 ENCOUNTER — Ambulatory Visit (HOSPITAL_COMMUNITY): Payer: Self-pay | Admitting: Psychiatry

## 2013-01-13 ENCOUNTER — Ambulatory Visit (INDEPENDENT_AMBULATORY_CARE_PROVIDER_SITE_OTHER): Payer: Self-pay | Admitting: Psychiatry

## 2013-01-13 VITALS — BP 147/76 | HR 67 | Ht 64.75 in | Wt 314.6 lb

## 2013-01-13 DIAGNOSIS — F489 Nonpsychotic mental disorder, unspecified: Secondary | ICD-10-CM

## 2013-01-13 DIAGNOSIS — F4310 Post-traumatic stress disorder, unspecified: Secondary | ICD-10-CM | POA: Insufficient documentation

## 2013-01-13 DIAGNOSIS — F5105 Insomnia due to other mental disorder: Secondary | ICD-10-CM | POA: Insufficient documentation

## 2013-01-13 DIAGNOSIS — F418 Other specified anxiety disorders: Secondary | ICD-10-CM

## 2013-01-13 DIAGNOSIS — M79601 Pain in right arm: Secondary | ICD-10-CM

## 2013-01-13 DIAGNOSIS — M79609 Pain in unspecified limb: Secondary | ICD-10-CM

## 2013-01-13 DIAGNOSIS — M79603 Pain in arm, unspecified: Secondary | ICD-10-CM | POA: Insufficient documentation

## 2013-01-13 DIAGNOSIS — F411 Generalized anxiety disorder: Secondary | ICD-10-CM | POA: Insufficient documentation

## 2013-01-13 DIAGNOSIS — F341 Dysthymic disorder: Secondary | ICD-10-CM

## 2013-01-13 MED ORDER — GABAPENTIN 100 MG PO CAPS: 100.0000 mg | ORAL_CAPSULE | Freq: Three times a day (TID) | ORAL | Status: DC
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
## 2013-01-13 NOTE — Patient Instructions (Addendum)
CUT BACK/CUT OUT on sugar and carbohydrates, that means very limited fruits and starchy vegetables and very limited grains, breads  The goal is low GLYCEMIC INDEX.  CUT OUT all wheat, rye, or barley for the GLUTEN in them.  HIGH fat and LOW carbohydrate diet is the KEY.  Eat avocados, eggs, lean meat like grass fed beef and chicken  Nuts and seeds would be good foods as well.   Stevia is an excellent sweetener.  Safe for the brain.   [name] is also a good safe sweetener, not the baking blend form of Truvia  Almond butter is awesome.  Check out all this on the Internet.  Dr Heber Uehling is on the Internet with some good info about this.   http://www.drperlmutter.com is where that is.  An excellent site for info on this diet is http://paleoleap.com  [employer] makes dark chocolate that is sweetened with Stevia that is safe.  [name] is a soda sweetened with Stevia and is available at [employer] among other places.  Set a timer for 8 or a certain number minutes and walk for that amount of time in the house or in the yard.  Mark the number of minutes on a calendar for that day.  Do that every day this week.  Then next week increase the time by 1 minutes and then mark the calendar with the number of minutes for that day.  Each week increase your exercise by one minute.  Keep a record of this so you can see the progress you are making.  Do this every day, just like eating and sleeping.  It is good for pain control, depression, and for your soul/spirit.  Bring the record in for your next visit so we can talk about your effort and how you feel with the new exercise program going and working for you.  Relaxation is the ultimate solution for you.  You can seek it through tub baths, bubble baths, essential oils or incense, walking or chatting with friends, listening to soft music, watching a candle burn and just letting all thoughts go and appreciating the true essence of the Creator.  Pets  or animals may be very helpful.  You might spend some time with them and then go do more directed meditation.  Call [facility] pharmacy about coverage under the indigent care program for the Neurontion.  [phone].  Call if problems or concerns.
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
## 2013-01-14 ENCOUNTER — Telehealth (HOSPITAL_COMMUNITY): Payer: Self-pay | Admitting: Psychiatry

## 2013-01-14 DIAGNOSIS — F418 Other specified anxiety disorders: Secondary | ICD-10-CM

## 2013-01-14 DIAGNOSIS — M79601 Pain in right arm: Secondary | ICD-10-CM

## 2013-01-14 DIAGNOSIS — F5105 Insomnia due to other mental disorder: Secondary | ICD-10-CM

## 2013-01-14 DIAGNOSIS — F411 Generalized anxiety disorder: Secondary | ICD-10-CM

## 2013-01-14 DIAGNOSIS — F4310 Post-traumatic stress disorder, unspecified: Secondary | ICD-10-CM

## 2013-01-15 ENCOUNTER — Ambulatory Visit (INDEPENDENT_AMBULATORY_CARE_PROVIDER_SITE_OTHER): Payer: Self-pay | Admitting: Psychology

## 2013-01-15 DIAGNOSIS — F4312 Post-traumatic stress disorder, chronic: Secondary | ICD-10-CM

## 2013-01-15 DIAGNOSIS — F4310 Post-traumatic stress disorder, unspecified: Secondary | ICD-10-CM

## 2013-01-15 DIAGNOSIS — F329 Major depressive disorder, single episode, unspecified: Secondary | ICD-10-CM

## 2013-01-15 MED ORDER — GABAPENTIN 100 MG PO CAPS: 100.0000 mg | ORAL_CAPSULE | Freq: Three times a day (TID) | ORAL | Status: DC

## 2013-01-15 NOTE — Telephone Encounter (Signed)
Sent message via eScript to pharmacy that the 800 number faxed over the other day is for pharmacy only and not for anyone associated with the prescriber.

## 2013-01-27 ENCOUNTER — Encounter (HOSPITAL_COMMUNITY): Payer: Self-pay | Admitting: Psychiatry

## 2013-01-27 ENCOUNTER — Ambulatory Visit (INDEPENDENT_AMBULATORY_CARE_PROVIDER_SITE_OTHER): Payer: Self-pay | Admitting: Psychiatry

## 2013-01-27 VITALS — BP 140/90 | Ht 65.0 in | Wt 317.2 lb

## 2013-01-27 DIAGNOSIS — F411 Generalized anxiety disorder: Secondary | ICD-10-CM

## 2013-01-27 DIAGNOSIS — M79601 Pain in right arm: Secondary | ICD-10-CM

## 2013-01-27 DIAGNOSIS — F4310 Post-traumatic stress disorder, unspecified: Secondary | ICD-10-CM

## 2013-01-27 DIAGNOSIS — F418 Other specified anxiety disorders: Secondary | ICD-10-CM
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
## 2013-01-27 NOTE — Patient Instructions (Signed)
In keeping with [facility]'s "BOLD NEW FUTURE" it would be very healthy for you to  CUT BACK/CUT OUT on sugar and carbohydrates, that means very limited fruits and starchy vegetables and very limited grains, breads  The goal is low GLYCEMIC INDEX.  CUT OUT all wheat, rye, or barley for the GLUTEN in them.  HIGH fat and LOW carbohydrate diet is the KEY.  Eat avocados, eggs, lean meat like grass fed beef and chicken  Nuts and seeds would be good foods as well.   Stevia is an excellent sweetener.  Safe for the brain.   Truvia is also a good safe sweetener, not the baking blend form of Truvia  Almond butter is awesome.  Check out all this on the Internet.  Dr Purlmutter is on the Internet with some good info about this.   http://www.drperlmutter.com is where that is.  An excellent site for info on this diet is http://paleoleap.com  [employer] makes dark chocolate that is sweetened with Stevia that is safe.  Zevia is a soda sweetened with Stevia and is available at [employer] among other places.  Call if problems or concerns.  

## 2013-02-05 ENCOUNTER — Ambulatory Visit (INDEPENDENT_AMBULATORY_CARE_PROVIDER_SITE_OTHER): Payer: Self-pay | Admitting: Psychology

## 2013-02-05 DIAGNOSIS — F329 Major depressive disorder, single episode, unspecified: Secondary | ICD-10-CM

## 2013-02-05 DIAGNOSIS — F4312 Post-traumatic stress disorder, chronic: Secondary | ICD-10-CM

## 2013-02-05 DIAGNOSIS — F4310 Post-traumatic stress disorder, unspecified: Secondary | ICD-10-CM

## 2013-02-18 ENCOUNTER — Encounter (HOSPITAL_COMMUNITY): Payer: Self-pay | Admitting: Psychology
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
## 2013-02-19 ENCOUNTER — Encounter (HOSPITAL_COMMUNITY): Payer: Self-pay | Admitting: Psychology

## 2013-02-19 ENCOUNTER — Emergency Department (HOSPITAL_COMMUNITY): Payer: Self-pay

## 2013-02-19 ENCOUNTER — Ambulatory Visit (INDEPENDENT_AMBULATORY_CARE_PROVIDER_SITE_OTHER): Payer: Self-pay | Admitting: Psychology

## 2013-02-19 ENCOUNTER — Emergency Department (HOSPITAL_COMMUNITY)
Admission: EM | Admit: 2013-02-19 | Discharge: 2013-02-19 | Disposition: A | Discharge status: Home / Self Care | Payer: Self-pay | Attending: Emergency Medicine | Admitting: Emergency Medicine

## 2013-02-19 ENCOUNTER — Encounter (HOSPITAL_COMMUNITY): Payer: Self-pay | Admitting: *Deleted

## 2013-02-19 DIAGNOSIS — J449 Chronic obstructive pulmonary disease, unspecified: Secondary | ICD-10-CM | POA: Insufficient documentation

## 2013-02-19 DIAGNOSIS — J45909 Unspecified asthma, uncomplicated: Secondary | ICD-10-CM | POA: Insufficient documentation

## 2013-02-19 DIAGNOSIS — J4489 Other specified chronic obstructive pulmonary disease: Secondary | ICD-10-CM | POA: Insufficient documentation

## 2013-02-19 DIAGNOSIS — Z8659 Personal history of other mental and behavioral disorders: Secondary | ICD-10-CM | POA: Insufficient documentation

## 2013-02-19 DIAGNOSIS — Z91040 Latex allergy status: Secondary | ICD-10-CM | POA: Insufficient documentation

## 2013-02-19 DIAGNOSIS — F4312 Post-traumatic stress disorder, chronic: Secondary | ICD-10-CM

## 2013-02-19 DIAGNOSIS — M1610 Unilateral primary osteoarthritis, unspecified hip: Secondary | ICD-10-CM | POA: Insufficient documentation

## 2013-02-19 DIAGNOSIS — Z791 Long term (current) use of non-steroidal anti-inflammatories (NSAID): Secondary | ICD-10-CM | POA: Insufficient documentation

## 2013-02-19 DIAGNOSIS — M169 Osteoarthritis of hip, unspecified: Secondary | ICD-10-CM | POA: Insufficient documentation

## 2013-02-19 DIAGNOSIS — Z8781 Personal history of (healed) traumatic fracture: Secondary | ICD-10-CM | POA: Insufficient documentation

## 2013-02-19 DIAGNOSIS — IMO0002 Reserved for concepts with insufficient information to code with codable children: Secondary | ICD-10-CM | POA: Insufficient documentation

## 2013-02-19 DIAGNOSIS — J40 Bronchitis, not specified as acute or chronic: Secondary | ICD-10-CM | POA: Insufficient documentation

## 2013-02-19 DIAGNOSIS — Z79899 Other long term (current) drug therapy: Secondary | ICD-10-CM | POA: Insufficient documentation

## 2013-02-19 DIAGNOSIS — E079 Disorder of thyroid, unspecified: Secondary | ICD-10-CM | POA: Insufficient documentation

## 2013-02-19 DIAGNOSIS — F329 Major depressive disorder, single episode, unspecified: Secondary | ICD-10-CM

## 2013-02-19 DIAGNOSIS — R0602 Shortness of breath: Secondary | ICD-10-CM | POA: Insufficient documentation

## 2013-02-19 DIAGNOSIS — F4310 Post-traumatic stress disorder, unspecified: Secondary | ICD-10-CM

## 2013-02-19 HISTORY — DX: Insomnia, unspecified: G47.00

## 2013-02-19 MED ORDER — HYDROCODONE-ACETAMINOPHEN 7.5-325 MG/15ML PO SOLN: 10.0000 mL | Freq: Four times a day (QID) | ORAL | Status: DC | PRN

## 2013-02-19 MED ORDER — PREDNISONE 50 MG PO TABS
60.0000 mg | ORAL_TABLET | Freq: Once | ORAL | Status: AC
  Administered 2013-02-19: 60 mg via ORAL
  Filled 2013-02-19: qty 1

## 2013-02-19 MED ORDER — PREDNISONE 20 MG PO TABS: 60.0000 mg | ORAL_TABLET | Freq: Every day | ORAL | Status: DC
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
## 2013-02-19 NOTE — ED Provider Notes (Signed)
History  This chart was scribed for Catherine B. Bernette Mayers, MD by Bennett Scrape, ED Scribe. This patient was seen in room APA01/APA01 and the patient's care was started at 10:09 AM.  CSN: [id number] Arrival date & time 02/19/13  4098  First MD Initiated Contact with Patient 02/19/13 1009     Chief Complaint  Patient presents with  . Cough    The history is provided by the patient. No language interpreter was used.    HPI Comments: Catherine Turner is a 57 y.o. female with a h/o asthma who presents to the Emergency Department complaining of one week of cough described as dry and hacking that became worse 4 days ago. She admits that she has experienced associated SOB during coughing fits. She states that the cough has been mildly improved with a sleep aid that she uses for insomnia, inhalers, Tessalon Perles and Tessalon DM. She reports that the cough has been a recurrent problem for her, but she came in for evaluation when she started having a "metallic taste" with the coughing. Last dose of steroids was in April 2014. She is prescribed hydrocodone for arm pain but denies taking it due to it not improving her pain. She denies any recent fevers and CP as associated symptoms.   PCP is with [facility].  Past Medical History  Diagnosis Date  . Asthma   . COPD (chronic obstructive pulmonary disease)   . Arm fracture     right arm   . DJD (degenerative joint disease) of hip   . Thyroid disease   . Hip pain   . PTSD (post-traumatic stress disorder)   . Insomnia    History reviewed. No pertinent past surgical history. Family History  Problem Relation Age of Onset  . COPD Mother   . Cancer - Lung Mother   . Hypertension Father   . Drug abuse Sister   . Dementia Maternal Aunt   . Heart disease Maternal Grandfather   . Heart disease Maternal Grandmother   . Heart disease Paternal Grandfather   . Heart attack Paternal Grandfather   . Heart disease Paternal Grandmother   . ADD / ADHD Neg  Hx   . Bipolar disorder Neg Hx   . OCD Neg Hx   . Paranoid behavior Neg Hx   . Schizophrenia Neg Hx   . Seizures Neg Hx   . Sexual abuse Neg Hx   . Physical abuse Other   . Anxiety disorder Other   . Birth defects Other     Catherine Turner  . Alcohol abuse Paternal Aunt   . Alcohol abuse Paternal Uncle    History  Substance Use Topics  . Smoking status: Passive Smoke Exposure - Never Smoker  . Smokeless tobacco: Never Used  . Alcohol Use: No   No OB history provided.   Review of Systems  Constitutional: Negative for fever.  Respiratory: Positive for cough and shortness of breath.   Cardiovascular: Negative for chest pain.    Allergies  Advair diskus; Jasmine fragrance; Diclofenac; Lactase; Pravastatin; Latex; Pork-derived products; and Sulfa antibiotics  Home Medications   Current Outpatient Rx  Name  Route  Sig  Dispense  Refill  . acetaminophen (TYLENOL) 650 MG CR tablet   Oral   Take 1,300 mg by mouth 3 (three) times daily.         Marland Kitchen albuterol (PROVENTIL HFA;VENTOLIN HFA) 108 (90 BASE) MCG/ACT inhaler   Inhalation   Inhale 2 puffs into the lungs every 4 (  four) hours as needed for wheezing.   1 Inhaler   0   . albuterol (PROVENTIL) (2.5 MG/3ML) 0.083% nebulizer solution   Nebulization   Take 2.5 mg by nebulization every 6 (six) hours as needed for wheezing.         . benzonatate (TESSALON) 100 MG capsule   Oral   Take 100 mg by mouth daily as needed for cough.         . dextromethorphan-guaiFENesin (MUCINEX DM) 30-600 MG per 12 hr tablet   Oral   Take 1 tablet by mouth daily as needed (for congestion).         . diphenhydrAMINE (BENADRYL) 25 MG tablet   Oral   Take 25 mg by mouth daily.         . fluticasone (FLONASE) 50 MCG/ACT nasal spray   Nasal   Place 2 sprays into the nose daily.   16 g   2   . fluticasone (FLOVENT HFA) 44 MCG/ACT inhaler   Inhalation   Inhale 1 puff into the lungs 2 (two) times daily.         Marland Kitchen gabapentin  (NEURONTIN) 100 MG capsule   Oral   Take 1 capsule (100 mg total) by mouth 3 (three) times daily. NOTE the message in the NOTE section of this eScript. NOTE   0.0001 capsule   0     The 800 number faxed over the other day is for [facility] ...   . HYDROcodone-acetaminophen (NORCO/VICODIN) 5-325 MG per tablet   Oral   Take 1 tablet by mouth 2 (two) times daily as needed for pain.         Marland Kitchen levothyroxine (SYNTHROID, LEVOTHROID) 100 MCG tablet   Oral   Take 100 mcg by mouth every morning.          Marland Kitchen lisinopril (PRINIVIL,ZESTRIL) 10 MG tablet   Oral   Take 10 mg by mouth every morning.         . loratadine (CLARITIN) 10 MG tablet   Oral   Take 1 tablet (10 mg total) by mouth daily.   30 tablet   0   . Multiple Vitamin (MULTIVITAMIN WITH MINERALS) TABS   Oral   Take 1 tablet by mouth daily.         . naproxen (NAPROSYN) 500 MG tablet   Oral   Take 500 mg by mouth 2 (two) times daily with a meal.         . predniSONE (DELTASONE) 10 MG tablet      Take 6 tablets day one, 5 tablets day two, 4 tablets day three, 3 tablets day four, 2 tablets day five, then 1 tablet day six   21 tablet   0    Triage Vitals: BP 135/82  Pulse 98  Temp(Src) 98.8 F (37.1 C) (Oral)  Resp 16  Ht 5\' 5"  (1.651 m)  Wt 313 lb (141.976 kg)  BMI 52.09 kg/m2  SpO2 96%  Physical Exam  Nursing note and vitals reviewed. Constitutional: She is oriented to person, place, and time. She appears well-developed and well-nourished.  HENT:  Head: Normocephalic and atraumatic.  Eyes: EOM are normal. Pupils are equal, round, and reactive to light.  Neck: Normal range of motion. Neck supple.  Cardiovascular: Normal rate, regular rhythm, normal heart sounds and intact distal pulses.   Pulmonary/Chest: Effort normal and breath sounds normal.  Abdominal: Bowel sounds are normal. She exhibits no distension. There is no tenderness.  Musculoskeletal: Normal  range of motion. She exhibits no edema and no  tenderness.  Neurological: She is alert and oriented to person, place, and time. She has normal strength. No cranial nerve deficit or sensory deficit.  Skin: Skin is warm and dry. No rash noted.  Psychiatric: She has a normal mood and affect.    ED Course  Procedures (including critical care time)  DIAGNOSTIC STUDIES: Oxygen Saturation is 96% on room air, normal by my interpretation.    COORDINATION OF CARE: 10:14 AM-Discussed treatment plan which includes CXR with pt at bedside and pt agreed to plan. Advised pt that she will be prescribed cough medication and steroids and pt agreed with prescriptions.   11:44 AM-Informed pt of negative CXR. Discussed discharge plan which includes continuing the neb treatments with pt and pt agreed to plan. Also advised pt to follow up as needed and pt agreed. Addressed symptoms to return for with pt.   Dg Chest 2 View  02/19/2013   *RADIOLOGY REPORT*  Clinical Data: Cough.  CHEST - 2 VIEW  Comparison: 11/29/2012.  Findings: The cardiac silhouette, mediastinal and hilar contours are within normal limits and stable.  The lungs are clear.  No pleural effusion.  The bony thorax is intact.  IMPRESSION: No acute cardiopulmonary findings.   Original Report Authenticated By: Rudie Meyer, M.D.    1. Bronchitis     MDM  Bronchitis without signs of pneumonia and no wheezing. Treat with steroids, continued nebs at home. PCP followup.   I personally performed the services described in this documentation, which was scribed in my presence. The recorded information has been reviewed and is accurate.     Catherine B. Bernette Mayers, MD 02/19/13 1322

## 2013-02-19 NOTE — ED Notes (Signed)
Pt states cough x 1 week which became worse Saturday. Trouble sleeping due to cough.  Increased SOB at times.

## 2013-02-19 NOTE — ED Notes (Signed)
Pt c/o cough x 1 week.  Denies fever.
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
## 2013-02-25 ENCOUNTER — Ambulatory Visit (HOSPITAL_COMMUNITY): Payer: Self-pay | Admitting: Psychiatry

## 2013-03-05 ENCOUNTER — Ambulatory Visit (INDEPENDENT_AMBULATORY_CARE_PROVIDER_SITE_OTHER): Payer: Self-pay | Admitting: Psychology

## 2013-03-05 ENCOUNTER — Encounter (HOSPITAL_COMMUNITY): Payer: Self-pay | Admitting: Psychology

## 2013-03-05 DIAGNOSIS — F329 Major depressive disorder, single episode, unspecified: Secondary | ICD-10-CM

## 2013-03-05 DIAGNOSIS — F4310 Post-traumatic stress disorder, unspecified: Secondary | ICD-10-CM

## 2013-03-05 DIAGNOSIS — F4312 Post-traumatic stress disorder, chronic: Secondary | ICD-10-CM
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
## 2013-03-15 ENCOUNTER — Emergency Department (HOSPITAL_COMMUNITY)
Admission: EM | Admit: 2013-03-15 | Discharge: 2013-03-15 | Disposition: A | Discharge status: Home / Self Care | Payer: Self-pay | Attending: Emergency Medicine | Admitting: Emergency Medicine

## 2013-03-15 ENCOUNTER — Encounter (HOSPITAL_COMMUNITY): Payer: Self-pay | Admitting: *Deleted

## 2013-03-15 ENCOUNTER — Emergency Department (HOSPITAL_COMMUNITY): Payer: Self-pay

## 2013-03-15 DIAGNOSIS — Z8781 Personal history of (healed) traumatic fracture: Secondary | ICD-10-CM | POA: Insufficient documentation

## 2013-03-15 DIAGNOSIS — G478 Other sleep disorders: Secondary | ICD-10-CM | POA: Insufficient documentation

## 2013-03-15 DIAGNOSIS — IMO0001 Reserved for inherently not codable concepts without codable children: Secondary | ICD-10-CM | POA: Insufficient documentation

## 2013-03-15 DIAGNOSIS — R42 Dizziness and giddiness: Secondary | ICD-10-CM | POA: Insufficient documentation

## 2013-03-15 DIAGNOSIS — Z8659 Personal history of other mental and behavioral disorders: Secondary | ICD-10-CM | POA: Insufficient documentation

## 2013-03-15 DIAGNOSIS — M79609 Pain in unspecified limb: Secondary | ICD-10-CM | POA: Insufficient documentation

## 2013-03-15 DIAGNOSIS — Z79899 Other long term (current) drug therapy: Secondary | ICD-10-CM | POA: Insufficient documentation

## 2013-03-15 DIAGNOSIS — R5381 Other malaise: Secondary | ICD-10-CM | POA: Insufficient documentation

## 2013-03-15 DIAGNOSIS — M169 Osteoarthritis of hip, unspecified: Secondary | ICD-10-CM | POA: Insufficient documentation

## 2013-03-15 DIAGNOSIS — R55 Syncope and collapse: Secondary | ICD-10-CM | POA: Insufficient documentation

## 2013-03-15 DIAGNOSIS — R51 Headache: Secondary | ICD-10-CM | POA: Insufficient documentation

## 2013-03-15 DIAGNOSIS — R0602 Shortness of breath: Secondary | ICD-10-CM

## 2013-03-15 DIAGNOSIS — R002 Palpitations: Secondary | ICD-10-CM | POA: Insufficient documentation

## 2013-03-15 DIAGNOSIS — J441 Chronic obstructive pulmonary disease with (acute) exacerbation: Secondary | ICD-10-CM | POA: Insufficient documentation

## 2013-03-15 DIAGNOSIS — R5383 Other fatigue: Secondary | ICD-10-CM | POA: Insufficient documentation

## 2013-03-15 DIAGNOSIS — Z8739 Personal history of other diseases of the musculoskeletal system and connective tissue: Secondary | ICD-10-CM | POA: Insufficient documentation

## 2013-03-15 DIAGNOSIS — H538 Other visual disturbances: Secondary | ICD-10-CM | POA: Insufficient documentation

## 2013-03-15 DIAGNOSIS — Z91040 Latex allergy status: Secondary | ICD-10-CM | POA: Insufficient documentation

## 2013-03-15 DIAGNOSIS — M1610 Unilateral primary osteoarthritis, unspecified hip: Secondary | ICD-10-CM | POA: Insufficient documentation

## 2013-03-15 DIAGNOSIS — IMO0002 Reserved for concepts with insufficient information to code with codable children: Secondary | ICD-10-CM | POA: Insufficient documentation

## 2013-03-15 DIAGNOSIS — E079 Disorder of thyroid, unspecified: Secondary | ICD-10-CM | POA: Insufficient documentation

## 2013-03-15 DIAGNOSIS — R350 Frequency of micturition: Secondary | ICD-10-CM | POA: Insufficient documentation

## 2013-03-15 DIAGNOSIS — R110 Nausea: Secondary | ICD-10-CM | POA: Insufficient documentation

## 2013-03-15 DIAGNOSIS — G4700 Insomnia, unspecified: Secondary | ICD-10-CM | POA: Insufficient documentation

## 2013-03-15 DIAGNOSIS — R413 Other amnesia: Secondary | ICD-10-CM | POA: Insufficient documentation

## 2013-03-15 LAB — COMPREHENSIVE METABOLIC PANEL
ALT: 10 U/L (ref 0–35)
AST: 18 U/L (ref 0–37)
Albumin: 3.8 g/dL (ref 3.5–5.2)
Calcium: 9.6 mg/dL (ref 8.4–10.5)
Sodium: 137 mEq/L (ref 135–145)
Total Protein: 7.1 g/dL (ref 6.0–8.3)

## 2013-03-15 LAB — CBC WITH DIFFERENTIAL/PLATELET
Basophils Relative: 1 % (ref 0–1)
Eosinophils Absolute: 0.4 10*3/uL (ref 0.0–0.7)
MCH: 28.9 pg (ref 26.0–34.0)
MCHC: 33.6 g/dL (ref 30.0–36.0)
Neutrophils Relative %: 65 % (ref 43–77)
Platelets: 233 10*3/uL (ref 150–400)
RBC: 4.77 MIL/uL (ref 3.87–5.11)

## 2013-03-15 LAB — TROPONIN I: Troponin I: <0.3 ng/mL (ref <0.30)

## 2013-03-15 LAB — PRO B NATRIURETIC PEPTIDE: Pro B Natriuretic peptide (BNP): 32.7 pg/mL (ref 0–125)

## 2013-03-15 NOTE — ED Notes (Signed)
Pt states that she has been having problems of being sob, feeling her heart racing when she lays down for the past 6 months, states that when she went to lay down last night she felt sob and felt like her heart was racing and that she was going to pass out and still feels "funny", sob, today.   pt also c/o pain to right elbow and right hand pain, was seeing Dr. Hilda Lias for her arm but is no longer seeing him any longer. Is scheduled to see additional ortho in [place] Tuesday.

## 2013-03-15 NOTE — ED Provider Notes (Addendum)
CSN: [id number]     Arrival date & time 03/15/13  1345 History    This chart was scribed for Shelda Jakes, MD,  by Ashley Jacobs, ED Scribe. The patient was seen in room APA04/APA04 and the patient's care was started at 3:25 PM.     Chief Complaint  Patient presents with  . Shortness of Breath   The history is provided by the patient and medical records. No language interpreter was used.   HPI Comments: Catherine Turner is a 57 y.o. female who presents to the Emergency Department complaining of SOB after laying down and reaching near syncope the night PTA after laying flat for the past 4-5 months. Around 8 am the morning of arrival she reports waking up but losing memory about what happened a time period afterwards. She reports being confused during the time period and feeling "asleep". She was dx with sleep apnea. However she mentions the symptoms are gradually worsening and never feeling this bad. She is accustomed to having symptoms flare up due to her COPD in cycles of 4-6 weeks but she report the symptom that she is experiencing now is unusual and scary. She mentions that she is undergoing a worker's compensation case and feels stressed. She also reports feeling nauseous due to arm pain and reports that the recent pain is different. A few weeks ago she mentions having visual disturbances and seeing splotches of "black snowmen". Pt reports that she on a special diet and being on steroids. She had her thyroids checked at clinic and has a hx of thyroid disease, asthma, COPD, DJD, sleep apnea, thrush and PTSD. Pt visits a free clinic.  She mentions that her father had hypertension and her mother had COPD.  Past Medical History  Diagnosis Date  . Asthma   . COPD (chronic obstructive pulmonary disease)   . Arm fracture     right arm   . DJD (degenerative joint disease) of hip   . Thyroid disease   . Hip pain   . PTSD (post-traumatic stress disorder)   . Insomnia    History reviewed.  No pertinent past surgical history. Family History  Problem Relation Age of Onset  . COPD Mother   . Cancer - Lung Mother   . Hypertension Father   . Drug abuse Sister   . Dementia Maternal Aunt   . Heart disease Maternal Grandfather   . Heart disease Maternal Grandmother   . Heart disease Paternal Grandfather   . Heart attack Paternal Grandfather   . Heart disease Paternal Grandmother   . ADD / ADHD Neg Hx   . Bipolar disorder Neg Hx   . OCD Neg Hx   . Paranoid behavior Neg Hx   . Schizophrenia Neg Hx   . Seizures Neg Hx   . Sexual abuse Neg Hx   . Physical abuse Other   . Anxiety disorder Other   . Birth defects Other     Carlynn Spry  . Alcohol abuse Paternal Aunt   . Alcohol abuse Paternal Uncle    History  Substance Use Topics  . Smoking status: Passive Smoke Exposure - Never Smoker  . Smokeless tobacco: Never Used  . Alcohol Use: No   OB History   Grav Para Term Preterm Abortions TAB SAB Ect Mult Living                 Review of Systems  Constitutional: Positive for fatigue. Negative for chills.  HENT: Negative for congestion.  Eyes: Positive for visual disturbance.  Respiratory: Positive for shortness of breath.   Cardiovascular: Positive for palpitations.  Gastrointestinal: Positive for nausea. Negative for abdominal pain.  Genitourinary: Positive for frequency. Negative for dysuria.  Musculoskeletal: Positive for myalgias.  Skin: Negative for rash.  Neurological: Positive for dizziness and headaches.       Near syncope   Hematological: Does not bruise/bleed easily.  Psychiatric/Behavioral: Negative for confusion.  All other systems reviewed and are negative.    Allergies  Advair diskus; Jasmine fragrance; Diclofenac; Eggs or egg-derived products; Lactase; Molds & smuts; Pravastatin; Latex; Pork-derived products; and Sulfa antibiotics  Home Medications   Current Outpatient Rx  Name  Route  Sig  Dispense  Refill  . albuterol (PROVENTIL) (2.5  MG/3ML) 0.083% nebulizer solution   Nebulization   Take 2.5 mg by nebulization every 6 (six) hours as needed for wheezing.         . benzonatate (TESSALON) 100 MG capsule   Oral   Take 100 mg by mouth every 6 (six) hours as needed for cough.          . desloratadine (CLARINEX) 5 MG tablet   Oral   Take 5 mg by mouth daily as needed (allergies).         . diphenhydrAMINE (BENADRYL) 50 MG capsule   Oral   Take 50 mg by mouth at bedtime as needed for sleep.          . fluticasone (FLOVENT HFA) 44 MCG/ACT inhaler   Inhalation   Inhale 2 puffs into the lungs 2 (two) times daily.          Marland Kitchen HYDROcodone-acetaminophen (NORCO/VICODIN) 5-325 MG per tablet   Oral   Take 1 tablet by mouth 2 (two) times daily as needed for pain.         Marland Kitchen levothyroxine (SYNTHROID, LEVOTHROID) 100 MCG tablet   Oral   Take 100 mcg by mouth every morning.          Marland Kitchen lisinopril (PRINIVIL,ZESTRIL) 10 MG tablet   Oral   Take 10 mg by mouth every morning.         . Multiple Vitamin (MULTIVITAMIN WITH MINERALS) TABS   Oral   Take 1 tablet by mouth daily.         Marland Kitchen acetaminophen (TYLENOL) 650 MG CR tablet   Oral   Take 1,300 mg by mouth 3 (three) times daily as needed for pain.          Marland Kitchen albuterol (PROVENTIL HFA;VENTOLIN HFA) 108 (90 BASE) MCG/ACT inhaler   Inhalation   Inhale 2 puffs into the lungs every 4 (four) hours as needed for wheezing.   1 Inhaler   0   . dextromethorphan-guaiFENesin (MUCINEX DM) 30-600 MG per 12 hr tablet   Oral   Take 1 tablet by mouth daily as needed (for congestion).         . diphenhydrAMINE (BENADRYL) 25 MG tablet   Oral   Take 25 mg by mouth every 8 (eight) hours as needed for allergies.          . naproxen (NAPROSYN) 500 MG tablet   Oral   Take 500 mg by mouth 2 (two) times daily as needed (pain).          . pseudoephedrine-dextromethorphan-guaifenesin (ROBITUSSIN-PE) 30-10-100 MG/5ML solution   Oral   Take 10 mLs by mouth 4 (four)  times daily as needed for cough.          BP  127/72  Pulse 77  Temp(Src) 98.3 F (36.8 C) (Oral)  Resp 20  Ht 5\' 5"  (1.651 m)  Wt 316 lb (143.337 kg)  BMI 52.59 kg/m2  SpO2 100% Physical Exam  Nursing note and vitals reviewed. Constitutional: She is oriented to person, place, and time. She appears well-developed and well-nourished. No distress.  Eyes: Conjunctivae and EOM are normal. Pupils are equal, round, and reactive to light. Right eye exhibits no discharge. Left eye exhibits no discharge.  Neck: Normal range of motion. Neck supple.  Cardiovascular: Normal rate, regular rhythm and normal heart sounds.   No murmur heard. Pulmonary/Chest: Effort normal and breath sounds normal. She has no wheezes. She has no rales.  Abdominal: Bowel sounds are normal. There is no tenderness. There is no rebound and no guarding.  Musculoskeletal: Normal range of motion.  Neurological: She is alert and oriented to person, place, and time. No cranial nerve deficit. She exhibits normal muscle tone. Coordination normal.  Skin: Skin is warm. She is not diaphoretic.  Psychiatric: She has a normal mood and affect. Her behavior is normal.    ED Course  DIAGNOSTIC STUDIES: Oxygen Saturation is 100% on room air, normal by my interpretation.    COORDINATION OF CARE: 3:37 PM Discussed course of care with pt which includes . Pt understands and agrees.  Procedures (including critical care time)  Labs Reviewed  CBC WITH DIFFERENTIAL - Abnormal; Notable for the following:    WBC 12.2 (*)    Neutro Abs 8.0 (*)    All other components within normal limits  COMPREHENSIVE METABOLIC PANEL - Abnormal; Notable for the following:    Glucose, Bld 121 (*)    GFR calc non Af Amer 61 (*)    GFR calc Af Amer 70 (*)    All other components within normal limits  TROPONIN I  PRO B NATRIURETIC PEPTIDE   Dg Chest 2 View  03/15/2013   *RADIOLOGY REPORT*  Clinical Data: Short of breath  CHEST - 2 VIEW  Comparison:   02/19/2013  Findings:  The heart size and mediastinal contours are within normal limits.  Both lungs are clear.  The visualized skeletal structures are unremarkable.  IMPRESSION: No active cardiopulmonary disease.   Original Report Authenticated By: Janeece Riggers, M.D.   Ct Head Wo Contrast  03/15/2013   *RADIOLOGY REPORT*  Clinical Data: Short of breath.  Near-syncope and memory loss  CT HEAD WITHOUT CONTRAST  Technique:  Contiguous axial images were obtained from the base of the skull through the vertex without contrast.  Comparison: None  Findings: Ventricle size is normal.  Negative for acute or chronic infarction.  Negative for hemorrhage or mass.  No skull lesion identified.  IMPRESSION: Negative   Original Report Authenticated By: Janeece Riggers, M.D.    Results for orders placed during the hospital encounter of 03/15/13  TROPONIN I      Result Value Range   Troponin I <0.30  <0.30 ng/mL  CBC WITH DIFFERENTIAL      Result Value Range   WBC 12.2 (*) 4.0 - 10.5 K/uL   RBC 4.77  3.87 - 5.11 MIL/uL   Hemoglobin 13.8  12.0 - 15.0 g/dL   HCT 54.0  98.1 - 19.1 %   MCV 86.2  78.0 - 100.0 fL   MCH 28.9  26.0 - 34.0 pg   MCHC 33.6  30.0 - 36.0 g/dL   RDW 47.8  29.5 - 62.1 %   Platelets 233  150 - 400  K/uL   Neutrophils Relative % 65  43 - 77 %   Neutro Abs 8.0 (*) 1.7 - 7.7 K/uL   Lymphocytes Relative 26  12 - 46 %   Lymphs Abs 3.1  0.7 - 4.0 K/uL   Monocytes Relative 6  3 - 12 %   Monocytes Absolute 0.7  0.1 - 1.0 K/uL   Eosinophils Relative 3  0 - 5 %   Eosinophils Absolute 0.4  0.0 - 0.7 K/uL   Basophils Relative 1  0 - 1 %   Basophils Absolute 0.1  0.0 - 0.1 K/uL  PRO B NATRIURETIC PEPTIDE      Result Value Range   Pro B Natriuretic peptide (BNP) 32.7  0 - 125 pg/mL  COMPREHENSIVE METABOLIC PANEL      Result Value Range   Sodium 137  135 - 145 mEq/L   Potassium 4.1  3.5 - 5.1 mEq/L   Chloride 100  96 - 112 mEq/L   CO2 28  19 - 32 mEq/L   Glucose, Bld 121 (*) 70 - 99 mg/dL   BUN 9   6 - 23 mg/dL   Creatinine, Ser 9.60  0.50 - 1.10 mg/dL   Calcium 9.6  8.4 - 45.4 mg/dL   Total Protein 7.1  6.0 - 8.3 g/dL   Albumin 3.8  3.5 - 5.2 g/dL   AST 18  0 - 37 U/L   ALT 10  0 - 35 U/L   Alkaline Phosphatase 76  39 - 117 U/L   Total Bilirubin 0.8  0.3 - 1.2 mg/dL   GFR calc non Af Amer 61 (*) >90 mL/min   GFR calc Af Amer 70 (*) >90 mL/min    Date: 03/15/2013  Rate: 80  Rhythm: normal sinus rhythm  QRS Axis: normal  Intervals: normal  ST/T Wave abnormalities: normal  Conduction Disutrbances:none  Narrative Interpretation:   Old EKG Reviewed: none available     1. Heart palpitations   2. Shortness of breath     MDM  Patient's workup in the emergency apartment without any significant findings. EKG without any acute changes troponin negative so not likely a silent MI. Patient's chest x-ray negative for pneumonia or pneumothorax. Patient's symptoms sound as if they could be related to some sleep apnea. Also in addition patient could be having a tachycardia arrhythmia no evidence of that here was monitored throughout the ED stay. Patient also laid flat no drop in room air pulse ox and also no development of any arrhythmia. Patient will be discharged for followup with sleep apnea clinic patient will also be referred to cardiology. Patient given per cautions that if rapid palpitations recur or do not resolve in 30 minutes to call EMS.  Patient's pulse ox in the emergency department is always been consistently above 95% on room air.  I personally performed the services described in this documentation, which was scribed in my presence. The recorded information has been reviewed and is accurate.      Shelda Jakes, MD 03/15/13 1850  Shelda Jakes, MD 03/15/13 [phone]

## 2013-03-19 ENCOUNTER — Encounter (HOSPITAL_COMMUNITY): Payer: Self-pay | Admitting: Psychology

## 2013-03-19 ENCOUNTER — Ambulatory Visit (INDEPENDENT_AMBULATORY_CARE_PROVIDER_SITE_OTHER): Payer: Self-pay | Admitting: Psychology

## 2013-03-19 DIAGNOSIS — F4312 Post-traumatic stress disorder, chronic: Secondary | ICD-10-CM

## 2013-03-19 DIAGNOSIS — F329 Major depressive disorder, single episode, unspecified: Secondary | ICD-10-CM

## 2013-03-19 DIAGNOSIS — F3289 Other specified depressive episodes: Secondary | ICD-10-CM

## 2013-03-19 DIAGNOSIS — F4310 Post-traumatic stress disorder, unspecified: Secondary | ICD-10-CM
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
## 2013-04-02 ENCOUNTER — Ambulatory Visit (INDEPENDENT_AMBULATORY_CARE_PROVIDER_SITE_OTHER): Payer: Self-pay | Admitting: Psychology

## 2013-04-02 DIAGNOSIS — F4312 Post-traumatic stress disorder, chronic: Secondary | ICD-10-CM

## 2013-04-02 DIAGNOSIS — F3289 Other specified depressive episodes: Secondary | ICD-10-CM

## 2013-04-02 DIAGNOSIS — F329 Major depressive disorder, single episode, unspecified: Secondary | ICD-10-CM

## 2013-04-02 DIAGNOSIS — F4310 Post-traumatic stress disorder, unspecified: Secondary | ICD-10-CM

## 2013-04-14 ENCOUNTER — Other Ambulatory Visit (HOSPITAL_COMMUNITY): Payer: Self-pay

## 2013-04-14 DIAGNOSIS — G4730 Sleep apnea, unspecified: Secondary | ICD-10-CM

## 2013-04-20 ENCOUNTER — Ambulatory Visit: Payer: Medicaid Other | Attending: Physician Assistant | Admitting: Sleep Medicine

## 2013-04-20 DIAGNOSIS — G4733 Obstructive sleep apnea (adult) (pediatric): Secondary | ICD-10-CM | POA: Insufficient documentation

## 2013-04-20 DIAGNOSIS — G4730 Sleep apnea, unspecified: Secondary | ICD-10-CM

## 2013-04-20 DIAGNOSIS — Z6841 Body Mass Index (BMI) 40.0 and over, adult: Secondary | ICD-10-CM | POA: Insufficient documentation

## 2013-04-23 ENCOUNTER — Ambulatory Visit (HOSPITAL_COMMUNITY): Payer: Self-pay | Admitting: Psychology

## 2013-04-26 NOTE — Procedures (Signed)
[facility] Corrion Stirewalt A. Gerilyn Pilgrim, MD     [email or web address]        NAMEABYGALE, KARPF                ACCOUNT NO.:  [id number]  MEDICAL RECORD NO.:  [id number]          PATIENT TYPE:  OUT  LOCATION:  SLEEP LAB                     FACILITY:  [facility]  PHYSICIAN:  Nikoletta Varma A. Gerilyn Pilgrim, M.D. DATE OF BIRTH:  1956/06/13  DATE OF STUDY:  04/20/2013                           NOCTURNAL POLYSOMNOGRAM  REFERRING PHYSICIAN:  SHANNON G MCELROY  INDICATION FOR STUDY:  This is a 57 year old who presents with snoring and hypersomnia.  The study is being done to evaluate for obstructive sleep apnea syndrome.  EPWORTH SLEEPINESS SCORE:  6.  BMI 53.  MEDICATIONS:  Tussionex, levothyroxine, lisinopril, naproxen p.r.n., Tylenol p.r.n., and hydrocodone p.r.n.  SLEEP ARCHITECTURE:  The total recording time is 411 minutes.  Sleep efficiency is 64%.  Sleep latency 5 minutes.  REM latency 202 minutes. Stage N1 17%, N2 47%, N3 27%, and REM sleep 9%.  RESPIRATORY DATA:  Baseline oxygen saturation is 98, lowest saturation 79 during non-REM sleep.  Diagnostic AHI 10 and RDI 11.  More events occurred during REM sleep.  The REM AHI is 60.   CARDIAC DATA:  Average heart rate is 83 with no significant dysrhythmias observed.  MOVEMENT-PARASOMNIA:  PLM index 0.  IMPRESSIONS-RECOMMENDATIONS:  Mild-to-moderate obstructive sleep apnea syndrome worse during REM sleep.  Formal CPAP titration study.  Thanks for this referral.    Fredrica Capano A. Gerilyn Pilgrim, M.D.   KAD/MEDQ  D:  04/26/2013 19:28:00  T:  04/26/2013 21:30:86  Job:  [id number]

## 2013-04-30 ENCOUNTER — Ambulatory Visit (HOSPITAL_COMMUNITY): Payer: Self-pay | Admitting: Psychology

## 2013-05-12 ENCOUNTER — Ambulatory Visit (INDEPENDENT_AMBULATORY_CARE_PROVIDER_SITE_OTHER): Payer: Self-pay | Admitting: Psychology

## 2013-05-12 DIAGNOSIS — F4310 Post-traumatic stress disorder, unspecified: Secondary | ICD-10-CM

## 2013-05-12 DIAGNOSIS — F3289 Other specified depressive episodes: Secondary | ICD-10-CM

## 2013-05-12 DIAGNOSIS — F4312 Post-traumatic stress disorder, chronic: Secondary | ICD-10-CM

## 2013-05-12 DIAGNOSIS — F329 Major depressive disorder, single episode, unspecified: Secondary | ICD-10-CM

## 2013-05-22 ENCOUNTER — Encounter (HOSPITAL_COMMUNITY): Payer: Self-pay | Admitting: Psychology
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
## 2013-06-05 ENCOUNTER — Other Ambulatory Visit (HOSPITAL_COMMUNITY): Payer: Self-pay

## 2013-06-05 DIAGNOSIS — G4730 Sleep apnea, unspecified: Secondary | ICD-10-CM

## 2013-06-10 ENCOUNTER — Ambulatory Visit: Payer: Medicaid Other | Attending: Physician Assistant | Admitting: Sleep Medicine

## 2013-06-10 VITALS — Ht 65.0 in | Wt 328.0 lb

## 2013-06-10 DIAGNOSIS — G4730 Sleep apnea, unspecified: Secondary | ICD-10-CM

## 2013-06-10 DIAGNOSIS — G4733 Obstructive sleep apnea (adult) (pediatric): Secondary | ICD-10-CM | POA: Insufficient documentation

## 2013-06-11 NOTE — Procedures (Signed)
[facility] Cleotis Sparr A. Gerilyn Pilgrim, MD     [email or web address]        Catherine Turner, Catherine Turner                ACCOUNT NO.:  [id number]  MEDICAL RECORD NO.:  [id number]          PATIENT TYPE:  OUT  LOCATION:  SLEEP LAB                     FACILITY:  [facility]  PHYSICIAN:  Analyssa Downs A. Gerilyn Pilgrim, M.D. DATE OF BIRTH:  1955/08/29  DATE OF STUDY:  06/10/2013                           NOCTURNAL POLYSOMNOGRAM  REFERRING PHYSICIAN:  SHANNON G MCELROY  INDICATION FOR STUDY:  A 57 year old female who had a previous study documenting moderate obstructive sleep apnea syndrome.  This is a CPAP titration recording.  MEDICATIONS:  Gabapentin, Flovent, albuterol, Clarinex, Flonase, cetirizine, Benadryl, Tussionex Perles, Mucinex, levothyroxine, lisinopril, naproxen, Tylenol, hydrocodone, and acetaminophen.  EPWORTH SLEEPINESS SCORE: 1. BMI 55.  ARCHITECTURAL SUMMARY:  The total recording time is 425 minutes.  Sleep efficiency is 52%.  Sleep latency 18 minutes.  REM latency 234 minutes. Stage N1 17%, N2 66%, N3 0%, and REM sleep 16%.  RESPIRATORY SUMMARY:  Baseline oxygen saturation is 97, lowest saturation 89 during REM and non-REM sleep.  The patient was started on positive pressure at 6 and increased to 11, optimal pressure is 11 with resolution of obstructive events and good tolerance.  LIMB MOVEMENT SUMMARY:  PLM index is 0.  ELECTROCARDIOGRAM SUMMARY:  Average heart rate is 73, with no significant dysrhythmias observed.  IMPRESSION:  Obstructive sleep apnea syndrome, which responded well to a CPAP of 11.        Reann Dobias A. Gerilyn Pilgrim, M.D.    KAD/MEDQ  D:  06/11/2013 09:43:45  T:  06/11/2013 10:27:42  Job:  [id number]

## 2013-06-12 ENCOUNTER — Ambulatory Visit (INDEPENDENT_AMBULATORY_CARE_PROVIDER_SITE_OTHER): Payer: Self-pay | Admitting: Psychology

## 2013-06-12 ENCOUNTER — Telehealth (HOSPITAL_COMMUNITY): Payer: Self-pay | Admitting: *Deleted

## 2013-06-12 ENCOUNTER — Encounter (HOSPITAL_COMMUNITY): Payer: Self-pay | Admitting: Psychology

## 2013-06-12 DIAGNOSIS — F3289 Other specified depressive episodes: Secondary | ICD-10-CM

## 2013-06-12 DIAGNOSIS — F329 Major depressive disorder, single episode, unspecified: Secondary | ICD-10-CM

## 2013-06-12 DIAGNOSIS — F4312 Post-traumatic stress disorder, chronic: Secondary | ICD-10-CM

## 2013-06-12 DIAGNOSIS — F4310 Post-traumatic stress disorder, unspecified: Secondary | ICD-10-CM
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
## 2013-06-27 ENCOUNTER — Encounter (HOSPITAL_COMMUNITY): Payer: Self-pay | Admitting: Psychology
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
## 2013-06-27 NOTE — Progress Notes (Deleted)
Psychiatric Assessment Adult  Patient Identification:  AERIS HERSMAN Date of Evaluation:  06/27/2013 Chief Complaint: *** History of Chief Complaint:   Chief Complaint  Patient presents with  . Anxiety  . Depression  . Stress    HPI Review of Systems Physical Exam  Depressive Symptoms: {DEPRESSION SYMPTOMS:20000}  (Hypo) Manic Symptoms:   Elevated Mood:  {BHH YES OR NO:22294} Irritable Mood:  {BHH YES OR NO:22294} Grandiosity:  {BHH YES OR NO:22294} Distractibility:  {BHH YES OR NO:22294} Labiality of Mood:  {BHH YES OR NO:22294} Delusions:  {BHH YES OR NO:22294} Hallucinations:  {BHH YES OR NO:22294} Impulsivity:  {BHH YES OR NO:22294} Sexually Inappropriate Behavior:  {BHH YES OR NO:22294} Financial Extravagance:  {BHH YES OR NO:22294} Flight of Ideas:  {BHH YES OR NO:22294}  Anxiety Symptoms: Excessive Worry:  {BHH YES OR NO:22294} Panic Symptoms:  {BHH YES OR NO:22294} Agoraphobia:  {BHH YES OR NO:22294} Obsessive Compulsive: {BHH YES OR NO:22294}  Symptoms: {Obsessive Compulsive Symptoms:22671} Specific Phobias:  {BHH YES OR NO:22294} Social Anxiety:  {BHH YES OR NO:22294}  Psychotic Symptoms:  Hallucinations: {BHH YES OR NO:22294} {Hallucinations:22672} Delusions:  {BHH YES OR NO:22294} Paranoia:  {BHH YES OR NO:22294}   Ideas of Reference:  {BHH YES OR NO:22294}  PTSD Symptoms: Ever had a traumatic exposure:  {BHH YES OR NO:22294} Had a traumatic exposure in the last month:  {BHH YES OR NO:22294} Re-experiencing: {BHH YES OR NO:22294} {Re-experiencing:22673} Hypervigilance:  {BHH YES OR NO:22294} Hyperarousal: {BHH YES OR NO:22294} {Hyperarousal:22674} Avoidance: {BHH YES OR NO:22294} {Avoidance:22675}  Traumatic Brain Injury: {BHH YES OR NO:22294} {Traumatic Brain Injury:22676}  Past Psychiatric History: Diagnosis: ***  Hospitalizations: ***  Outpatient Care: ***  Substance Abuse Care: ***  Self-Mutilation: ***  Suicidal Attempts: ***  Violent  Behaviors: ***   Past Medical History:   Past Medical History  Diagnosis Date  . Asthma   . COPD (chronic obstructive pulmonary disease)   . Arm fracture     right arm   . DJD (degenerative joint disease) of hip   . Thyroid disease   . Hip pain   . PTSD (post-traumatic stress disorder)   . Insomnia    History of Loss of Consciousness:  {BHH YES OR NO:22294} Seizure History:  {BHH YES OR NO:22294} Cardiac History:  {BHH YES OR NO:22294} Allergies:   Allergies  Allergen Reactions  . Advair Diskus [Fluticasone-Salmeterol] Other (See Comments)    Blood in urine  . Jasmine Fragrance Anaphylaxis  . Diclofenac   . Eggs Or Egg-Derived Products   . Lactase   . Molds & Smuts   . Pravastatin   . Latex Rash  . Pork-Derived Products Rash  . Sulfa Antibiotics Rash   Current Medications:  Current Outpatient Prescriptions  Medication Sig Dispense Refill  . acetaminophen (TYLENOL) 650 MG CR tablet Take 1,300 mg by mouth 3 (three) times daily as needed for pain.       Marland Kitchen albuterol (PROVENTIL HFA;VENTOLIN HFA) 108 (90 BASE) MCG/ACT inhaler Inhale 2 puffs into the lungs every 4 (four) hours as needed for wheezing.  1 Inhaler  0  . albuterol (PROVENTIL) (2.5 MG/3ML) 0.083% nebulizer solution Take 2.5 mg by nebulization every 6 (six) hours as needed for wheezing.      . benzonatate (TESSALON) 100 MG capsule Take 100 mg by mouth every 6 (six) hours as needed for cough.       . desloratadine (CLARINEX) 5 MG tablet Take 5 mg by mouth daily as needed (allergies).      Marland Kitchen  dextromethorphan-guaiFENesin (MUCINEX DM) 30-600 MG per 12 hr tablet Take 1 tablet by mouth daily as needed (for congestion).      . diphenhydrAMINE (BENADRYL) 25 MG tablet Take 25 mg by mouth every 8 (eight) hours as needed for allergies.       . diphenhydrAMINE (BENADRYL) 50 MG capsule Take 50 mg by mouth at bedtime as needed for sleep.       . fluticasone (FLOVENT HFA) 44 MCG/ACT inhaler Inhale 2 puffs into the lungs 2 (two) times  daily.       Marland Kitchen HYDROcodone-acetaminophen (NORCO/VICODIN) 5-325 MG per tablet Take 1 tablet by mouth 2 (two) times daily as needed for pain.      Marland Kitchen levothyroxine (SYNTHROID, LEVOTHROID) 100 MCG tablet Take 100 mcg by mouth every morning.       Marland Kitchen lisinopril (PRINIVIL,ZESTRIL) 10 MG tablet Take 10 mg by mouth every morning.      . Multiple Vitamin (MULTIVITAMIN WITH MINERALS) TABS Take 1 tablet by mouth daily.      . naproxen (NAPROSYN) 500 MG tablet Take 500 mg by mouth 2 (two) times daily as needed (pain).       . pseudoephedrine-dextromethorphan-guaifenesin (ROBITUSSIN-PE) 30-10-100 MG/5ML solution Take 10 mLs by mouth 4 (four) times daily as needed for cough.       No current facility-administered medications for this visit.    Previous Psychotropic Medications:  Medication Dose   ***  ***                     Substance Abuse History in the last 12 months: Substance Age of 1st Use Last Use Amount Specific Type  Nicotine  ***  ***  ***  ***  Alcohol  ***  ***  ***  ***  Cannabis  ***  ***  ***  ***  Opiates  ***  ***  ***  ***  Cocaine  ***  ***  ***  ***  Methamphetamines  ***  ***  ***  ***  LSD  ***  ***  ***  ***  Ecstasy  ***   ***  ***  ***  Benzodiazepines  ***  ***  ***  ***  Caffeine  ***  ***  ***  ***  Inhalants  ***  ***  ***  ***  Others:                          Medical Consequences of Substance Abuse: ***  Legal Consequences of Substance Abuse: ***  Family Consequences of Substance Abuse: ***  Blackouts:  {BHH YES OR NO:22294} DT's:  {BHH YES OR NO:22294} Withdrawal Symptoms:  {BHH YES OR NO:22294} {Withdrawal Symptoms:22677}  Social History: Current Place of Residence: *** Place of Birth: *** Family Members: *** Marital Status:  {Marital Status:22678} Children: ***  Sons: ***  Daughters: *** Relationships: *** Education:  {Education:22679} Educational Problems/Performance: *** Religious Beliefs/Practices: *** History of Abuse: {Desc;  abuse:16542} Occupational Experiences; Military History:  {Military History:22680} Legal History: *** Hobbies/Interests: ***  Family History:   Family History  Problem Relation Age of Onset  . COPD Mother   . Cancer - Lung Mother   . Hypertension Father   . Drug abuse Sister   . Dementia Maternal Aunt   . Heart disease Maternal Grandfather   . Heart disease Maternal Grandmother   . Heart disease Paternal Grandfather   . Heart attack Paternal Grandfather   . Heart disease Paternal Grandmother   .  ADD / ADHD Neg Hx   . Bipolar disorder Neg Hx   . OCD Neg Hx   . Paranoid behavior Neg Hx   . Schizophrenia Neg Hx   . Seizures Neg Hx   . Sexual abuse Neg Hx   . Physical abuse Other   . Anxiety disorder Other   . Birth defects Other     Carlynn Spry  . Alcohol abuse Paternal Aunt   . Alcohol abuse Paternal Uncle     Mental Status Examination/Evaluation: Objective:  Appearance: {Appearance:22683}  Eye Contact::  {BHH EYE CONTACT:22684}  Speech:  {Speech:22685}  Volume:  {Volume (PAA):22686}  Mood:  ***  Affect:  {Affect (PAA):22687}  Thought Process:  {Thought Process (PAA):22688}  Orientation:  {BHH ORIENTATION (PAA):22689}  Thought Content:  {Thought Content:22690}  Suicidal Thoughts:  {ST/HT (PAA):22692}  Homicidal Thoughts:  {ST/HT (PAA):22692}  Judgement:  {Judgement (PAA):22694}  Insight:  {Insight (PAA):22695}  Psychomotor Activity:  {Psychomotor (PAA):22696}  Akathisia:  {BHH YES OR NO:22294}  Handed:  {Handed:22697}  AIMS (if indicated):  ***  Assets:  {Assets (PAA):22698}    Laboratory/X-Ray Psychological Evaluation(s)   ***  ***   Assessment:  {axis diagnosis:3049000}  AXIS I {psych axis 1:31909}  AXIS II {psych axis 2:31910}  AXIS III Past Medical History  Diagnosis Date  . Asthma   . COPD (chronic obstructive pulmonary disease)   . Arm fracture     right arm   . DJD (degenerative joint disease) of hip   . Thyroid disease   . Hip pain   .  PTSD (post-traumatic stress disorder)   . Insomnia      AXIS IV {psych axis iv:31915}  AXIS V {psych axis v score:31919}   Treatment Plan/Recommendations:  Plan of Care: ***  Laboratory:  {Laboratory:22682}  Psychotherapy: ***  Medications: ***  Routine PRN Medications:  {BHH YES OR NO:22294}  Consultations: ***  Safety Concerns:  ***  Other:      RODENBOUGH,JOHN R, PsyD 11/21/201411:11 AM

## 2013-07-07 ENCOUNTER — Ambulatory Visit (INDEPENDENT_AMBULATORY_CARE_PROVIDER_SITE_OTHER): Payer: Self-pay | Admitting: Psychology

## 2013-07-07 ENCOUNTER — Encounter (HOSPITAL_COMMUNITY): Payer: Self-pay | Admitting: Psychology

## 2013-07-07 DIAGNOSIS — F4312 Post-traumatic stress disorder, chronic: Secondary | ICD-10-CM

## 2013-07-07 DIAGNOSIS — F411 Generalized anxiety disorder: Secondary | ICD-10-CM

## 2013-07-07 DIAGNOSIS — F329 Major depressive disorder, single episode, unspecified: Secondary | ICD-10-CM

## 2013-07-07 DIAGNOSIS — F4310 Post-traumatic stress disorder, unspecified: Secondary | ICD-10-CM
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
## 2013-07-30 ENCOUNTER — Ambulatory Visit (INDEPENDENT_AMBULATORY_CARE_PROVIDER_SITE_OTHER): Payer: Self-pay | Admitting: Internal Medicine

## 2013-07-30 ENCOUNTER — Encounter: Payer: Self-pay | Admitting: Internal Medicine

## 2013-07-30 VITALS — BP 134/82 | HR 81 | Ht 65.0 in | Wt 324.0 lb

## 2013-07-30 DIAGNOSIS — J302 Other seasonal allergic rhinitis: Secondary | ICD-10-CM

## 2013-07-30 DIAGNOSIS — J309 Allergic rhinitis, unspecified: Secondary | ICD-10-CM

## 2013-07-30 DIAGNOSIS — F418 Other specified anxiety disorders: Secondary | ICD-10-CM

## 2013-07-30 DIAGNOSIS — F341 Dysthymic disorder: Secondary | ICD-10-CM

## 2013-07-30 DIAGNOSIS — G4733 Obstructive sleep apnea (adult) (pediatric): Secondary | ICD-10-CM | POA: Insufficient documentation

## 2013-07-30 DIAGNOSIS — F489 Nonpsychotic mental disorder, unspecified: Secondary | ICD-10-CM

## 2013-07-30 NOTE — Assessment & Plan Note (Signed)
Patient reports history and asks future attention for this.

## 2013-07-30 NOTE — Assessment & Plan Note (Signed)
NPSG 04/20/13- AHI 10/ hr. CPAP titration 06/10/13> 11 cwp/ hr w/ F/P Simplus mask. Epworth6/24, BMI 53. We discussed her mild sleep apnea, complicated by obesity, HBP Plan- CPAP- start at 11.
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
## 2013-07-30 NOTE — Patient Instructions (Signed)
Order- new DME CPAP 11 cwp, mask of choice, supplies     Dx OSA

## 2013-08-11 ENCOUNTER — Encounter (HOSPITAL_COMMUNITY): Payer: Self-pay | Admitting: Psychology

## 2013-08-11 ENCOUNTER — Ambulatory Visit (INDEPENDENT_AMBULATORY_CARE_PROVIDER_SITE_OTHER): Payer: MEDICAID | Admitting: Psychology

## 2013-08-11 DIAGNOSIS — F3289 Other specified depressive episodes: Secondary | ICD-10-CM

## 2013-08-11 DIAGNOSIS — F4310 Post-traumatic stress disorder, unspecified: Secondary | ICD-10-CM

## 2013-08-11 DIAGNOSIS — F411 Generalized anxiety disorder: Secondary | ICD-10-CM

## 2013-08-11 DIAGNOSIS — F329 Major depressive disorder, single episode, unspecified: Secondary | ICD-10-CM

## 2013-08-11 DIAGNOSIS — F4312 Post-traumatic stress disorder, chronic: Secondary | ICD-10-CM
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
## 2013-08-15 ENCOUNTER — Ambulatory Visit (HOSPITAL_COMMUNITY): Payer: Self-pay | Admitting: Psychology

## 2013-08-27 ENCOUNTER — Telehealth: Payer: Self-pay | Admitting: Internal Medicine

## 2013-08-27 NOTE — Telephone Encounter (Signed)
lmomtcb x1 for pt 

## 2013-08-28 ENCOUNTER — Ambulatory Visit (HOSPITAL_COMMUNITY): Payer: Self-pay | Admitting: Psychiatry

## 2013-08-28 NOTE — Telephone Encounter (Signed)
I called and spoke with pt. She reports a couple weeks ago the workers comp stopped her checks. She was facing eviction. She finally received a check in the mail and now has to catch up on bills. She reports once she is ready to get set up with CPAP she will let us know. She cancelled pending appt. Nothing further needed

## 2013-09-01 ENCOUNTER — Ambulatory Visit (HOSPITAL_COMMUNITY): Payer: Self-pay | Admitting: Psychology

## 2013-09-08 ENCOUNTER — Telehealth (HOSPITAL_COMMUNITY): Payer: Self-pay | Admitting: *Deleted

## 2013-09-08 NOTE — Telephone Encounter (Signed)
I have never seen this patient

## 2013-09-09 ENCOUNTER — Ambulatory Visit (HOSPITAL_COMMUNITY): Payer: Self-pay | Admitting: Psychology

## 2013-09-10 ENCOUNTER — Ambulatory Visit (HOSPITAL_COMMUNITY): Payer: Self-pay | Admitting: Psychiatry

## 2013-09-30 ENCOUNTER — Ambulatory Visit: Payer: Self-pay | Admitting: Internal Medicine

## 2013-10-07 ENCOUNTER — Telehealth (HOSPITAL_COMMUNITY): Payer: Self-pay | Admitting: *Deleted

## 2013-12-08 ENCOUNTER — Telehealth: Payer: Self-pay | Admitting: Internal Medicine

## 2013-12-08 NOTE — Telephone Encounter (Signed)
Called spoke with pt. She is scheduled to come in and see CDY tomorrow 12/09/13 at 9:30 AM. Nothing further needed

## 2013-12-09 ENCOUNTER — Encounter: Payer: Self-pay | Admitting: Internal Medicine

## 2013-12-09 ENCOUNTER — Ambulatory Visit (INDEPENDENT_AMBULATORY_CARE_PROVIDER_SITE_OTHER): Payer: Medicaid Other | Admitting: Internal Medicine

## 2013-12-09 VITALS — BP 120/74 | HR 68 | Ht 65.0 in | Wt 329.6 lb

## 2013-12-09 DIAGNOSIS — J3089 Other allergic rhinitis: Secondary | ICD-10-CM

## 2013-12-09 DIAGNOSIS — G4733 Obstructive sleep apnea (adult) (pediatric): Secondary | ICD-10-CM

## 2013-12-09 DIAGNOSIS — F411 Generalized anxiety disorder: Secondary | ICD-10-CM

## 2013-12-09 DIAGNOSIS — J302 Other seasonal allergic rhinitis: Secondary | ICD-10-CM

## 2013-12-09 DIAGNOSIS — J309 Allergic rhinitis, unspecified: Secondary | ICD-10-CM

## 2013-12-09 NOTE — Patient Instructions (Signed)
I agree you want to discuss this issue with your psychiatrist. You can also talk with him about whether a neurologist should see you if you have more episodes of trouble recognizing and reading out loud.
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
## 2013-12-10 ENCOUNTER — Telehealth: Payer: Self-pay | Admitting: Internal Medicine

## 2013-12-10 NOTE — Telephone Encounter (Signed)
LMOm x 1

## 2013-12-10 NOTE — Telephone Encounter (Signed)
Called Catherine Turner back from [facility] States that she had received messages from [place] from our office this week. States that a message was left for a CPAP D/L on 12/08/13 Pt is not serviced through [facility] for CPAP. Catherine Turner states that if patient is wanting to switch DME then they will be more than happy to service her but will need an order. ---- I have spoken with patient and she is not sure what is going on.   Please advise Joellen Jersey as there is no message in pt chart about getting CPAP D/L or orders. Thanks.

## 2013-12-11 NOTE — Telephone Encounter (Signed)
Pt was seen 12-08-13 in the office for an acute visit-looking back through patient's chart-an order was sent to [facility] 07-2013 for CPAP set up for patient. I called to get DL report if possible. Once patient arrived to appt she made [place] aware that she had not gotten her CPAP yet (her doings) and CY was aware of this. Nothing more is needed at this time.

## 2013-12-16 ENCOUNTER — Encounter (HOSPITAL_COMMUNITY): Payer: Self-pay | Admitting: Psychology

## 2013-12-16 ENCOUNTER — Ambulatory Visit (INDEPENDENT_AMBULATORY_CARE_PROVIDER_SITE_OTHER): Payer: MEDICAID | Admitting: Psychology

## 2013-12-16 DIAGNOSIS — F329 Major depressive disorder, single episode, unspecified: Secondary | ICD-10-CM

## 2013-12-16 DIAGNOSIS — F3289 Other specified depressive episodes: Secondary | ICD-10-CM

## 2013-12-16 DIAGNOSIS — F411 Generalized anxiety disorder: Secondary | ICD-10-CM

## 2013-12-16 DIAGNOSIS — F4312 Post-traumatic stress disorder, chronic: Secondary | ICD-10-CM

## 2013-12-16 DIAGNOSIS — F4310 Post-traumatic stress disorder, unspecified: Secondary | ICD-10-CM
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
## 2013-12-17 ENCOUNTER — Ambulatory Visit (HOSPITAL_COMMUNITY): Payer: Self-pay | Admitting: Psychiatry

## 2013-12-22 ENCOUNTER — Other Ambulatory Visit (HOSPITAL_COMMUNITY): Payer: Self-pay | Admitting: Psychiatry

## 2013-12-24 ENCOUNTER — Ambulatory Visit (INDEPENDENT_AMBULATORY_CARE_PROVIDER_SITE_OTHER): Payer: MEDICAID | Admitting: Psychiatry

## 2013-12-24 ENCOUNTER — Encounter (HOSPITAL_COMMUNITY): Payer: Self-pay | Admitting: Psychiatry

## 2013-12-24 VITALS — BP 130/82 | Ht 65.0 in | Wt 326.0 lb

## 2013-12-24 DIAGNOSIS — F4310 Post-traumatic stress disorder, unspecified: Secondary | ICD-10-CM

## 2013-12-24 DIAGNOSIS — F329 Major depressive disorder, single episode, unspecified: Secondary | ICD-10-CM

## 2013-12-24 DIAGNOSIS — F4312 Post-traumatic stress disorder, chronic: Secondary | ICD-10-CM

## 2013-12-24 DIAGNOSIS — F3289 Other specified depressive episodes: Secondary | ICD-10-CM

## 2013-12-24 MED ORDER — GABAPENTIN 100 MG PO CAPS: 100.0000 mg | ORAL_CAPSULE | Freq: Three times a day (TID) | ORAL | Status: DC

## 2013-12-24 MED ORDER — FLUOXETINE HCL 20 MG PO CAPS: 20.0000 mg | ORAL_CAPSULE | Freq: Every day | ORAL | Status: DC
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
## 2013-12-26 ENCOUNTER — Telehealth (HOSPITAL_COMMUNITY): Payer: Self-pay | Admitting: *Deleted

## 2013-12-26 NOTE — Telephone Encounter (Signed)
Called, left message

## 2013-12-31 ENCOUNTER — Telehealth: Payer: Self-pay | Admitting: Internal Medicine

## 2013-12-31 NOTE — Telephone Encounter (Signed)
Spoke with patient-she states that her friend is letting her use his CPAP machine and she wanted to know what to do about getting the CPAP pressure set on that machine. Pt aware she need to contact [facility] to let them know the make and model of the CPAP machine and make sure that can even use the machine first and they have the settings for her. Pt will contact [facility]-nothing further needed at this time.

## 2014-01-02 ENCOUNTER — Telehealth (HOSPITAL_COMMUNITY): Payer: Self-pay | Admitting: *Deleted

## 2014-01-02 NOTE — Telephone Encounter (Signed)
noted 

## 2014-01-06 ENCOUNTER — Ambulatory Visit (HOSPITAL_COMMUNITY): Payer: Self-pay | Admitting: Psychology

## 2014-01-18 NOTE — Assessment & Plan Note (Signed)
She is counting on Workmen's Compensation award to pay for CPAP

## 2014-01-18 NOTE — Assessment & Plan Note (Signed)
Her difficulties reading in public sound very much like anxiety Plan-reassurance

## 2014-01-18 NOTE — Assessment & Plan Note (Signed)
Exam in the office does not match her description of routine cough and shortness of breath

## 2014-01-19 ENCOUNTER — Ambulatory Visit (HOSPITAL_COMMUNITY): Payer: Self-pay | Admitting: Psychiatry

## 2014-02-18 ENCOUNTER — Encounter (HOSPITAL_COMMUNITY): Payer: Self-pay | Admitting: Psychiatry

## 2014-02-18 ENCOUNTER — Telehealth: Payer: Self-pay | Admitting: Internal Medicine

## 2014-02-18 ENCOUNTER — Ambulatory Visit (INDEPENDENT_AMBULATORY_CARE_PROVIDER_SITE_OTHER): Payer: MEDICAID | Admitting: Psychiatry

## 2014-02-18 ENCOUNTER — Ambulatory Visit (HOSPITAL_COMMUNITY): Payer: Self-pay | Admitting: Psychiatry

## 2014-02-18 VITALS — BP 120/78 | Ht 65.0 in | Wt 329.0 lb

## 2014-02-18 DIAGNOSIS — F3289 Other specified depressive episodes: Secondary | ICD-10-CM

## 2014-02-18 DIAGNOSIS — F329 Major depressive disorder, single episode, unspecified: Secondary | ICD-10-CM

## 2014-02-18 MED ORDER — FLUOXETINE HCL 20 MG PO CAPS: 20.0000 mg | ORAL_CAPSULE | Freq: Every day | ORAL | Status: DC

## 2014-02-18 MED ORDER — GABAPENTIN 100 MG PO CAPS: ORAL_CAPSULE | ORAL | Status: DC

## 2014-02-18 NOTE — Telephone Encounter (Signed)
Needs an appt, nothing further needed at this time.
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
## 2014-03-03 ENCOUNTER — Telehealth: Payer: Self-pay | Admitting: Internal Medicine

## 2014-03-03 DIAGNOSIS — G4733 Obstructive sleep apnea (adult) (pediatric): Secondary | ICD-10-CM

## 2014-03-03 NOTE — Telephone Encounter (Signed)
Called and spoke with pt and she stated that she was just accepted for medicaid on Friday and she spoke with [facility] and they advised her to call our office to see if we can send this order to them today as a new order so medicaid will cover the cpap for the pt.  CY please advise. Thanks  Last ov--12/09/2013 Next ov--04-07-2014  Allergies  Allergen Reactions  . Advair Diskus [Fluticasone-Salmeterol] Other (See Comments)    Blood in urine  . Jasmine Fragrance Anaphylaxis  . Diclofenac   . Eggs Or Egg-Derived Products   . Lactase   . Molds & Smuts   . Pravastatin   . Latex Rash  . Pork-Derived Products Rash  . Sulfa Antibiotics Rash    Current Outpatient Prescriptions on File Prior to Visit  Medication Sig Dispense Refill  . acetaminophen (TYLENOL) 650 MG CR tablet Take 1,300 mg by mouth 3 (three) times daily as needed for pain.       Marland Kitchen albuterol (PROVENTIL HFA;VENTOLIN HFA) 108 (90 BASE) MCG/ACT inhaler Inhale 2 puffs into the lungs every 4 (four) hours as needed for wheezing.  1 Inhaler  0  . albuterol (PROVENTIL) (2.5 MG/3ML) 0.083% nebulizer solution Take 2.5 mg by nebulization every 6 (six) hours as needed for wheezing.      . benzonatate (TESSALON) 100 MG capsule Take 100 mg by mouth every 6 (six) hours as needed for cough.       . desloratadine (CLARINEX) 5 MG tablet Take 5 mg by mouth daily as needed (allergies).      Marland Kitchen dextromethorphan-guaiFENesin (MUCINEX DM) 30-600 MG per 12 hr tablet Take 1 tablet by mouth daily as needed (for congestion).      . diphenhydrAMINE (BENADRYL) 25 MG tablet Take 25 mg by mouth every 8 (eight) hours as needed for allergies.       Marland Kitchen FLUoxetine (PROZAC) 20 MG capsule Take 1 capsule (20 mg total) by mouth daily.  30 capsule  2  . fluticasone (FLOVENT HFA) 44 MCG/ACT inhaler Inhale 2 puffs into the lungs 2 (two) times daily.       Marland Kitchen gabapentin (NEURONTIN) 100 MG capsule Take one twice a day and two at night  120 capsule  2  .  HYDROcodone-acetaminophen (NORCO/VICODIN) 5-325 MG per tablet Take 1 tablet by mouth 2 (two) times daily as needed for pain.      Marland Kitchen levothyroxine (SYNTHROID, LEVOTHROID) 100 MCG tablet Take 100 mcg by mouth every morning.       Marland Kitchen lisinopril (PRINIVIL,ZESTRIL) 10 MG tablet Take 10 mg by mouth every morning.      . Melatonin 10 MG TABS Take 1 tablet by mouth at bedtime.      . Multiple Vitamin (MULTIVITAMIN WITH MINERALS) TABS Take 1 tablet by mouth daily.      . naproxen (NAPROSYN) 500 MG tablet Take 500 mg by mouth 2 (two) times daily as needed (pain).       . pseudoephedrine-dextromethorphan-guaifenesin (ROBITUSSIN-PE) 30-10-100 MG/5ML solution Take 10 mLs by mouth 4 (four) times daily as needed for cough.       No current facility-administered medications on file prior to visit.

## 2014-03-03 NOTE — Telephone Encounter (Signed)
Called and spoke with pt and she is aware of order placed for DME for the CPAP.  Pt is aware we will get the sent in for her.  Nothing further is needed.

## 2014-03-03 NOTE — Telephone Encounter (Signed)
Order- DME [facility] CPAP 11, mask of choice, humidifier, supplies    Dx OSA

## 2014-03-06 ENCOUNTER — Ambulatory Visit (HOSPITAL_COMMUNITY): Payer: Self-pay | Admitting: Psychology

## 2014-03-18 ENCOUNTER — Ambulatory Visit (INDEPENDENT_AMBULATORY_CARE_PROVIDER_SITE_OTHER): Payer: MEDICAID | Admitting: Psychiatry

## 2014-03-18 ENCOUNTER — Encounter (HOSPITAL_COMMUNITY): Payer: Self-pay | Admitting: Psychiatry

## 2014-03-18 VITALS — Ht 65.0 in | Wt 324.0 lb

## 2014-03-18 DIAGNOSIS — F329 Major depressive disorder, single episode, unspecified: Secondary | ICD-10-CM

## 2014-03-18 DIAGNOSIS — F4310 Post-traumatic stress disorder, unspecified: Secondary | ICD-10-CM

## 2014-03-18 DIAGNOSIS — F3289 Other specified depressive episodes: Secondary | ICD-10-CM

## 2014-03-18 MED ORDER — GABAPENTIN 100 MG PO CAPS: ORAL_CAPSULE | ORAL | Status: DC

## 2014-03-18 MED ORDER — ESCITALOPRAM OXALATE 10 MG PO TABS: 10.0000 mg | ORAL_TABLET | Freq: Every day | ORAL | Status: DC
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
## 2014-03-23 ENCOUNTER — Encounter (HOSPITAL_COMMUNITY): Payer: Self-pay | Admitting: Psychology

## 2014-03-23 ENCOUNTER — Ambulatory Visit (HOSPITAL_COMMUNITY): Payer: MEDICAID | Admitting: Psychology

## 2014-03-23 DIAGNOSIS — F4310 Post-traumatic stress disorder, unspecified: Secondary | ICD-10-CM

## 2014-03-23 DIAGNOSIS — F3289 Other specified depressive episodes: Secondary | ICD-10-CM

## 2014-03-23 DIAGNOSIS — F4312 Post-traumatic stress disorder, chronic: Secondary | ICD-10-CM

## 2014-03-23 DIAGNOSIS — F329 Major depressive disorder, single episode, unspecified: Secondary | ICD-10-CM
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
## 2014-03-30 ENCOUNTER — Telehealth: Payer: Self-pay | Admitting: Internal Medicine

## 2014-03-30 DIAGNOSIS — G4733 Obstructive sleep apnea (adult) (pediatric): Secondary | ICD-10-CM

## 2014-03-30 NOTE — Telephone Encounter (Signed)
Order- DME- [facility] CPAP 5-20 cwp x 7 days, download for pressure recommendation    Dx OSA

## 2014-03-30 NOTE — Telephone Encounter (Signed)
LMTCB

## 2014-03-30 NOTE — Telephone Encounter (Signed)
Called, spoke with pt.  Informed her of below per CY.  She verbalized understanding and is aware order will be sent to have this done. Dr. Annamaria Boots, pt states the ramp was set on auto but was advised to have this turned off for now.  She would like to know if she should have this on auto or off during 7 day autotitrate.  Please advise -- thank you.

## 2014-03-30 NOTE — Telephone Encounter (Signed)
Spoke with the pt  She states CPAP not working well set at 11 cm  She is waking up gasping for air and having a sharp pain under rt shoulder blade  She states that the pain was present before, and she is seeing chiropractor for this, but pain seems more pronounced since started CPAP  Pt has upcoming appt 04/07/14  Please advise, thanks! Allergies  Allergen Reactions  . Advair Diskus [Fluticasone-Salmeterol] Other (See Comments)    Blood in urine  . Jasmine Fragrance Anaphylaxis  . Diclofenac   . Eggs Or Egg-Derived Products   . Lactase   . Molds & Smuts   . Pravastatin   . Latex Rash  . Pork-Derived Products Rash  . Sulfa Antibiotics Rash   Current Outpatient Prescriptions on File Prior to Visit  Medication Sig Dispense Refill  . acetaminophen (TYLENOL) 650 MG CR tablet Take 1,300 mg by mouth 3 (three) times daily as needed for pain.       Marland Kitchen albuterol (PROVENTIL HFA;VENTOLIN HFA) 108 (90 BASE) MCG/ACT inhaler Inhale 2 puffs into the lungs every 4 (four) hours as needed for wheezing.  1 Inhaler  0  . albuterol (PROVENTIL) (2.5 MG/3ML) 0.083% nebulizer solution Take 2.5 mg by nebulization every 6 (six) hours as needed for wheezing.      . benzonatate (TESSALON) 100 MG capsule Take 100 mg by mouth every 6 (six) hours as needed for cough.       . desloratadine (CLARINEX) 5 MG tablet Take 5 mg by mouth daily as needed (allergies).      Marland Kitchen dextromethorphan-guaiFENesin (MUCINEX DM) 30-600 MG per 12 hr tablet Take 1 tablet by mouth daily as needed (for congestion).      . diphenhydrAMINE (BENADRYL) 25 MG tablet Take 25 mg by mouth every 8 (eight) hours as needed for allergies.       Marland Kitchen escitalopram (LEXAPRO) 10 MG tablet Take 1 tablet (10 mg total) by mouth daily.  30 tablet  2  . fluticasone (FLOVENT HFA) 44 MCG/ACT inhaler Inhale 2 puffs into the lungs 2 (two) times daily.       Marland Kitchen gabapentin (NEURONTIN) 100 MG capsule Take one twice a day and two at night  120 capsule  2  .  HYDROcodone-acetaminophen (NORCO/VICODIN) 5-325 MG per tablet Take 1 tablet by mouth 2 (two) times daily as needed for pain.      Marland Kitchen levothyroxine (SYNTHROID, LEVOTHROID) 100 MCG tablet Take 100 mcg by mouth every morning.       Marland Kitchen lisinopril (PRINIVIL,ZESTRIL) 10 MG tablet Take 10 mg by mouth every morning.      . Melatonin 10 MG TABS Take 1 tablet by mouth at bedtime.      . Multiple Vitamin (MULTIVITAMIN WITH MINERALS) TABS Take 1 tablet by mouth daily.      . naproxen (NAPROSYN) 500 MG tablet Take 500 mg by mouth 2 (two) times daily as needed (pain).       . pseudoephedrine-dextromethorphan-guaifenesin (ROBITUSSIN-PE) 30-10-100 MG/5ML solution Take 10 mLs by mouth 4 (four) times daily as needed for cough.       No current facility-administered medications on file prior to visit.

## 2014-03-31 NOTE — Telephone Encounter (Signed)
Spoke with pt, she is aware that [facility] (who handles her cpap) will help her with that.  Verified appt on 04/07/14.  Nothing further needed at this time.

## 2014-03-31 NOTE — Telephone Encounter (Signed)
DME company will help her with these questions

## 2014-04-06 ENCOUNTER — Encounter (HOSPITAL_COMMUNITY): Payer: Self-pay | Admitting: Emergency Medicine

## 2014-04-06 ENCOUNTER — Emergency Department (HOSPITAL_COMMUNITY): Payer: Medicaid Other

## 2014-04-06 ENCOUNTER — Emergency Department (HOSPITAL_COMMUNITY)
Admission: EM | Admit: 2014-04-06 | Discharge: 2014-04-06 | Disposition: A | Discharge status: Home / Self Care | Payer: Medicaid Other | Attending: Emergency Medicine | Admitting: Emergency Medicine

## 2014-04-06 DIAGNOSIS — Z791 Long term (current) use of non-steroidal anti-inflammatories (NSAID): Secondary | ICD-10-CM | POA: Insufficient documentation

## 2014-04-06 DIAGNOSIS — G8929 Other chronic pain: Secondary | ICD-10-CM | POA: Diagnosis not present

## 2014-04-06 DIAGNOSIS — Z8781 Personal history of (healed) traumatic fracture: Secondary | ICD-10-CM | POA: Diagnosis not present

## 2014-04-06 DIAGNOSIS — Z9981 Dependence on supplemental oxygen: Secondary | ICD-10-CM | POA: Insufficient documentation

## 2014-04-06 DIAGNOSIS — E079 Disorder of thyroid, unspecified: Secondary | ICD-10-CM | POA: Insufficient documentation

## 2014-04-06 DIAGNOSIS — M25511 Pain in right shoulder: Secondary | ICD-10-CM

## 2014-04-06 DIAGNOSIS — M25519 Pain in unspecified shoulder: Secondary | ICD-10-CM | POA: Diagnosis present

## 2014-04-06 DIAGNOSIS — IMO0002 Reserved for concepts with insufficient information to code with codable children: Secondary | ICD-10-CM | POA: Insufficient documentation

## 2014-04-06 DIAGNOSIS — G4733 Obstructive sleep apnea (adult) (pediatric): Secondary | ICD-10-CM

## 2014-04-06 DIAGNOSIS — Z79899 Other long term (current) drug therapy: Secondary | ICD-10-CM | POA: Insufficient documentation

## 2014-04-06 DIAGNOSIS — Z91040 Latex allergy status: Secondary | ICD-10-CM | POA: Insufficient documentation

## 2014-04-06 DIAGNOSIS — J441 Chronic obstructive pulmonary disease with (acute) exacerbation: Secondary | ICD-10-CM | POA: Insufficient documentation

## 2014-04-06 DIAGNOSIS — Z8659 Personal history of other mental and behavioral disorders: Secondary | ICD-10-CM | POA: Insufficient documentation

## 2014-04-06 DIAGNOSIS — R52 Pain, unspecified: Secondary | ICD-10-CM | POA: Insufficient documentation

## 2014-04-06 DIAGNOSIS — J45901 Unspecified asthma with (acute) exacerbation: Secondary | ICD-10-CM

## 2014-04-06 LAB — BASIC METABOLIC PANEL
ANION GAP: 8 (ref 5–15)
BUN: 7 mg/dL (ref 6–23)
CALCIUM: 9.1 mg/dL (ref 8.4–10.5)
CO2: 29 meq/L (ref 19–32)
Chloride: 107 mEq/L (ref 96–112)
Creatinine, Ser: 1.07 mg/dL (ref 0.50–1.10)
GFR calc Af Amer: 66 mL/min — ABNORMAL LOW (ref >90)
GFR, EST NON AFRICAN AMERICAN: 57 mL/min — AB (ref >90)
GLUCOSE: 97 mg/dL (ref 70–99)
POTASSIUM: 4 meq/L (ref 3.7–5.3)
SODIUM: 144 meq/L (ref 137–147)

## 2014-04-06 LAB — CBC WITH DIFFERENTIAL/PLATELET
BASOS ABS: 0 10*3/uL (ref 0.0–0.1)
Basophils Relative: 1 % (ref 0–1)
EOS ABS: 0.3 10*3/uL (ref 0.0–0.7)
EOS PCT: 5 % (ref 0–5)
HCT: 35.7 % — ABNORMAL LOW (ref 36.0–46.0)
Hemoglobin: 11.9 g/dL — ABNORMAL LOW (ref 12.0–15.0)
LYMPHS ABS: 1.7 10*3/uL (ref 0.7–4.0)
LYMPHS PCT: 33 % (ref 12–46)
MCH: 29.1 pg (ref 26.0–34.0)
MCHC: 33.3 g/dL (ref 30.0–36.0)
MCV: 87.3 fL (ref 78.0–100.0)
Monocytes Absolute: 0.4 10*3/uL (ref 0.1–1.0)
Monocytes Relative: 7 % (ref 3–12)
NEUTROS PCT: 54 % (ref 43–77)
Neutro Abs: 2.9 10*3/uL (ref 1.7–7.7)
PLATELETS: 132 10*3/uL — AB (ref 150–400)
RBC: 4.09 MIL/uL (ref 3.87–5.11)
RDW: 14.1 % (ref 11.5–15.5)
WBC: 5.3 10*3/uL (ref 4.0–10.5)

## 2014-04-06 LAB — TROPONIN I

## 2014-04-06 LAB — PRO B NATRIURETIC PEPTIDE: PRO B NATRI PEPTIDE: 338.6 pg/mL — AB (ref 0–125)

## 2014-04-06 MED ORDER — HYDROCODONE-ACETAMINOPHEN 5-325 MG PO TABS
2.0000 | ORAL_TABLET | Freq: Once | ORAL | Status: AC
  Administered 2014-04-06: 2 via ORAL
  Filled 2014-04-06: qty 2

## 2014-04-06 MED ORDER — HYDROCODONE-ACETAMINOPHEN 5-325 MG PO TABS: 1.0000 | ORAL_TABLET | ORAL | Status: DC | PRN

## 2014-04-06 NOTE — ED Notes (Signed)
No change for  Assessment done at 1025.

## 2014-04-06 NOTE — ED Notes (Signed)
MD at bedside. 

## 2014-04-06 NOTE — ED Provider Notes (Signed)
TIME SEEN: 1:22PM  CHIEF COMPLAINT: right arm and shoulder pain   HPI: HPI Comments: Catherine Turner is a 57 y.o. female who was brought to the Emergency Department by EMS with several complaints that were exacerbated upon waking today. She states that she currently is experiencing the most distress from her right arm and shoulder and reports a prior arm fracture in 2013 with chronic pain since then, but reports that pain has been worse recently; she denies any new injuries to her arm.   She also reports shortness of breath and a throbbing headache with onset upon waking; she reports history of severe sleep apnea and COPD and states she recently began CPAP use. No thunderclap headache or worst headache of her life. She's had similar headaches in the past and she feels is related to tension and her shoulder. She reports associated low-grade fever and allergy-related cough last night. She also reports associated chest pressure. She reports that her SOB and chest pressure are worse when lying flat and have been present since 2013.   Denies numbness, tingling or focal weakness.   ROS: See HPI Constitutional: low-grade fever Eyes: no drainage  ENT: no runny nose   Cardiovascular: chest tightness Resp: SOB  GI: no vomiting GU: no dysuria Integumentary: no rash  Allergy: no hives  Musculoskeletal: right arm and shoulder pain, leg swelling Neurological: headache, no slurred speech ROS otherwise negative  PAST MEDICAL HISTORY/PAST SURGICAL HISTORY:  Past Medical History  Diagnosis Date  . Asthma   . COPD (chronic obstructive pulmonary disease)   . Arm fracture     right arm   . DJD (degenerative joint disease) of hip   . Thyroid disease   . Hip pain   . PTSD (post-traumatic stress disorder)   . Insomnia     MEDICATIONS:  Prior to Admission medications   Medication Sig Start Date End Date Taking? Authorizing Provider  albuterol (PROVENTIL HFA;VENTOLIN HFA) 108 (90 BASE) MCG/ACT  inhaler Inhale 2 puffs into the lungs every 4 (four) hours as needed for wheezing. 10/01/12  Yes Ezequiel Essex, MD  albuterol (PROVENTIL) (2.5 MG/3ML) 0.083% nebulizer solution Take 2.5 mg by nebulization every 6 (six) hours as needed for wheezing. 04/12/12  Yes Francine Graven, DO  calcium elemental as carbonate (BARIATRIC TUMS ULTRA) 400 MG tablet Chew 1,000 mg by mouth at bedtime as needed (GERD).   Yes Historical Provider, MD  desloratadine (CLARINEX) 5 MG tablet Take 5 mg by mouth daily as needed (allergies).   Yes Historical Provider, MD  diphenhydrAMINE (BENADRYL) 25 MG tablet Take 50 mg by mouth every 8 (eight) hours as needed for allergies.    Yes Historical Provider, MD  escitalopram (LEXAPRO) 10 MG tablet Take 1 tablet (10 mg total) by mouth daily. 03/18/14 03/18/15 Yes Levonne Spiller, MD  fluticasone (FLOVENT HFA) 44 MCG/ACT inhaler Inhale 2 puffs into the lungs 2 (two) times daily.    Yes Historical Provider, MD  levothyroxine (SYNTHROID, LEVOTHROID) 100 MCG tablet Take 100 mcg by mouth every morning.    Yes Historical Provider, MD  lisinopril (PRINIVIL,ZESTRIL) 10 MG tablet Take 10 mg by mouth every morning.   Yes Historical Provider, MD  Melatonin 10 MG TABS Take 1 tablet by mouth at bedtime.   Yes Historical Provider, MD  naproxen (NAPROSYN) 500 MG tablet Take 500 mg by mouth 2 (two) times daily as needed (pain).    Yes Historical Provider, MD    ALLERGIES:  Allergies  Allergen Reactions  . Advair  Diskus [Fluticasone-Salmeterol] Other (See Comments)    Blood in urine  . Jasmine Fragrance Anaphylaxis  . Diclofenac Other (See Comments)    unknown  . Eggs Or Egg-Derived Products Other (See Comments)    "Nose runs"  . Lactase Other (See Comments)    unknown  . Molds & Smuts Other (See Comments)    unknown  . Pravastatin Other (See Comments)    unknown  . Latex Rash  . Pork-Derived Products Rash  . Sulfa Antibiotics Rash    SOCIAL HISTORY:  History  Substance Use Topics  .  Smoking status: Passive Smoke Exposure - Never Smoker  . Smokeless tobacco: Never Used  . Alcohol Use: No    FAMILY HISTORY: Family History  Problem Relation Age of Onset  . COPD Mother   . Cancer - Lung Mother   . Hypertension Father   . Drug abuse Sister   . Dementia Maternal Aunt   . Heart disease Maternal Grandfather   . Heart disease Maternal Grandmother   . Heart disease Paternal Grandfather   . Heart attack Paternal Grandfather   . Heart disease Paternal Grandmother   . ADD / ADHD Neg Hx   . Bipolar disorder Neg Hx   . OCD Neg Hx   . Paranoid behavior Neg Hx   . Schizophrenia Neg Hx   . Seizures Neg Hx   . Sexual abuse Neg Hx   . Physical abuse Other   . Anxiety disorder Other   . Birth defects Other     [name]  . Alcohol abuse Paternal Aunt   . Alcohol abuse Paternal Uncle     EXAM: BP 132/62  Pulse 57  Temp(Src) 98.1 F (36.7 C) (Oral)  Resp 19  Ht 5\' 5"  (1.651 m)  Wt 325 lb 6 oz (147.589 kg)  BMI 54.15 kg/m2  SpO2 99% CONSTITUTIONAL: Alert and oriented and responds appropriately to questions. Well-appearing; well-nourished HEAD: Normocephalic EYES: Conjunctivae clear, PERRL ENT: normal nose; no rhinorrhea; moist mucous membranes; pharynx without lesions noted NECK: Supple, no meningismus, no LAD, no midline spinal tenderness or step-off or deformity CARD: RRR; S1 and S2 appreciated; no murmurs, no clicks, no rubs, no gallops RESP: Normal chest excursion without splinting or tachypnea; breath sounds clear and equal bilaterally; no wheezes, no rhonchi, no rales,  ABD/GI: Normal bowel sounds; non-distended; soft, non-tender, no rebound, no guarding BACK:  The back appears normal and is non-tender to palpation, there is no CVA tenderness EXT: Nontender to palpation over the right sternocleidomastoid and trapezius muscle, no bony deformity or sign of dislocation, no signs of joint effusion or septic arthritis, Normal ROM in all joints; non-tender to  palpation; no edema; normal capillary refill; no cyanosis    SKIN: Normal color for age and race; warm NEURO: Moves all extremities equally; sensation to light touch intact diffusely, cranial 2 through contact, strength 5/5 in all 4 extremities, to proceed to reflexes in bilateral upper and lower extremities PSYCH: The patient's mood and manner are appropriate. Grooming and personal hygiene are appropriate.  MEDICAL DECISION MAKING: Patient here with multiple complaints. I feel her neck and shoulder pain that has been since an accident 2013 are musculoskeletal in nature. Reproducible with palpation of her sternocleidomastoid and trapezius muscle. We'll give Vicodin. No injury recently and therefore do not feel she needs imaging. No midline neck pain or neurologic deficits to suggest radiculopathy.  Her chest pressure and shortness of breath is worse with lying flat has been present also  since 2013. Doubt any life-threatening illness. EKG shows no ischemic changes or arrhythmia. We'll obtain cardiac labs including troponin and BNP, chest x-ray. Anticipate if negative, patient can follow up with her primary care physician. She states she has an appointment to see a pulmonologist tomorrow for the symptoms. Discussed with her that this may all be related to her morbid obesity and sleep apnea. Have recommended close outpatient followup.  ED PROGRESS: Workup has been unremarkable. Patient is a hemodynamically stable. We'll discharge home. Discussed strict return precautions and supportive care instructions. She verbalized understanding and is comfortable plan. She has outpatient followup.      EKG Interpretation  Date/Time:  Monday April 06 2014 13:41:26 EDT Ventricular Rate:  58 PR Interval:  148 QRS Duration: 99 QT Interval:  427 QTC Calculation: 419 R Axis:   75 Text Interpretation:  Sinus rhythm Confirmed by Lyon Dumont,  DO, Annalisse Minkoff ([id number]) on 04/06/2014 1:56:21 PM      This chart was scribed for  [place], DO by Edison Simon, ED Scribe. This patient was seen in room APA10/[facility]10.     I personally performed the services described in this documentation, which was scribed in my presence. The recorded information has been reviewed and is accurate.     [place], DO 04/06/14 1605

## 2014-04-06 NOTE — ED Notes (Signed)
Dr. Ward at bedside.

## 2014-04-06 NOTE — ED Notes (Signed)
Having SOB, headache, and chest tightness. Pt. States,  "Having more arm pain than anything else."

## 2014-04-06 NOTE — Discharge Instructions (Signed)
Shoulder Pain The shoulder is the joint that connects your arms to your body. The bones that form the shoulder joint include the upper arm bone (humerus), the shoulder blade (scapula), and the collarbone (clavicle). The top of the humerus is shaped like a ball and fits into a rather flat socket on the scapula (glenoid cavity). A combination of muscles and strong, fibrous tissues that connect muscles to bones (tendons) support your shoulder joint and hold the ball in the socket. Small, fluid-filled sacs (bursae) are located in different areas of the joint. They act as cushions between the bones and the overlying soft tissues and help reduce friction between the gliding tendons and the bone as you move your arm. Your shoulder joint allows a wide range of motion in your arm. This range of motion allows you to do things like scratch your back or throw a ball. However, this range of motion also makes your shoulder more prone to pain from overuse and injury. Causes of shoulder pain can originate from both injury and overuse and usually can be grouped in the following four categories:  Redness, swelling, and pain (inflammation) of the tendon (tendinitis) or the bursae (bursitis).  Instability, such as a dislocation of the joint.  Inflammation of the joint (arthritis).  Broken bone (fracture). HOME CARE INSTRUCTIONS   Apply ice to the sore area.  Put ice in a plastic bag.  Place a towel between your skin and the bag.  Leave the ice on for 15-20 minutes, 3-4 times per day for the first 2 days, or as directed by your health care provider.  Stop using cold packs if they do not help with the pain.  If you have a shoulder sling or immobilizer, wear it as long as your caregiver instructs. Only remove it to shower or bathe. Move your arm as little as possible, but keep your hand moving to prevent swelling.  Squeeze a soft ball or foam pad as much as possible to help prevent swelling.  Only take  over-the-counter or prescription medicines for pain, discomfort, or fever as directed by your caregiver. SEEK MEDICAL CARE IF:   Your shoulder pain increases, or new pain develops in your arm, hand, or fingers.  Your hand or fingers become cold and numb.  Your pain is not relieved with medicines. SEEK IMMEDIATE MEDICAL CARE IF:   Your arm, hand, or fingers are numb or tingling.  Your arm, hand, or fingers are significantly swollen or turn white or blue. MAKE SURE YOU:   Understand these instructions.  Will watch your condition.  Will get help right away if you are not doing well or get worse. Document Released: 05/03/2005 Document Revised: 12/08/2013 Document Reviewed: 07/08/2011 [facility] Patient Information 2015 [place], [place]. This information is not intended to replace advice given to you by your health care provider. Make sure you discuss any questions you have with your health care provider.  Sleep Apnea  Sleep apnea is a sleep disorder characterized by abnormal pauses in breathing while you sleep. When your breathing pauses, the level of oxygen in your blood decreases. This causes you to move out of deep sleep and into light sleep. As a result, your quality of sleep is poor, and the system that carries your blood throughout your body (cardiovascular system) experiences stress. If sleep apnea remains untreated, the following conditions can develop:  High blood pressure (hypertension).  Coronary artery disease.  Inability to achieve or maintain an erection (impotence).  Impairment of your thought  process (cognitive dysfunction). There are three types of sleep apnea: 1. Obstructive sleep apnea--Pauses in breathing during sleep because of a blocked airway. 2. Central sleep apnea--Pauses in breathing during sleep because the area of the brain that controls your breathing does not send the correct signals to the muscles that control breathing. 3. Mixed sleep apnea--A combination  of both obstructive and central sleep apnea. RISK FACTORS The following risk factors can increase your risk of developing sleep apnea:  Being overweight.  Smoking.  Having narrow passages in your nose and throat.  Being of older age.  Being female.  Alcohol use.  Sedative and tranquilizer use.  Ethnicity. Among individuals younger than 35 years, African Americans are at increased risk of sleep apnea. SYMPTOMS   Difficulty staying asleep.  Daytime sleepiness and fatigue.  Loss of energy.  Irritability.  Loud, heavy snoring.  Morning headaches.  Trouble concentrating.  Forgetfulness.  Decreased interest in sex. DIAGNOSIS  In order to diagnose sleep apnea, your caregiver will perform a physical examination. Your caregiver may suggest that you take a home sleep test. Your caregiver may also recommend that you spend the night in a sleep lab. In the sleep lab, several monitors record information about your heart, lungs, and brain while you sleep. Your leg and arm movements and blood oxygen level are also recorded. TREATMENT The following actions may help to resolve mild sleep apnea:  Sleeping on your side.   Using a decongestant if you have nasal congestion.   Avoiding the use of depressants, including alcohol, sedatives, and narcotics.   Losing weight and modifying your diet if you are overweight. There also are devices and treatments to help open your airway:  Oral appliances. These are custom-made mouthpieces that shift your lower jaw forward and slightly open your bite. This opens your airway.  Devices that create positive airway pressure. This positive pressure "splints" your airway open to help you breathe better during sleep. The following devices create positive airway pressure:  Continuous positive airway pressure (CPAP) device. The CPAP device creates a continuous level of air pressure with an air pump. The air is delivered to your airway through a mask  while you sleep. This continuous pressure keeps your airway open.  Nasal expiratory positive airway pressure (EPAP) device. The EPAP device creates positive air pressure as you exhale. The device consists of single-use valves, which are inserted into each nostril and held in place by adhesive. The valves create very little resistance when you inhale but create much more resistance when you exhale. That increased resistance creates the positive airway pressure. This positive pressure while you exhale keeps your airway open, making it easier to breath when you inhale again.  Bilevel positive airway pressure (BPAP) device. The BPAP device is used mainly in patients with central sleep apnea. This device is similar to the CPAP device because it also uses an air pump to deliver continuous air pressure through a mask. However, with the BPAP machine, the pressure is set at two different levels. The pressure when you exhale is lower than the pressure when you inhale.  Surgery. Typically, surgery is only done if you cannot comply with less invasive treatments or if the less invasive treatments do not improve your condition. Surgery involves removing excess tissue in your airway to create a wider passage way. Document Released: 07/14/2002 Document Revised: 11/18/2012 Document Reviewed: 11/30/2011 [facility] Patient Information 2015 [place], [place]. This information is not intended to replace advice given to you by your health  care provider. Make sure you discuss any questions you have with your health care provider.

## 2014-04-06 NOTE — ED Notes (Signed)
Pt complain of neck and shoulder pain. States she thinks it is tension. States she started escitalopram 10 mg yesterday and has been anxious. Also, states she has pain in her right arm that is chronic

## 2014-04-07 ENCOUNTER — Encounter: Payer: Self-pay | Admitting: Internal Medicine

## 2014-04-07 ENCOUNTER — Ambulatory Visit (INDEPENDENT_AMBULATORY_CARE_PROVIDER_SITE_OTHER): Payer: Medicaid Other | Admitting: Internal Medicine

## 2014-04-07 VITALS — BP 128/76 | HR 62 | Ht 65.0 in | Wt 338.0 lb

## 2014-04-07 DIAGNOSIS — J45902 Unspecified asthma with status asthmaticus: Secondary | ICD-10-CM

## 2014-04-07 DIAGNOSIS — R0989 Other specified symptoms and signs involving the circulatory and respiratory systems: Secondary | ICD-10-CM

## 2014-04-07 DIAGNOSIS — R0609 Other forms of dyspnea: Secondary | ICD-10-CM

## 2014-04-07 DIAGNOSIS — J309 Allergic rhinitis, unspecified: Secondary | ICD-10-CM

## 2014-04-07 DIAGNOSIS — R0600 Dyspnea, unspecified: Secondary | ICD-10-CM

## 2014-04-07 DIAGNOSIS — J3089 Other allergic rhinitis: Secondary | ICD-10-CM

## 2014-04-07 DIAGNOSIS — G4733 Obstructive sleep apnea (adult) (pediatric): Secondary | ICD-10-CM | POA: Diagnosis not present

## 2014-04-07 DIAGNOSIS — J4522 Mild intermittent asthma with status asthmaticus: Secondary | ICD-10-CM

## 2014-04-07 DIAGNOSIS — J302 Other seasonal allergic rhinitis: Secondary | ICD-10-CM

## 2014-04-07 MED ORDER — FLUTICASONE FUROATE-VILANTEROL 100-25 MCG/INH IN AEPB: 1.0000 | INHALATION_SPRAY | Freq: Every day | RESPIRATORY_TRACT | Status: DC

## 2014-04-07 NOTE — Patient Instructions (Addendum)
Order- DME Catherine Turner's AutoPAP CPAP 8-15 cwp      Dx OSA  Order- Office Spirometry    Dx asthma, mild intermittent  Sample Breo ellipta inhaler- 1 puff then rinse mouth well, one time daily  Try this instead of your other inhalers. See if you feel you are breathing better.

## 2014-04-07 NOTE — Progress Notes (Signed)
07/30/13- 58 yo F never smoker self -referred for sleep. 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 After that, noted on occasion that when she lay down, she felt "thumping" R neck and would feel groggy/ "pass out". [facility]. Advised to come here to address sleep. Grew up in smoking family- passive smoker. Told she had asthma and COPD. GERD triggers asthma- now diet controlled.Seasonal allergic rhinitis. HBP.  Taking gabapentin for anxiety and "chaos in my brain".  Lives alone, no sleep witness.  NPSG 04/20/13- AHI 10/ hr. CPAP titration 06/10/13> 11 cwp/ hr w/ F/P Simplus mask. Epworth6/24, BMI 53. Wakes suddenly in night. Bedtime 11-12MN, latency 30 min, WASO x  , Morning wake is variable.   12/09/13- 57 yoF never smoker followed for OSA , asthma/ bronchitis, complicated by GERD, allergic Rhinitis, anxiety ACUTE VISIT: patient states she has not gotten her CPAP machine yet-financial issues. Pt states she has noticed more SOB than usual and would like to be checked on-was not able to breathe well enough to read the Bible outloud at church Sunday. Awaiting Workmen's Compensation settlement to afford CPAP machine Cough and shortness of breath routine Describes anxiety episodes when she has difficulty reading in public. She again describes "the impingement" that causes "thumping" and fainting if she lies on right shoulder. CXR 03/15/13 IMPRESSION:  No active cardiopulmonary disease.  Original Report Authenticated By: Carl Best, M.D  04/07/14- 55 yoF never smoker followed for OSA , asthma/ bronchitis, complicated by GERD, allergic Rhinitis, anxiety, Thoracic Outlet Syndrome Self Referral; New Problem had been seen for OSA 12-2013; Now wants to have lungs checked out. CPAP 11/  machine on 03-24-14-needs help with adjustment on mask( wakes up thorugh the night and cant breathe) She wants to know if she might have COPD. Some cough  and wheeze occasionally- mainly in fall season. Despite CPAP she wakes in the middle of the night gasping but she denies reflux or coughing during the night. When she is outdoors her eyes and nose walker so she wears a mask. Easy dyspnea on exertion climbing hills. History of asthma, reflux, thoracic outlet syndrome(blamed for "thumping" in neck when lying down.) She complains of a constant dull pain through the right upper anterior chest-not pleuritic or exertional. Office spirometry 04/07/14- WNL- FVC 2.84/86%, FEV1 2.32/88%, FEV1/FVC 0.82, FEF 25-75% 2.48/94% CXR 04/06/14 FINDINGS:  The lungs are well-expanded and clear. The heart is top-normal in  size. The pulmonary vascularity is not engorged. There is no pleural  effusion. The mediastinum is normal in width. The bony thorax is  unremarkable.  IMPRESSION:  There is no acute cardiopulmonary abnormality.  Electronically Signed  By: David Martinique  On: 04/06/2014 14:04  ROS-see HPI Constitutional:   No-   weight loss, night sweats, fevers, chills, fatigue, lassitude. HEENT:   + headaches, +difficulty swallowing, +tooth/dental problems, sore throat,       No-  sneezing, itching, ear ache, +nasal congestion, +post nasal drip,  CV:  No-   chest pain, orthopnea, PND, swelling in lower extremities, anasarca, dizziness, +palpitations Resp: + shortness of breath with exertion or at rest.              No-productive cough,  + non-productive cough,  No- coughing up of blood.              No-   change in color of mucus.  No- wheezing.   Skin: +rash  GI: + heartburn, indigestion, No- abdominal pain, nausea, vomiting, diarrhea,                 change in bowel habits, loss of appetite GU: No-   dysuria, change in color of urine, no urgency or frequency.  No- flank pain. MS:  + joint pain or swelling.  No- decreased range of motion.  No- back pain. Neuro-    Episodes of? Word agnosia Psych:  No- change in mood or affect. + depression or +anxiety.  No  memory loss.  OBJ- Physical Exam General- Alert, Oriented, Affect-appropriate, Distress- none acute. +Obese Skin- rash-none, lesions- none, excoriation- none Lymphadenopathy- none Head- atraumatic            Eyes- Gross vision intact, PERRLA, conjunctivae and secretions clear            Ears- Hearing, canals-normal            Nose- Clear, no-Septal dev, mucus, polyps, erosion, perforation             Throat- Mallampati III , mucosa clear , drainage- none, tonsils+ present Neck- flexible , trachea midline, no stridor , thyroid nl, carotid- no bruit Chest - symmetrical excursion , unlabored           Heart/CV- RRR , no murmur , no gallop  , no rub, nl s1 s2                           - JVD- none , edema- none, stasis changes- none, varices- none           Lung- clear to P&A, wheeze- none, cough- none , dullness-none, rub- none, +equal radial                           pulses           Chest wall-  Abd- tender-no, distended-no, bowel sounds-present, HSM- no Br/ Gen/ Rectal- Not done, not indicated Extrem- cyanosis- none, clubbing, none, atrophy- none, strength- nl Neuro- grossly intact to observation, speech is articulate

## 2014-04-08 ENCOUNTER — Telehealth: Payer: Self-pay | Admitting: Internal Medicine

## 2014-04-08 NOTE — Telephone Encounter (Signed)
Pt aware of recs.  Nothing further needed. 

## 2014-04-08 NOTE — Telephone Encounter (Signed)
(  continued)  Pt believes it is the pressure on her shoulder & chest causing her to wake up suddenly gasping for air w/ her lung.  Again, pt states that the cpap is working.  Pt states she more waking up moments than usual.  Satira Anis

## 2014-04-08 NOTE — Telephone Encounter (Signed)
Would it help to sleep more propped up?

## 2014-04-08 NOTE — Telephone Encounter (Signed)
Pt called to check in with [place] to let us know how she is doing. PCP has referral out to see Ortho MD. Pt would like to know if CY has any other suggestions in hte meantime.   CY, Please advise. Thanks.

## 2014-04-09 ENCOUNTER — Telehealth: Payer: Self-pay | Admitting: Internal Medicine

## 2014-04-09 NOTE — Telephone Encounter (Signed)
Pt called back with another update on cpap.  States she is still tolerating machine ok but did wake up with a coughing spell at 2:30 last night but was able to go right back to sleep.  States that cough happens sometimes when lying on back.  Also slept propped on 3 pillows.  Pt still tolerating Breo well.  Please advise if any other recommendations.

## 2014-04-09 NOTE — Telephone Encounter (Signed)
No other recommendations at this time. Sounds as if she is off to a good start.

## 2014-04-09 NOTE — Telephone Encounter (Signed)
Spoke with pt and advised of Dr Janee Morn recommendations.  PT verbalized understanding.

## 2014-04-10 ENCOUNTER — Telehealth: Payer: Self-pay | Admitting: Internal Medicine

## 2014-04-10 NOTE — Telephone Encounter (Signed)
Per 04/07/14: Patient Instructions      Order- DME [facility] CPAP 8-15 cwp      Dx OSA Order- Office Spirometry    Dx asthma, mild intermittent Sample Breo ellipta inhaler- 1 puff then rinse mouth well, one time daily  Try this instead of your other inhalers. See if you feel you are breathing better.     Called spoke with pt. She wants to know if she can use her albuterol nebulizer since she is wheezing slightly.  She reports she is coughing up clear phlem (rapid spells), chest tx, increase SOB at times. Pt has tess pearls on hand if needed Please advise CDY thanks  Allergies  Allergen Reactions  . Advair Diskus [Fluticasone-Salmeterol] Other (See Comments)    Blood in urine  . Jasmine Fragrance Anaphylaxis  . Diclofenac Other (See Comments)    unknown  . Eggs Or Egg-Derived Products Other (See Comments)    "Nose runs"  . Lactase Other (See Comments)    unknown  . Molds & Smuts Other (See Comments)    unknown  . Pravastatin Other (See Comments)    unknown  . Latex Rash  . Pork-Derived Products Rash  . Sulfa Antibiotics Rash     Current Outpatient Prescriptions on File Prior to Visit  Medication Sig Dispense Refill  . albuterol (PROVENTIL HFA;VENTOLIN HFA) 108 (90 BASE) MCG/ACT inhaler Inhale 2 puffs into the lungs every 4 (four) hours as needed for wheezing.  1 Inhaler  0  . albuterol (PROVENTIL) (2.5 MG/3ML) 0.083% nebulizer solution Take 2.5 mg by nebulization every 6 (six) hours as needed for wheezing.      . calcium elemental as carbonate (BARIATRIC TUMS ULTRA) 400 MG tablet Chew 1,000 mg by mouth at bedtime as needed (GERD).      Marland Kitchen desloratadine (CLARINEX) 5 MG tablet Take 5 mg by mouth daily as needed (allergies).      . diphenhydrAMINE (BENADRYL) 25 MG tablet Take 50 mg by mouth every 8 (eight) hours as needed for allergies.       Marland Kitchen escitalopram (LEXAPRO) 10 MG tablet Take 1 tablet (10 mg total) by mouth daily.  30 tablet  2  . fluticasone (FLOVENT HFA) 44 MCG/ACT  inhaler Inhale 2 puffs into the lungs 2 (two) times daily.       . Fluticasone Furoate-Vilanterol (BREO ELLIPTA) 100-25 MCG/INH AEPB Inhale 1 puff into the lungs daily.  1 each  0  . HYDROcodone-acetaminophen (NORCO/VICODIN) 5-325 MG per tablet Take 1 tablet by mouth every 4 (four) hours as needed.  15 tablet  0  . levothyroxine (SYNTHROID, LEVOTHROID) 100 MCG tablet Take 100 mcg by mouth every morning.       Marland Kitchen lisinopril (PRINIVIL,ZESTRIL) 10 MG tablet Take 10 mg by mouth every morning.      . Melatonin 10 MG TABS Take 1 tablet by mouth at bedtime.      . naproxen (NAPROSYN) 500 MG tablet Take 500 mg by mouth 2 (two) times daily as needed (pain).        No current facility-administered medications on file prior to visit.

## 2014-04-10 NOTE — Telephone Encounter (Signed)
Called spoke with pt. Aware of recs.  Nothing further needed 

## 2014-04-10 NOTE — Telephone Encounter (Signed)
Encourage use of rescue inhaler- 2 puffs up to every 6 hours, if needed for wheezing

## 2014-04-13 DIAGNOSIS — R0600 Dyspnea, unspecified: Secondary | ICD-10-CM | POA: Insufficient documentation

## 2014-04-13 NOTE — Assessment & Plan Note (Addendum)
Dyspnea with exertion and episodes of waking short of breath despite CPAP. Normal office spirometry.possibly related to her history of mild intermittent asthma. Plan- try sample [facility]

## 2014-04-13 NOTE — Assessment & Plan Note (Addendum)
Needs to work some more with her home care company on mask comfort, but compliance and control seem good. We may decide to autotitrate for pressure evaluation

## 2014-04-13 NOTE — Assessment & Plan Note (Signed)
Watery eyes and nose when she goes outside, because of which she likes to wear a mask outdoors. May be worse in the fall but poorly defined. Plan-antihistamine

## 2014-04-14 ENCOUNTER — Telehealth: Payer: Self-pay | Admitting: Internal Medicine

## 2014-04-14 NOTE — Telephone Encounter (Signed)
lmomtcb x1 

## 2014-04-14 NOTE — Telephone Encounter (Signed)
Suggest she stop the [facility] and watch for now, with nothing new to replace it. We can discuss at next ov

## 2014-04-14 NOTE — Telephone Encounter (Signed)
Called and spoke with pt and she stated that she was seen by CY on 04/07/2014 for consult.  She stated that she was started on Breo and she has been having an allergic reaction. She stated that she was having bleeding from her belly  Button, abd swelling, gerd was acting up.  She started on the yogurt diet and has been using the nebulizer.  She has the CPAP under control at this time.  She has appt with her PCP on Friday but was advised to call CY to ask about the breo.  Pt stated that she is out of town at the moment and will have to find out what pharmacy to use if something else if this needs to be called in.  CY please advise. Thanks  Last ov--04/07/2014 Next ov--06/08/2014  Allergies  Allergen Reactions  . Advair Diskus [Fluticasone-Salmeterol] Other (See Comments)    Blood in urine  . Jasmine Fragrance Anaphylaxis  . Diclofenac Other (See Comments)    unknown  . Eggs Or Egg-Derived Products Other (See Comments)    "Nose runs"  . Lactase Other (See Comments)    unknown  . Molds & Smuts Other (See Comments)    unknown  . Pravastatin Other (See Comments)    unknown  . Latex Rash  . Pork-Derived Products Rash  . Sulfa Antibiotics Rash   Current Outpatient Prescriptions on File Prior to Visit  Medication Sig Dispense Refill  . albuterol (PROVENTIL HFA;VENTOLIN HFA) 108 (90 BASE) MCG/ACT inhaler Inhale 2 puffs into the lungs every 4 (four) hours as needed for wheezing.  1 Inhaler  0  . albuterol (PROVENTIL) (2.5 MG/3ML) 0.083% nebulizer solution Take 2.5 mg by nebulization every 6 (six) hours as needed for wheezing.      . calcium elemental as carbonate (BARIATRIC TUMS ULTRA) 400 MG tablet Chew 1,000 mg by mouth at bedtime as needed (GERD).      Marland Kitchen desloratadine (CLARINEX) 5 MG tablet Take 5 mg by mouth daily as needed (allergies).      . diphenhydrAMINE (BENADRYL) 25 MG tablet Take 50 mg by mouth every 8 (eight) hours as needed for allergies.       Marland Kitchen escitalopram (LEXAPRO) 10 MG tablet Take  1 tablet (10 mg total) by mouth daily.  30 tablet  2  . fluticasone (FLOVENT HFA) 44 MCG/ACT inhaler Inhale 2 puffs into the lungs 2 (two) times daily.       . Fluticasone Furoate-Vilanterol (BREO ELLIPTA) 100-25 MCG/INH AEPB Inhale 1 puff into the lungs daily.  1 each  0  . HYDROcodone-acetaminophen (NORCO/VICODIN) 5-325 MG per tablet Take 1 tablet by mouth every 4 (four) hours as needed.  15 tablet  0  . levothyroxine (SYNTHROID, LEVOTHROID) 100 MCG tablet Take 100 mcg by mouth every morning.       Marland Kitchen lisinopril (PRINIVIL,ZESTRIL) 10 MG tablet Take 10 mg by mouth every morning.      . Melatonin 10 MG TABS Take 1 tablet by mouth at bedtime.      . naproxen (NAPROSYN) 500 MG tablet Take 500 mg by mouth 2 (two) times daily as needed (pain).        No current facility-administered medications on file prior to visit.

## 2014-04-15 NOTE — Telephone Encounter (Signed)
Spoke with patient- she states she stopped the [place] on 04-10-14; pt states she is feeling better symptoms wise since stopping it and has been eating yogurt. Pt states her esophagus issues have left her breathless and has used her neb treatments to help with this. Pt will keep her OV in November 2015 with CY. Nothing more needed at this time.

## 2014-04-15 NOTE — Telephone Encounter (Signed)
LMTCB x2  

## 2014-04-15 NOTE — Telephone Encounter (Signed)
Pt returned call. [phone]

## 2014-04-16 ENCOUNTER — Ambulatory Visit (INDEPENDENT_AMBULATORY_CARE_PROVIDER_SITE_OTHER): Payer: MEDICAID | Admitting: Psychology

## 2014-04-16 ENCOUNTER — Encounter (HOSPITAL_COMMUNITY): Payer: Self-pay | Admitting: Psychology

## 2014-04-16 DIAGNOSIS — F329 Major depressive disorder, single episode, unspecified: Secondary | ICD-10-CM

## 2014-04-16 DIAGNOSIS — F4312 Post-traumatic stress disorder, chronic: Secondary | ICD-10-CM

## 2014-04-16 DIAGNOSIS — F411 Generalized anxiety disorder: Secondary | ICD-10-CM

## 2014-04-16 DIAGNOSIS — F4310 Post-traumatic stress disorder, unspecified: Secondary | ICD-10-CM

## 2014-04-16 DIAGNOSIS — F3289 Other specified depressive episodes: Secondary | ICD-10-CM
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
## 2014-04-17 ENCOUNTER — Encounter (HOSPITAL_COMMUNITY): Payer: Self-pay | Admitting: Psychiatry

## 2014-04-17 ENCOUNTER — Ambulatory Visit (INDEPENDENT_AMBULATORY_CARE_PROVIDER_SITE_OTHER): Payer: MEDICAID | Admitting: Psychiatry

## 2014-04-17 VITALS — BP 148/55 | HR 55 | Ht 65.0 in | Wt 325.8 lb

## 2014-04-17 DIAGNOSIS — F4310 Post-traumatic stress disorder, unspecified: Secondary | ICD-10-CM

## 2014-04-17 DIAGNOSIS — F329 Major depressive disorder, single episode, unspecified: Secondary | ICD-10-CM

## 2014-04-17 DIAGNOSIS — F3289 Other specified depressive episodes: Secondary | ICD-10-CM

## 2014-04-17 MED ORDER — ESCITALOPRAM OXALATE 10 MG PO TABS: 10.0000 mg | ORAL_TABLET | Freq: Every day | ORAL | Status: DC
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
## 2014-04-27 ENCOUNTER — Telehealth (HOSPITAL_COMMUNITY): Payer: Self-pay | Admitting: *Deleted

## 2014-04-27 NOTE — Telephone Encounter (Signed)
Tell her to stop it

## 2014-04-28 NOTE — Telephone Encounter (Signed)
Pt is aware of Dr. Harrington Challenger advise and shows understanding.

## 2014-05-07 ENCOUNTER — Ambulatory Visit (INDEPENDENT_AMBULATORY_CARE_PROVIDER_SITE_OTHER): Payer: Medicaid Other | Admitting: Orthopedic Surgery

## 2014-05-07 ENCOUNTER — Ambulatory Visit (INDEPENDENT_AMBULATORY_CARE_PROVIDER_SITE_OTHER): Payer: Medicaid Other

## 2014-05-07 VITALS — BP 108/55 | Ht 65.0 in | Wt 325.0 lb

## 2014-05-07 DIAGNOSIS — M25511 Pain in right shoulder: Secondary | ICD-10-CM

## 2014-05-07 DIAGNOSIS — G540 Brachial plexus disorders: Secondary | ICD-10-CM | POA: Insufficient documentation

## 2014-05-07 NOTE — Progress Notes (Signed)
Chief Complaint  Patient presents with  . Shoulder Pain    Right shoulder pain, injured 2 years ago. Referred by Dr. Izola Price.    58 year-old female fell at work about 2-1/2 or 3 years ago injured her right elbow and right shoulder but was mainly treated for her right elbow problems seeing Dr. Artis Flock as well as Dr. Fredna Dow. She eventually had an MRI of her cervical spine for specific and persistent radicular symptoms which was read as a foraminal disc protrusion on the left and it was not surgical. She had nerve conduction studies done. It was not thought at that time by the hand specialist that she had any significant carpal tunnel syndrome. She's been wandering around the last 2 years with radiating pain in her right shoulder and arm associated with numbness and tingling burning sensation and weakness. Her pain became 10 out of 10. She has the unusual sensation of when she lies flat she passes out. She says evaluation to have indicated that her esophagus his her trachea and is contributory to this. She has a significant amount of pain out of proportion to her x-ray findings in the upper arm shoulder neck area despite the MRI mentioned.  I saw her 6 or 8 months ago and told her that she needed a subspecialist to continue with all of her problems and her workers compensation. She was unable to go because she she did not have insurance but she was in the [employer] plan for free care.  She now has Medicaid  Her allergies include penicillin sulfa latex and certain inhalers  She has a family history of lung disease asthma COPD GERD heart disease hypertension heart attack stroke depression cancer arthritis fracture history and thyroid disease  She does not smoke or drink. No drug use history. Her review of systems  Include night sweats fatigue sore throat dental problem shortness of breath cough breathing wheezing ankle leg swelling abdominal pain nausea cold and heat intolerance depression  anxiety numbness burning pain in the right arm hayfever seasonal allergies food allergy rashes on the skin with redness joint and limb pain gait problems stiff joints and back pain  Her medical history she has asthma and COPD GERD and reflux hypertension depression arthritis fractures thyroid disease  She did not list any surgeries    Medication List       This list is accurate as of: 05/07/14 10:59 AM.  Always use your most recent med list.               albuterol (2.5 MG/3ML) 0.083% nebulizer solution  Commonly known as:  PROVENTIL  Take 2.5 mg by nebulization every 6 (six) hours as needed for wheezing.     albuterol 108 (90 BASE) MCG/ACT inhaler  Commonly known as:  PROVENTIL HFA;VENTOLIN HFA  Inhale 2 puffs into the lungs every 4 (four) hours as needed for wheezing.     calcium elemental as carbonate 400 MG tablet  Commonly known as:  BARIATRIC TUMS ULTRA  Chew 1,000 mg by mouth at bedtime as needed (GERD).     desloratadine 5 MG tablet  Commonly known as:  CLARINEX  Take 5 mg by mouth daily as needed (allergies).     diphenhydrAMINE 25 MG tablet  Commonly known as:  BENADRYL  Take 50 mg by mouth every 8 (eight) hours as needed for allergies.     escitalopram 10 MG tablet  Commonly known as:  LEXAPRO  Take 1 tablet (10  mg total) by mouth daily.     fluticasone 44 MCG/ACT inhaler  Commonly known as:  FLOVENT HFA  Inhale 2 puffs into the lungs 2 (two) times daily.     HYDROcodone-acetaminophen 5-325 MG per tablet  Commonly known as:  NORCO/VICODIN  Take 1 tablet by mouth every 4 (four) hours as needed.     levothyroxine 100 MCG tablet  Commonly known as:  SYNTHROID, LEVOTHROID  Take 100 mcg by mouth every morning.     lisinopril 10 MG tablet  Commonly known as:  PRINIVIL,ZESTRIL  Take 10 mg by mouth every morning.     Melatonin 10 MG Tabs  Take 1 tablet by mouth at bedtime.        BP 108/55  Ht 5\' 5"  (1.651 m)  Wt 325 lb (147.419 kg)  BMI 54.08  kg/m2 General appearance other than her obesity her appearance is normal. She is oriented times person place and time Her affect is flat her mood is pleasant despite all of the difficulty she's experienced including homelessness twice in the last year or so. Her ambulatory status is normal without assistive device. Lower extremities have no major abnormalities in terms of alignment motion instability weakness atrophy skin changes.  Her left arm has full range of motion stability and strength alignment and skin pulse normal temperature normal no edema no swelling normal sensation negative lymph nodes no pathologic reflexes and normal coordination of the limb with alternating and rapid movements  Cervical spine is tender on the right side including the scapula paracervical muscles she has mild rotational stiffness but it is not significant normal flexion-extension Spurling sign seems normal.  Right shoulder range of motion is full strength is normal stability is normal skin is normal no tenderness except in the scapular region again skin is intact pulses are normal she has no lymphadenopathy sensation is normal she has no pathologic reflexes and normal coordination  Her x-ray shows bony sclerosis in the inferior surface of the acromion and also on the greater tuberosity  Her impingement sign was mildly positive but she had excellent strength in the cuff again.  I think she will need to be worked up for thoracic outlet syndrome she has some type of brachial plexus or cervical/thoracic impingement type problem going on

## 2014-05-07 NOTE — Patient Instructions (Signed)
Referral to [facility] Thoracic Outlet Syndrome

## 2014-05-08 ENCOUNTER — Telehealth: Payer: Self-pay | Admitting: Internal Medicine

## 2014-05-08 NOTE — Telephone Encounter (Signed)
I hope the doctors at [facility] can help her orthopedic problems. The prescription I wrote for her CPAP mask allows "mask of choice", so she can ask help from her DME company to get help with her CPAP mask fit.

## 2014-05-08 NOTE — Telephone Encounter (Signed)
Called and spoke to pt. Pt wanted to give CY an FYI on her recent referral to ortho and the recent diagnosis of, thoracic outlet syndrome. Pt is now being referred to [facility] d/t the recent dx.   Pt also has an issue with her CPAP mask. Pt states when she falls asleep her mask will leak air and make noises waking her back up. Pt questioning if she needs a smaller mask. Pt stated she slept very well at the sleep center with the mask they used. Pt requesting an recommendations by CY.  CY please advise.

## 2014-05-08 NOTE — Telephone Encounter (Signed)
Pt aware of rec's per CDY. Nothing further needed. 

## 2014-05-11 ENCOUNTER — Ambulatory Visit (HOSPITAL_COMMUNITY): Payer: Self-pay | Admitting: Psychiatry

## 2014-05-15 ENCOUNTER — Encounter (HOSPITAL_COMMUNITY): Payer: Self-pay | Admitting: Psychiatry

## 2014-05-15 ENCOUNTER — Ambulatory Visit (INDEPENDENT_AMBULATORY_CARE_PROVIDER_SITE_OTHER): Payer: MEDICAID | Admitting: Psychiatry

## 2014-05-15 VITALS — Ht 65.0 in | Wt 321.0 lb

## 2014-05-15 DIAGNOSIS — F4312 Post-traumatic stress disorder, chronic: Secondary | ICD-10-CM
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
## 2014-05-18 ENCOUNTER — Ambulatory Visit (INDEPENDENT_AMBULATORY_CARE_PROVIDER_SITE_OTHER): Payer: MEDICAID | Admitting: Psychology

## 2014-05-18 DIAGNOSIS — F4312 Post-traumatic stress disorder, chronic: Secondary | ICD-10-CM

## 2014-05-18 DIAGNOSIS — F4310 Post-traumatic stress disorder, unspecified: Secondary | ICD-10-CM
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
## 2014-06-08 ENCOUNTER — Encounter: Payer: Self-pay | Admitting: Internal Medicine

## 2014-06-08 ENCOUNTER — Ambulatory Visit (INDEPENDENT_AMBULATORY_CARE_PROVIDER_SITE_OTHER): Payer: Medicare Other | Admitting: Internal Medicine

## 2014-06-08 VITALS — BP 114/68 | HR 64 | Ht 65.0 in | Wt 324.8 lb

## 2014-06-08 DIAGNOSIS — J4520 Mild intermittent asthma, uncomplicated: Secondary | ICD-10-CM

## 2014-06-08 DIAGNOSIS — G4733 Obstructive sleep apnea (adult) (pediatric): Secondary | ICD-10-CM | POA: Diagnosis not present

## 2014-06-08 NOTE — Patient Instructions (Signed)
We can continue CPAP 11/ Laynes  Ok to use your nebulizer as needed, up to 4 times daily  Ok not to use the Flovent, since it causes thrush so easily for you, and I don't think you need it now.   Please call as needed
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
## 2014-06-09 ENCOUNTER — Telehealth: Payer: Self-pay | Admitting: Internal Medicine

## 2014-06-09 DIAGNOSIS — G4733 Obstructive sleep apnea (adult) (pediatric): Secondary | ICD-10-CM

## 2014-06-09 NOTE — Telephone Encounter (Signed)
Called and spoke with pt. She needs [place] to fax laynes the CPAP order to change CPAP to 11. No referral was placed. I have done so. Nothing further needed

## 2014-06-17 ENCOUNTER — Ambulatory Visit (INDEPENDENT_AMBULATORY_CARE_PROVIDER_SITE_OTHER): Payer: MEDICAID | Admitting: Psychology

## 2014-06-17 ENCOUNTER — Encounter (HOSPITAL_COMMUNITY): Payer: Self-pay | Admitting: Psychology

## 2014-06-17 DIAGNOSIS — F4312 Post-traumatic stress disorder, chronic: Secondary | ICD-10-CM

## 2014-06-17 DIAGNOSIS — F5105 Insomnia due to other mental disorder: Secondary | ICD-10-CM

## 2014-06-17 DIAGNOSIS — F341 Dysthymic disorder: Secondary | ICD-10-CM

## 2014-06-17 DIAGNOSIS — F418 Other specified anxiety disorders: Secondary | ICD-10-CM
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
## 2014-06-18 ENCOUNTER — Ambulatory Visit (HOSPITAL_COMMUNITY): Payer: Self-pay | Admitting: Psychology

## 2014-06-25 ENCOUNTER — Telehealth: Payer: Self-pay | Admitting: Internal Medicine

## 2014-06-25 NOTE — Telephone Encounter (Signed)
Nothing we need to do. Hope her doctor visit goes well.

## 2014-06-25 NOTE — Telephone Encounter (Signed)
Spoke with the pt  She states that she woke up suddenly last night, and had hallucination that there was a spider inside of her CPAP mask  She states that she fell back asleep and this happened again She states "I think it's just from stress" She is concerned b/c she is seeing specialist tomorrow for thoracic outlet syndrome and "wonder if this is connected to my sleep" CDY, please advise thanks!

## 2014-06-26 NOTE — Telephone Encounter (Signed)
Spoke with pt and advised of Dr Janee Morn recommendations.

## 2014-06-26 NOTE — Telephone Encounter (Signed)
lmtcb for pt.  

## 2014-07-01 ENCOUNTER — Telehealth (HOSPITAL_COMMUNITY): Payer: Self-pay | Admitting: *Deleted

## 2014-07-01 ENCOUNTER — Other Ambulatory Visit (HOSPITAL_COMMUNITY): Payer: Self-pay | Admitting: Psychiatry

## 2014-07-01 MED ORDER — FLUOXETINE HCL 10 MG PO CAPS: 10.0000 mg | ORAL_CAPSULE | Freq: Every day | ORAL | Status: DC

## 2014-07-01 NOTE — Telephone Encounter (Signed)
Pt calling wanting to let Dr. Harrington Challenger to know that this last week her PTSD started and she had hallucinations while on CPAP and she wen to her PCP. Pt would like to know if Dr. Harrington Challenger could send in 10 mg Prozac to her pharmacy. Per pt, she stopped taking her 20 mg Prozac due to it being too strong. Pt number is [phone].

## 2014-07-01 NOTE — Telephone Encounter (Signed)
Prescription sent

## 2014-07-06 NOTE — Telephone Encounter (Signed)
Pt is aware.  

## 2014-07-13 ENCOUNTER — Telehealth: Payer: Self-pay

## 2014-07-13 NOTE — Telephone Encounter (Signed)
Pt called saying that [facility] had sent a referral to [place] and she hasn't heard from [place]. I told her that DS sent her a letter on NOV 12 and verified the address. Patient said that was correct, but she still didn't receive a letter. Please call her at [phone]

## 2014-07-15 ENCOUNTER — Telehealth (HOSPITAL_COMMUNITY): Payer: Self-pay | Admitting: *Deleted

## 2014-07-16 ENCOUNTER — Ambulatory Visit (HOSPITAL_COMMUNITY): Payer: Self-pay | Admitting: Psychology

## 2014-07-21 NOTE — Telephone Encounter (Addendum)
Gastroenterology Pre-Procedure Review  Request Date: Requesting Physician: Izola Price  PATIENT REVIEW QUESTIONS: The patient responded to the following health history questions as indicated:    1. Diabetes Melitis: NO 2. Joint replacements in the past 12 months: NO 3. Major health problems in the past 3 months: NO 4. Has an artificial valve or MVP: NO 5. Has a defibrillator: NO 6. Has been advised in past to take antibiotics in advance of a procedure like teeth cleaning: ON 7. Family History of Colon Cancer: NO 8. Alcohol : 6 oz in kool-aid twice a month 9. No problems at this time     MEDICATIONS & ALLERGIES:    Patient reports the following regarding taking any blood thinners:   Plavix? NO Aspirin? YES Coumadin? NO  Patient confirms/reports the following medications:  Current Outpatient Prescriptions  Medication Sig Dispense Refill  . albuterol (PROVENTIL HFA;VENTOLIN HFA) 108 (90 BASE) MCG/ACT inhaler Inhale 2 puffs into the lungs every 4 (four) hours as needed for wheezing. 1 Inhaler 0  . albuterol (PROVENTIL) (2.5 MG/3ML) 0.083% nebulizer solution Take 2.5 mg by nebulization every 6 (six) hours as needed for wheezing.    . diphenhydrAMINE (BENADRYL) 25 MG tablet Take 50 mg by mouth every 8 (eight) hours as needed for allergies.     Marland Kitchen gabapentin (NEURONTIN) 100 MG capsule Take 100 mg by mouth at bedtime.    Marland Kitchen levothyroxine (SYNTHROID, LEVOTHROID) 100 MCG tablet Take 100 mcg by mouth every morning.     . Melatonin 10 MG TABS Take 1 tablet by mouth at bedtime.    . naproxen (NAPROSYN) 500 MG tablet Take 500 mg by mouth 2 (two) times daily with a meal.     No current facility-administered medications for this visit.    Patient confirms/reports the following allergies:  Allergies  Allergen Reactions  . Advair Diskus [Fluticasone-Salmeterol] Other (See Comments)    Blood in urine  . Jasmine Fragrance Anaphylaxis  . Diclofenac Other (See Comments)    unknown  . Eggs Or  Egg-Derived Products Other (See Comments)    "Nose runs"  . Lactase Other (See Comments)    unknown  . Molds & Smuts Other (See Comments)    unknown  . Pravastatin Other (See Comments)    unknown  . Latex Rash  . Pork-Derived Products Rash  . Sulfa Antibiotics Rash    No orders of the defined types were placed in this encounter.    AUTHORIZATION INFORMATION Primary Insurance: Medicare,  ID #: [id number]  Group #:  Pre-Cert / Auth required: Pre-Cert / Auth #:   Secondary Insurance: Medicaid  ID #: [id number]  Group #:  Pre-Cert / Josem Kaufmann required: Pre-Cert / Auth #:   SCHEDULE INFORMATION: Procedure has been scheduled as follows:  Date: ,Time:  Location:   This Gastroenterology Pre-Precedure Review Form is being routed to the following provider(s):   Does not matter who TCS

## 2014-07-27 ENCOUNTER — Other Ambulatory Visit: Payer: Self-pay

## 2014-07-27 DIAGNOSIS — Z1211 Encounter for screening for malignant neoplasm of colon: Secondary | ICD-10-CM

## 2014-07-27 MED ORDER — PEG-KCL-NACL-NASULF-NA ASC-C 100 G PO SOLR: 1.0000 | ORAL | Status: DC

## 2014-07-27 NOTE — Addendum Note (Signed)
Addended by: Mahala Menghini on: 07/27/2014 10:08 AM   Modules accepted: Orders, Medications

## 2014-07-27 NOTE — Telephone Encounter (Signed)
Pt is set up for TCS on 08/12/14 with RMR @ 1:15. Pt is aware and instruction are in the mail.

## 2014-07-27 NOTE — Telephone Encounter (Signed)
OK to schedule

## 2014-07-28 ENCOUNTER — Telehealth: Payer: Self-pay

## 2014-07-28 NOTE — Telephone Encounter (Signed)
Noted. Patient will be monitored closely during procedure. Will make Dr. Gala Romney aware.

## 2014-07-28 NOTE — Telephone Encounter (Signed)
Pt is aware.  

## 2014-07-28 NOTE — Telephone Encounter (Signed)
Noted. She should report that history again on the day of her procedure.

## 2014-07-28 NOTE — Telephone Encounter (Signed)
Is calling because she forgot to let us know that when she had a tooth pulled with sedation. Her breathing slowed down a lot and they had to shake her to wake her up. She is wanting to make sure that we was aware of this before her TCS. Please advise

## 2014-08-12 ENCOUNTER — Ambulatory Visit (HOSPITAL_COMMUNITY)
Admission: RE | Admit: 2014-08-12 | Discharge: 2014-08-12 | Disposition: A | Discharge status: Home / Self Care | Payer: Medicare Other | Source: Ambulatory Visit | Attending: Internal Medicine | Admitting: Internal Medicine

## 2014-08-12 ENCOUNTER — Encounter (HOSPITAL_COMMUNITY)
Admission: RE | Disposition: A | Discharge status: Home / Self Care | Payer: Self-pay | Source: Ambulatory Visit | Attending: Internal Medicine

## 2014-08-12 DIAGNOSIS — Z860100 Personal history of colon polyps, unspecified: Secondary | ICD-10-CM | POA: Insufficient documentation

## 2014-08-12 DIAGNOSIS — G4700 Insomnia, unspecified: Secondary | ICD-10-CM | POA: Diagnosis not present

## 2014-08-12 DIAGNOSIS — Z1211 Encounter for screening for malignant neoplasm of colon: Secondary | ICD-10-CM | POA: Diagnosis present

## 2014-08-12 DIAGNOSIS — K635 Polyp of colon: Secondary | ICD-10-CM

## 2014-08-12 DIAGNOSIS — M1610 Unilateral primary osteoarthritis, unspecified hip: Secondary | ICD-10-CM | POA: Insufficient documentation

## 2014-08-12 DIAGNOSIS — F4310 Post-traumatic stress disorder, unspecified: Secondary | ICD-10-CM | POA: Insufficient documentation

## 2014-08-12 DIAGNOSIS — J45909 Unspecified asthma, uncomplicated: Secondary | ICD-10-CM | POA: Diagnosis not present

## 2014-08-12 DIAGNOSIS — K5730 Diverticulosis of large intestine without perforation or abscess without bleeding: Secondary | ICD-10-CM | POA: Insufficient documentation

## 2014-08-12 DIAGNOSIS — J449 Chronic obstructive pulmonary disease, unspecified: Secondary | ICD-10-CM | POA: Insufficient documentation

## 2014-08-12 DIAGNOSIS — E079 Disorder of thyroid, unspecified: Secondary | ICD-10-CM | POA: Diagnosis not present

## 2014-08-12 DIAGNOSIS — Z79899 Other long term (current) drug therapy: Secondary | ICD-10-CM | POA: Insufficient documentation

## 2014-08-12 DIAGNOSIS — Z818 Family history of other mental and behavioral disorders: Secondary | ICD-10-CM | POA: Diagnosis not present

## 2014-08-12 DIAGNOSIS — D123 Benign neoplasm of transverse colon: Secondary | ICD-10-CM | POA: Diagnosis not present

## 2014-08-12 HISTORY — PX: COLONOSCOPY: SHX5424

## 2014-08-12 SURGERY — COLONOSCOPY
Anesthesia: Moderate Sedation

## 2014-08-12 MED ORDER — SODIUM CHLORIDE 0.9 % IV SOLN
INTRAVENOUS | Status: DC
  Administered 2014-08-12: 1000 mL via INTRAVENOUS

## 2014-08-12 MED ORDER — MEPERIDINE HCL 100 MG/ML IJ SOLN
INTRAMUSCULAR | Status: DC
Start: 2014-08-12 — End: 2014-08-12
  Filled 2014-08-12: qty 2

## 2014-08-12 MED ORDER — SIMETHICONE 40 MG/0.6ML PO SUSP
ORAL | Status: DC | PRN
  Administered 2014-08-12: 14:00:00

## 2014-08-12 MED ORDER — ONDANSETRON HCL 4 MG/2ML IJ SOLN
INTRAMUSCULAR | Status: DC | PRN
  Administered 2014-08-12: 4 mg via INTRAVENOUS

## 2014-08-12 MED ORDER — MEPERIDINE HCL 100 MG/ML IJ SOLN
INTRAMUSCULAR | Status: DC | PRN
  Administered 2014-08-12: 25 mg via INTRAVENOUS
  Administered 2014-08-12: 50 mg via INTRAVENOUS
  Administered 2014-08-12: 25 mg via INTRAVENOUS

## 2014-08-12 MED ORDER — MIDAZOLAM HCL 5 MG/5ML IJ SOLN
INTRAMUSCULAR | Status: DC | PRN
  Administered 2014-08-12: 2 mg via INTRAVENOUS
  Administered 2014-08-12: 1 mg via INTRAVENOUS
  Administered 2014-08-12: 2 mg via INTRAVENOUS
  Administered 2014-08-12 (×3): 1 mg via INTRAVENOUS

## 2014-08-12 MED ORDER — MIDAZOLAM HCL 5 MG/5ML IJ SOLN
INTRAMUSCULAR | Status: AC
  Filled 2014-08-12: qty 10

## 2014-08-12 MED ORDER — ONDANSETRON HCL 4 MG/2ML IJ SOLN
INTRAMUSCULAR | Status: AC
  Filled 2014-08-12: qty 2

## 2014-08-12 NOTE — H&P (Signed)
@LOGO@   Primary Care Physician:  ROBERSON, KRISTINA, MD Primary Gastroenterologist:  Dr.   Pre-Procedure History & Physical: HPI:  Catherine Turner is a 58 y.o. female is here for a screening colonoscopy.  First-ever screening examination. No bowel symptoms. No family history of colorectal cancer.  Past Medical History  Diagnosis Date  . Asthma   . COPD (chronic obstructive pulmonary disease)   . Arm fracture     right arm   . DJD (degenerative joint disease) of hip   . Thyroid disease   . Hip pain   . PTSD (post-traumatic stress disorder)   . Insomnia     No past surgical history on file.  Prior to Admission medications   Medication Sig Start Date End Date Taking? Authorizing Provider  Cholecalciferol (VITAMIN D PO) Take 1 tablet by mouth daily.   Yes Historical Provider, MD  gabapentin (NEURONTIN) 100 MG capsule Take 100 mg by mouth at bedtime.   Yes Historical Provider, MD  Homeopathic Products (T-RELIEF ARTHRITIS MOBILITY) TABS Take 1 tablet by mouth 2 (two) times daily.   Yes Historical Provider, MD  levothyroxine (SYNTHROID, LEVOTHROID) 100 MCG tablet Take 100 mcg by mouth every morning.    Yes Historical Provider, MD  Melatonin 10 MG TABS Take 1 tablet by mouth at bedtime.   Yes Historical Provider, MD  naproxen (NAPROSYN) 500 MG tablet Take 500 mg by mouth 2 (two) times daily as needed for mild pain.    Yes Historical Provider, MD  omega-3 acid ethyl esters (LOVAZA) 1 G capsule Take 1 g by mouth daily.   Yes Historical Provider, MD  peg 3350 powder (MOVIPREP) 100 G SOLR Take 1 kit (200 g total) by mouth as directed. 07/27/14  Yes  M , MD  albuterol (PROVENTIL HFA;VENTOLIN HFA) 108 (90 BASE) MCG/ACT inhaler Inhale 2 puffs into the lungs every 4 (four) hours as needed for wheezing. 10/01/12   Stephen Rancour, MD  albuterol (PROVENTIL) (2.5 MG/3ML) 0.083% nebulizer solution Take 2.5 mg by nebulization every 6 (six) hours as needed for wheezing. 04/12/12    Kathleen McManus, DO  benzonatate (TESSALON) 100 MG capsule Take 100 mg by mouth daily as needed for cough.    Historical Provider, MD  diphenhydrAMINE (BENADRYL) 25 MG tablet Take 50 mg by mouth every 8 (eight) hours as needed for allergies.     Historical Provider, MD    Allergies as of 07/27/2014 - Review Complete 07/21/2014  Allergen Reaction Noted  . Advair diskus [fluticasone-salmeterol] Other (See Comments) 11/29/2012  . Jasmine fragrance Anaphylaxis 10/30/2011  . Diclofenac Other (See Comments) 01/13/2013  . Eggs or egg-derived products Other (See Comments) 03/15/2013  . Lactase Other (See Comments) 11/29/2012  . Molds & smuts Other (See Comments) 03/15/2013  . Pravastatin Other (See Comments) 11/29/2012  . Latex Rash 10/30/2011  . Pork-derived products Rash 10/30/2011  . Sulfa antibiotics Rash 10/30/2011    Family History  Problem Relation Age of Onset  . COPD Mother   . Cancer - Lung Mother   . Hypertension Father   . Drug abuse Sister   . Dementia Maternal Aunt   . Heart disease Maternal Grandfather   . Heart disease Maternal Grandmother   . Heart disease Paternal Grandfather   . Heart attack Paternal Grandfather   . Heart disease Paternal Grandmother   . ADD / ADHD Neg Hx   . Bipolar disorder Neg Hx   . OCD Neg Hx   . Paranoid behavior Neg Hx   .   Schizophrenia Neg Hx   . Seizures Neg Hx   . Sexual abuse Neg Hx   . Physical abuse Other   . Anxiety disorder Other   . Birth defects Other     Downs Synddrome  . Alcohol abuse Paternal Aunt   . Alcohol abuse Paternal Uncle     History   Social History  . Marital Status: Single    Spouse Name: N/A    Number of Children: N/A  . Years of Education: N/A   Occupational History  . Not on file.   Social History Main Topics  . Smoking status: Passive Smoke Exposure - Never Smoker  . Smokeless tobacco: Never Used  . Alcohol Use: No  . Drug Use: No  . Sexual Activity: Not on file   Other Topics Concern  .  Not on file   Social History Narrative    Review of Systems: See HPI, otherwise negative ROS  Physical Exam: BP 136/66 mmHg  Pulse 68  Temp(Src) 98 F (36.7 C) (Oral)  Resp 20  SpO2 96% General:   Alert,  Well-developed, well-nourished, pleasant and cooperative in NAD Head:  Normocephalic and atraumatic. Eyes:  Sclera clear, no icterus.   Conjunctiva pink. Ears:  Normal auditory acuity. Nose:  No deformity, discharge,  or lesions. Mouth:  No deformity or lesions, dentition normal. Neck:  Supple; no masses or thyromegaly. Lungs:  Clear throughout to auscultation.   No wheezes, crackles, or rhonchi. No acute distress. Heart:  Regular rate and rhythm; no murmurs, clicks, rubs,  or gallops. Abdomen:  Nondistended. Obese. Positive bowel sounds soft nontender without appreciable mass or organomegaly. Very small easily reducible umbilical hernia  Msk:  Symmetrical without gross deformities. Normal posture. Pulses:  Normal pulses noted. Extremities:  Without clubbing or edema. Neurologic:  Alert and  oriented x4;  grossly normal neurologically. Skin:  Intact without significant lesions or rashes. Cervical Nodes:  No significant cervical adenopathy. Psych:  Alert and cooperative. Normal mood and affect.  Impression/Plan: Catherine Turner is now here to undergo a screening colonoscopy.  First ever average risk screening examination. Risks, benefits, limitations, imponderables and alternatives regarding colonoscopy have been reviewed with the patient. Questions have been answered. All parties agreeable.     Notice:  This dictation was prepared with Dragon dictation along with smaller phrase technology. Any transcriptional errors that result from this process are unintentional and may not be corrected upon review. 

## 2014-08-12 NOTE — Discharge Instructions (Signed)
°Colonoscopy °Discharge Instructions ° °Read the instructions outlined below and refer to this sheet in the next few weeks. These discharge instructions provide you with general information on caring for yourself after you leave the hospital. Your doctor may also give you specific instructions. While your treatment has been planned according to the most current medical practices available, unavoidable complications occasionally occur. If you have any problems or questions after discharge, call Dr. Rourk at [phone]. °ACTIVITY °· You may resume your regular activity, but move at a slower pace for the next 24 hours.  °· Take frequent rest periods for the next 24 hours.  °· Walking will help get rid of the air and reduce the bloated feeling in your belly (abdomen).  °· No driving for 24 hours (because of the medicine (anesthesia) used during the test).   °· Do not sign any important legal documents or operate any machinery for 24 hours (because of the anesthesia used during the test).  °NUTRITION °· Drink plenty of fluids.  °· You may resume your normal diet as instructed by your doctor.  °· Begin with a light meal and progress to your normal diet. Heavy or fried foods are harder to digest and may make you feel sick to your stomach (nauseated).  °· Avoid alcoholic beverages for 24 hours or as instructed.  °MEDICATIONS °· You may resume your normal medications unless your doctor tells you otherwise.  °WHAT YOU CAN EXPECT TODAY °· Some feelings of bloating in the abdomen.  °· Passage of more gas than usual.  °· Spotting of blood in your stool or on the toilet paper.  °IF YOU HAD POLYPS REMOVED DURING THE COLONOSCOPY: °· No aspirin products for 7 days or as instructed.  °· No alcohol for 7 days or as instructed.  °· Eat a soft diet for the next 24 hours.  °FINDING OUT THE RESULTS OF YOUR TEST °Not all test results are available during your visit. If your test results are not back during the visit, make an appointment  with your caregiver to find out the results. Do not assume everything is normal if you have not heard from your caregiver or the medical facility. It is important for you to follow up on all of your test results.  °SEEK IMMEDIATE MEDICAL ATTENTION IF: °· You have more than a spotting of blood in your stool.  °· Your belly is swollen (abdominal distention).  °· You are nauseated or vomiting.  °· You have a temperature over 101.  °· You have abdominal pain or discomfort that is severe or gets worse throughout the day.  ° ° °Polyp and diverticulosis information provided ° °Further recommendations to follow pending review of pathology report ° °Diverticulosis °Diverticulosis is the condition that develops when small pouches (diverticula) form in the wall of your colon. Your colon, or large intestine, is where water is absorbed and stool is formed. The pouches form when the inside layer of your colon pushes through weak spots in the outer layers of your colon. °CAUSES  °No one knows exactly what causes diverticulosis. °RISK FACTORS °· Being older than 50. Your risk for this condition increases with age. Diverticulosis is rare in people younger than 40 years. By age 80, almost everyone has it. °· Eating a low-fiber diet. °· Being frequently constipated. °· Being overweight. °· Not getting enough exercise. °· Smoking. °· Taking over-the-counter pain medicines, like aspirin and ibuprofen. °SYMPTOMS  °Most people with diverticulosis do not have symptoms. °DIAGNOSIS  °Because   diverticulosis often has no symptoms, health care providers often discover the condition during an exam for other colon problems. In many cases, a health care provider will diagnose diverticulosis while using a flexible scope to examine the colon (colonoscopy). °TREATMENT  °If you have never developed an infection related to diverticulosis, you may not need treatment. If you have had an infection before, treatment may include: °· Eating more fruits,  vegetables, and grains. °· Taking a fiber supplement. °· Taking a live bacteria supplement (probiotic). °· Taking medicine to relax your colon. °HOME CARE INSTRUCTIONS  °· Drink at least 6-8 glasses of water each day to prevent constipation. °· Try not to strain when you have a bowel movement. °· Keep all follow-up appointments. °If you have had an infection before:  °· Increase the fiber in your diet as directed by your health care provider or dietitian. °· Take a dietary fiber supplement if your health care provider approves. °· Only take medicines as directed by your health care provider. °SEEK MEDICAL CARE IF:  °· You have abdominal pain. °· You have bloating. °· You have cramps. °· You have not gone to the bathroom in 3 days. °SEEK IMMEDIATE MEDICAL CARE IF:  °· Your pain gets worse. °· Your bloating becomes very bad. °· You have a fever or chills, and your symptoms suddenly get worse. °· You begin vomiting. °· You have bowel movements that are bloody or black. °MAKE SURE YOU: °· Understand these instructions. °· Will watch your condition. °· Will get help right away if you are not doing well or get worse. °Document Released: 04/20/2004 Document Revised: 07/29/2013 Document Reviewed: 06/18/2013 °ExitCare® Patient Information ©2015 [employer]. This information is not intended to replace advice given to you by your health care provider. Make sure you discuss any questions you have with your health care provider. ° ° °Colon Polyps °Polyps are lumps of extra tissue growing inside the body. Polyps can grow in the large intestine (colon). Most colon polyps are noncancerous (benign). However, some colon polyps can become cancerous over time. Polyps that are larger than a pea may be harmful. To be safe, caregivers remove and test all polyps. °CAUSES  °Polyps form when mutations in the genes cause your cells to grow and divide even though no more tissue is needed. °RISK FACTORS °There are a number of risk factors  that can increase your chances of getting colon polyps. They include: °· Being older than 50 years. °· Family history of colon polyps or colon cancer. °· Long-term colon diseases, such as colitis or Crohn disease. °· Being overweight. °· Smoking. °· Being inactive. °· Drinking too much alcohol. °SYMPTOMS  °Most small polyps do not cause symptoms. If symptoms are present, they may include: °· Blood in the stool. The stool may look dark red or black. °· Constipation or diarrhea that lasts longer than 1 week. °DIAGNOSIS °People often do not know they have polyps until their caregiver finds them during a regular checkup. Your caregiver can use 4 tests to check for polyps: °· Digital rectal exam. The caregiver wears gloves and feels inside the rectum. This test would find polyps only in the rectum. °· Barium enema. The caregiver puts a liquid called barium into your rectum before taking X-rays of your colon. Barium makes your colon look white. Polyps are dark, so they are easy to see in the X-ray pictures. °· Sigmoidoscopy. A thin, flexible tube (sigmoidoscope) is placed into your rectum. The sigmoidoscope has a light and tiny camera   in it. The caregiver uses the sigmoidoscope to look at the last third of your colon. °· Colonoscopy. This test is like sigmoidoscopy, but the caregiver looks at the entire colon. This is the most common method for finding and removing polyps. °TREATMENT  °Any polyps will be removed during a sigmoidoscopy or colonoscopy. The polyps are then tested for cancer. °PREVENTION  °To help lower your risk of getting more colon polyps: °· Eat plenty of fruits and vegetables. Avoid eating fatty foods. °· Do not smoke. °· Avoid drinking alcohol. °· Exercise every day. °· Lose weight if recommended by your caregiver. °· Eat plenty of calcium and folate. Foods that are rich in calcium include milk, cheese, and broccoli. Foods that are rich in folate include chickpeas, kidney beans, and spinach. °HOME CARE  INSTRUCTIONS °Keep all follow-up appointments as directed by your caregiver. You may need periodic exams to check for polyps. °SEEK MEDICAL CARE IF: °You notice bleeding during a bowel movement. °Document Released: 04/19/2004 Document Revised: 10/16/2011 Document Reviewed: 10/03/2011 °ExitCare® Patient Information ©2015 [employer]. This information is not intended to replace advice given to you by your health care provider. Make sure you discuss any questions you have with your health care provider. ° °

## 2014-08-12 NOTE — Op Note (Signed)
[facility] [address] [place], [address]   COLONOSCOPY PROCEDURE REPORT  PATIENT: Catherine Turner, Catherine Turner  MR#: [id number] BIRTHDATE: 1955-12-25 , 56  yrs. old GENDER: female ENDOSCOPIST: R.  Garfield Cornea, MD FACP [facility] REFERRED KD:TOIZTIW [place], [place] PROCEDURE DATE:  08-13-2014 PROCEDURE:   Colonoscopy with snare polypectomy INDICATIONS:First-ever average risk screening colonoscopy. MEDICATIONS: Versed 8 mg IV and Demerol 100 mg IV in divided doses. Zofran 4 mg IV ASA CLASS:       Class II  CONSENT: The risks, benefits, alternatives and imponderables including but not limited to bleeding, perforation as well as the possibility of a missed lesion have been reviewed.  The potential for biopsy, lesion removal, etc. have also been discussed. Questions have been answered.  All parties agreeable.  Please see the history and physical in the medical record for more information.  DESCRIPTION OF PROCEDURE:   After the risks benefits and alternatives of the procedure were thoroughly explained, informed consent was obtained.  The digital rectal exam revealed no abnormalities of the rectum.   The EC-[id number] ([id number])  endoscope was introduced through the anus and advanced to the cecum, which was identified by both the appendix and ileocecal valve. No adverse events experienced.   The quality of the prep was adequate.  The instrument was then slowly withdrawn as the colon was fully examined.      COLON FINDINGS: Normal rectum.  Somewhat redundant colon.  Scattered left-sided diverticula; (1) 4 mm polyp at the hepatic flexure; otherwise, the remainder of the colonic mucosa appeared normal.  The above mentioned polyp was cold snare removed.  It was not recovered at the time of this dictation.  From the pit pattern, however, it appeared to be a simple adenoma.  Retroflexed views revealed no abnormalities. .  Withdrawal time=7 minutes 0 seconds.  The scope was withdrawn  and the procedure completed. COMPLICATIONS: There were no immediate complications.  ENDOSCOPIC IMPRESSION: Single colonic polyp hepatic flexure?"removed but not recovered at the time of this dictation.  Colonic diverticulosis.  RECOMMENDATIONS: Further recommendations to follow pending pathology report  eSigned:  R. Garfield Cornea, MD Rosalita Chessman [facility] 08-13-2014 2:31 PM   cc:  CPT CODES: ICD CODES:  The ICD and CPT codes recommended by this software are interpretations from the data that the clinical staff has captured with the software.  The verification of the translation of this report to the ICD and CPT codes and modifiers is the sole responsibility of the health care institution and practicing physician where this report was generated.  [employer] will not be held responsible for the validity of the ICD and CPT codes included on this report.  AMA assumes no liability for data contained or not contained herein. CPT is a Designer, television/film set of the [employer].

## 2014-08-13 ENCOUNTER — Encounter (HOSPITAL_COMMUNITY): Payer: Self-pay | Admitting: Internal Medicine

## 2014-08-17 ENCOUNTER — Ambulatory Visit (HOSPITAL_COMMUNITY): Payer: Self-pay | Admitting: Psychiatry

## 2014-08-18 ENCOUNTER — Encounter: Payer: Self-pay | Admitting: Internal Medicine

## 2014-08-19 DIAGNOSIS — M25511 Pain in right shoulder: Secondary | ICD-10-CM | POA: Insufficient documentation

## 2014-08-21 ENCOUNTER — Ambulatory Visit (HOSPITAL_COMMUNITY): Payer: Self-pay | Admitting: Psychology

## 2014-09-09 DIAGNOSIS — M7551 Bursitis of right shoulder: Secondary | ICD-10-CM | POA: Insufficient documentation

## 2014-09-09 DIAGNOSIS — M7521 Bicipital tendinitis, right shoulder: Secondary | ICD-10-CM | POA: Insufficient documentation

## 2014-09-09 DIAGNOSIS — S4991XA Unspecified injury of right shoulder and upper arm, initial encounter: Secondary | ICD-10-CM | POA: Insufficient documentation

## 2014-09-21 DIAGNOSIS — J45909 Unspecified asthma, uncomplicated: Secondary | ICD-10-CM | POA: Insufficient documentation

## 2014-09-21 NOTE — Assessment & Plan Note (Signed)
She continues compliant with CPAP 11/ [facility], used all night most nights, and recognizing she sleeps better. CPAP remains medically necessary.

## 2014-11-24 ENCOUNTER — Other Ambulatory Visit (HOSPITAL_COMMUNITY): Payer: Self-pay | Admitting: Nurse Practitioner

## 2014-11-24 DIAGNOSIS — Z1231 Encounter for screening mammogram for malignant neoplasm of breast: Secondary | ICD-10-CM

## 2014-11-26 ENCOUNTER — Ambulatory Visit (HOSPITAL_COMMUNITY)
Admission: RE | Admit: 2014-11-26 | Discharge: 2014-11-26 | Disposition: A | Discharge status: Home / Self Care | Payer: Medicare Other | Source: Ambulatory Visit | Attending: Nurse Practitioner | Admitting: Nurse Practitioner

## 2014-11-26 DIAGNOSIS — Z1231 Encounter for screening mammogram for malignant neoplasm of breast: Secondary | ICD-10-CM | POA: Diagnosis not present

## 2014-11-29 ENCOUNTER — Emergency Department (HOSPITAL_COMMUNITY)
Admission: EM | Admit: 2014-11-29 | Discharge: 2014-11-29 | Disposition: A | Discharge status: Home / Self Care | Payer: Medicare Other | Attending: Emergency Medicine | Admitting: Emergency Medicine

## 2014-11-29 ENCOUNTER — Encounter (HOSPITAL_COMMUNITY): Payer: Self-pay | Admitting: Emergency Medicine

## 2014-11-29 DIAGNOSIS — M169 Osteoarthritis of hip, unspecified: Secondary | ICD-10-CM | POA: Insufficient documentation

## 2014-11-29 DIAGNOSIS — K429 Umbilical hernia without obstruction or gangrene: Secondary | ICD-10-CM | POA: Diagnosis not present

## 2014-11-29 DIAGNOSIS — R58 Hemorrhage, not elsewhere classified: Secondary | ICD-10-CM | POA: Insufficient documentation

## 2014-11-29 DIAGNOSIS — Z8781 Personal history of (healed) traumatic fracture: Secondary | ICD-10-CM | POA: Diagnosis not present

## 2014-11-29 DIAGNOSIS — Z79899 Other long term (current) drug therapy: Secondary | ICD-10-CM | POA: Diagnosis not present

## 2014-11-29 DIAGNOSIS — Z91040 Latex allergy status: Secondary | ICD-10-CM | POA: Insufficient documentation

## 2014-11-29 DIAGNOSIS — G4700 Insomnia, unspecified: Secondary | ICD-10-CM | POA: Diagnosis not present

## 2014-11-29 DIAGNOSIS — R198 Other specified symptoms and signs involving the digestive system and abdomen: Secondary | ICD-10-CM

## 2014-11-29 DIAGNOSIS — Z8659 Personal history of other mental and behavioral disorders: Secondary | ICD-10-CM | POA: Diagnosis not present

## 2014-11-29 DIAGNOSIS — J449 Chronic obstructive pulmonary disease, unspecified: Secondary | ICD-10-CM | POA: Insufficient documentation

## 2014-11-29 DIAGNOSIS — E079 Disorder of thyroid, unspecified: Secondary | ICD-10-CM | POA: Insufficient documentation

## 2014-11-29 NOTE — ED Notes (Signed)
Pt states has had stable abd hernia. Pt states naval started bleeding this am. Denies injury. No swelling more than  Usual per pt. Nad.

## 2014-11-29 NOTE — ED Provider Notes (Signed)
CSN: [id number]     Arrival date & time 11/29/14  1514 History  This chart was scribed for non-physician practitioner Evalee Jefferson, PA-C working with Noemi Chapel, MD by Zola Button, ED Scribe. This patient was seen in room APFT23/APFT23 and the patient's care was started at 3:41 PM.      Chief Complaint  Patient presents with  . Hernia   The history is provided by the patient. No language interpreter was used.   HPI Comments: Catherine Turner is a 59 y.o. female who presents to the Emergency Department complaining bleeding from her umbilicus this morning. She has an umbilical hernia that was distended and tender for about an hour last night, but resolved while she slept and today denies any pain, but noted dried blood around her umbilicus after attending church today.  She notes that she ate tacos with corn yesterday, although she has difficulty digesting corn, which normally causes abdominal bloating and suspects this may have caused yesterdays pain and swelling.  When she last had her colonoscopy over 3 months ago, patient was told by Dr. Gala Romney to have her hernia checked out if it ever protruded or started bleeding. Patient denies any injuries. She has chronic problems with constipation. She has not had a bowel movement yet today, last normal bm was yesterday.Marland Kitchen  PCP: [facility]  Past Medical History  Diagnosis Date  . Asthma   . COPD (chronic obstructive pulmonary disease)   . Arm fracture     right arm   . DJD (degenerative joint disease) of hip   . Thyroid disease   . Hip pain   . PTSD (post-traumatic stress disorder)   . Insomnia    Past Surgical History  Procedure Laterality Date  . Colonoscopy N/A 08/12/2014    Procedure: COLONOSCOPY;  Surgeon: Daneil Dolin, MD;  Location: [facility]O SU[facility];  Service: Endoscopy;  Laterality: N/A;  115   Family History  Problem Relation Age of Onset  . COPD Mother   . Cancer - Lung Mother   . Hypertension Father   . Drug abuse Sister   . Dementia  Maternal Aunt   . Heart disease Maternal Grandfather   . Heart disease Maternal Grandmother   . Heart disease Paternal Grandfather   . Heart attack Paternal Grandfather   . Heart disease Paternal Grandmother   . ADD / ADHD Neg Hx   . Bipolar disorder Neg Hx   . OCD Neg Hx   . Paranoid behavior Neg Hx   . Schizophrenia Neg Hx   . Seizures Neg Hx   . Sexual abuse Neg Hx   . Physical abuse Other   . Anxiety disorder Other   . Birth defects Other     [name]  . Alcohol abuse Paternal Aunt   . Alcohol abuse Paternal Uncle    History  Substance Use Topics  . Smoking status: Passive Smoke Exposure - Never Smoker  . Smokeless tobacco: Never Used  . Alcohol Use: No   OB History    No data available     Review of Systems  Constitutional: Negative for fever.  HENT: Negative for congestion and sore throat.   Eyes: Negative.   Respiratory: Negative for chest tightness and shortness of breath.   Cardiovascular: Negative for chest pain.  Gastrointestinal: Positive for abdominal pain and constipation. Negative for nausea.  Genitourinary: Negative.   Musculoskeletal: Negative for joint swelling, arthralgias and neck pain.  Skin: Negative.  Negative for rash and  wound.  Neurological: Negative for dizziness, weakness, light-headedness, numbness and headaches.  Psychiatric/Behavioral: Negative.       Allergies  Advair diskus; Escitalopram; Jasmine fragrance; Breo ellipta; Diclofenac; Eggs or egg-derived products; Lactase; Molds & smuts; Peanuts; Pravastatin; Latex; Pork-derived products; and Sulfa antibiotics  Home Medications   Prior to Admission medications   Medication Sig Start Date End Date Taking? Authorizing Provider  albuterol (PROVENTIL HFA;VENTOLIN HFA) 108 (90 BASE) MCG/ACT inhaler Inhale 2 puffs into the lungs every 4 (four) hours as needed for wheezing. 10/01/12   Ezequiel Essex, MD  albuterol (PROVENTIL) (2.5 MG/3ML) 0.083% nebulizer solution Take 2.5 mg by  nebulization every 6 (six) hours as needed for wheezing. 04/12/12   Francine Graven, DO  benzonatate (TESSALON) 100 MG capsule Take 100 mg by mouth daily as needed for cough.    Historical Provider, MD  Cholecalciferol (VITAMIN D PO) Take 1 tablet by mouth daily.    Historical Provider, MD  diphenhydrAMINE (BENADRYL) 25 MG tablet Take 50 mg by mouth every 8 (eight) hours as needed for allergies.     Historical Provider, MD  gabapentin (NEURONTIN) 100 MG capsule Take 100 mg by mouth at bedtime.    Historical Provider, MD  Homeopathic Products (T-RELIEF ARTHRITIS MOBILITY) TABS Take 1 tablet by mouth 2 (two) times daily.    Historical Provider, MD  levothyroxine (SYNTHROID, LEVOTHROID) 100 MCG tablet Take 100 mcg by mouth every morning.     Historical Provider, MD  Melatonin 10 MG TABS Take 1 tablet by mouth at bedtime.    Historical Provider, MD  naproxen (NAPROSYN) 500 MG tablet Take 500 mg by mouth 2 (two) times daily as needed for mild pain.     Historical Provider, MD  omega-3 acid ethyl esters (LOVAZA) 1 G capsule Take 1 g by mouth daily.    Historical Provider, MD  peg 3350 powder (MOVIPREP) 100 G SOLR Take 1 kit (200 g total) by mouth as directed. 07/27/14   Daneil Dolin, MD   BP 140/59 mmHg  Pulse 76  Temp(Src) 98.9 F (37.2 C) (Oral)  Resp 16  Ht 5' 5"  (1.651 m)  Wt 302 lb 6 oz (137.156 kg)  BMI 50.32 kg/m2  SpO2 98% Physical Exam  Constitutional: She appears well-developed and well-nourished.  HENT:  Head: Normocephalic and atraumatic.  Eyes: Conjunctivae are normal.  Neck: Normal range of motion.  Cardiovascular: Normal rate, regular rhythm, normal heart sounds and intact distal pulses.   Pulmonary/Chest: Effort normal and breath sounds normal. She has no wheezes.  Abdominal: Soft. Bowel sounds are normal. She exhibits no mass. There is no tenderness.  Adipose abdomen. No mass. No palpable herniation. There does appear to be a subtle weakness at her umbilicus, but no  protruding hernia. Few flecks of dried blood in her umbilicus with no obvious source of bleeding. No erythema. No drainage.  Musculoskeletal: Normal range of motion.  Neurological: She is alert.  Skin: Skin is warm and dry.  Psychiatric: She has a normal mood and affect.  Nursing note and vitals reviewed.   ED Course  Procedures  DIAGNOSTIC STUDIES: Oxygen Saturation is 98% on room air, normal by my interpretation.    COORDINATION OF CARE: 3:58 PM-Discussed treatment plan which includes referral to use if needed with pt at bedside and pt agreed to plan.    Labs Review Labs Reviewed - No data to display  Imaging Review No results found.   EKG Interpretation None      MDM  Final diagnoses:  Umbilical bleeding  Umbilical hernia without obstruction and without gangrene    Pt iwith h/o umbilical bleeding with no source of bleeding found on exam today,  no evidence of problematic hernia at this time as abd is soft and without mass or tenderness.  She was given reassurance and advised she should f/u with gen surg for definitive surgical repair prn.  Discussed signs of emergent need for re-eval of hernia.  Prn f/u anticipated.  I personally performed the services described in this documentation, which was scribed in my presence. The recorded information has been reviewed and is accurate.   Evalee Jefferson, PA-C 11/30/14 2213  Noemi Chapel, MD 12/03/14 [phone]

## 2014-11-29 NOTE — Discharge Instructions (Signed)

## 2014-11-30 ENCOUNTER — Telehealth: Payer: Self-pay

## 2014-11-30 NOTE — Telephone Encounter (Signed)
Pt states she was taking weight loss program supplements. ( mag citrate, B6 complex)   States she will call the surgeon

## 2014-11-30 NOTE — Telephone Encounter (Signed)
What supplements?   Yes, needs referral as ED has arranged.

## 2014-11-30 NOTE — Telephone Encounter (Signed)
Pt called and states that she was seen in the ER yesterday for a bleeding umbilical hernia.  States that RMR told her that if she ever had problems occur with the hernia to call our office.  States that is out of her supplements that helped it. She really doesn't want to see a surgeon until she hears from Dr. Gala Romney. ER referred her to Dr. Arnoldo Morale. Please Advise

## 2014-12-01 NOTE — Telephone Encounter (Signed)
Did we prescribe any of this for her? I don't believe so.

## 2014-12-01 NOTE — Telephone Encounter (Signed)
No

## 2014-12-07 ENCOUNTER — Ambulatory Visit (INDEPENDENT_AMBULATORY_CARE_PROVIDER_SITE_OTHER): Payer: Medicare Other | Admitting: Internal Medicine

## 2014-12-07 ENCOUNTER — Encounter: Payer: Self-pay | Admitting: Internal Medicine

## 2014-12-07 VITALS — BP 130/76 | HR 60 | Ht 65.0 in | Wt 305.0 lb

## 2014-12-07 DIAGNOSIS — J4520 Mild intermittent asthma, uncomplicated: Secondary | ICD-10-CM

## 2014-12-07 DIAGNOSIS — G4733 Obstructive sleep apnea (adult) (pediatric): Secondary | ICD-10-CM | POA: Diagnosis not present

## 2014-12-07 NOTE — Patient Instructions (Signed)
We can continue CPAP 11/ [facility]. Please talk with them about mask fit/ replacement. If they need information from me they will contact us.
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
## 2014-12-18 NOTE — H&P (Signed)
  NTS SOAP Note  Vital Signs:  Vitals as of: 0/30/0923: Systolic 300: Diastolic 60: Heart Rate 70: Temp 87F: Height 23ft 5in: Weight 306Lbs 0 Ounces: Pain Level 2: BMI 50.92  BMI : 50.92 kg/m2  Subjective: This 59 year old female presents for of an umbilical hernia.  Noted recently while trying to lose weight.  Sore to touch, made worse with straining.  Was irritated and bleeding recently, though has not drained for a few weeks.  Review of Symptoms:  Constitutional:fatigue Head:unremarkable Eyes:unremarkable   sinus problems Cardiovascular:  unremarkable Respiratory:dyspnea, wheezing, cough Gastrointestinabdominal pain Genitourinary:unremarkable   joint, neck, and back pain dry, boils Hematolgic/Lymphatic:unremarkable   hay fever   Past Medical History:  Reviewed  Past Medical History  Surgical History: unremarkable Medical Problems: obesity, asthma, hypothyroidism Allergies: advair, diclofenac, eggs, pravastatin, sulfa, pork Medications: neurontin, synthroid, proventil   Social History:Reviewed  Social History  Preferred Language: English Race:  White Ethnicity: Not Hispanic / Latino Age: 55 year Marital Status:  S Alcohol: no   Smoking Status: Never smoker reviewed on 12/17/2014 Functional Status reviewed on 12/17/2014 ------------------------------------------------ Bathing: Normal Cooking: Normal Dressing: Normal Driving: Normal Eating: Normal Managing Meds: Normal Oral Care: Normal Shopping: Normal Toileting: Normal Transferring: Normal Walking: Normal Cognitive Status reviewed on 12/17/2014 ------------------------------------------------ Attention: Normal Decision Making: Normal Language: Normal Memory: Normal Motor: Normal Perception: Normal Problem Solving: Normal Visual and Spatial: Normal   Family History:Reviewed  Family Health History Mother, Deceased; Lung cancer;  Father, Living; Healthy;     Objective  Information: General:Well appearing, well nourished in no distress. Heart:RRR, no murmur Lungs:  CTA bilaterally, no wheezes, rhonchi, rales.  Breathing unlabored. Abdomen:Soft, NT/ND, no HSM, no masses.  Reducible umbilical hernia.  Assessment:Umbilical hernia  Diagnoses: 762.2  Q33.3 Umbilical hernia (Umbilical hernia without obstruction or gangrene)  Procedures: 54562 - OFFICE OUTPATIENT NEW 30 MINUTES    Plan:  Scheduled for umbilical herniorrhaphy with mesh on 12/30/14.   Patient Education:Alternative treatments to surgery were discussed with patient (and family).  Risks and benefits  of procedure including bleeding, infection, mesh use, and recurrence were fully explained to the patient (and family) who gave informed consent. Patient/family questions were addressed.  Follow-up:Pending Surgery

## 2014-12-20 NOTE — Assessment & Plan Note (Signed)
Adequate control. Using her nebulizer machine about 3 times a week which is a good measure.

## 2014-12-20 NOTE — Assessment & Plan Note (Addendum)
Download confirms good compliance and control. She is also trying to lose weight which is a big help. Plan-she will get CPAP mask refitted by her DME company

## 2014-12-23 ENCOUNTER — Encounter (HOSPITAL_COMMUNITY): Payer: Self-pay

## 2014-12-23 ENCOUNTER — Encounter (HOSPITAL_COMMUNITY)
Admission: RE | Admit: 2014-12-23 | Discharge: 2014-12-23 | Disposition: A | Discharge status: Home / Self Care | Payer: Medicare Other | Source: Ambulatory Visit | Attending: General Surgery | Admitting: General Surgery

## 2014-12-23 ENCOUNTER — Other Ambulatory Visit: Payer: Self-pay

## 2014-12-23 DIAGNOSIS — K429 Umbilical hernia without obstruction or gangrene: Secondary | ICD-10-CM | POA: Diagnosis not present

## 2014-12-23 DIAGNOSIS — Z01810 Encounter for preprocedural cardiovascular examination: Secondary | ICD-10-CM | POA: Insufficient documentation

## 2014-12-23 HISTORY — DX: Other injury of unspecified body region, initial encounter: T14.8XXA

## 2014-12-23 HISTORY — DX: Other specified abnormal findings of blood chemistry: R79.89

## 2014-12-23 HISTORY — DX: Other intervertebral disc displacement, lumbar region: M51.26

## 2014-12-23 HISTORY — DX: Sleep apnea, unspecified: G47.30

## 2014-12-23 HISTORY — DX: Other intervertebral disc degeneration, lumbar region without mention of lumbar back pain or lower extremity pain: M51.369

## 2014-12-23 HISTORY — DX: Other complications of anesthesia, initial encounter: T88.59XA

## 2014-12-23 HISTORY — DX: Adverse effect of unspecified anesthetic, initial encounter: T41.45XA

## 2014-12-23 HISTORY — DX: Abnormal results of liver function studies: R94.5

## 2014-12-23 HISTORY — DX: Osteoarthritis of hip, unspecified: M16.9

## 2014-12-23 HISTORY — DX: Hypothyroidism, unspecified: E03.9

## 2014-12-23 HISTORY — DX: Other intervertebral disc degeneration, lumbar region: M51.36

## 2014-12-23 LAB — HEPATIC FUNCTION PANEL
ALT: 22 U/L (ref 14–54)
AST: 31 U/L (ref 15–41)
Albumin: 4.1 g/dL (ref 3.5–5.0)
Alkaline Phosphatase: 66 U/L (ref 38–126)
BILIRUBIN DIRECT: 0.1 mg/dL (ref 0.1–0.5)
Indirect Bilirubin: 0.6 mg/dL (ref 0.3–0.9)
Total Bilirubin: 0.7 mg/dL (ref 0.3–1.2)
Total Protein: 6.8 g/dL (ref 6.5–8.1)

## 2014-12-23 LAB — CBC WITH DIFFERENTIAL/PLATELET
Basophils Absolute: 0 10*3/uL (ref 0.0–0.1)
Basophils Relative: 1 % (ref 0–1)
EOS ABS: 0.2 10*3/uL (ref 0.0–0.7)
Eosinophils Relative: 3 % (ref 0–5)
HCT: 41.4 % (ref 36.0–46.0)
Hemoglobin: 13.5 g/dL (ref 12.0–15.0)
Lymphocytes Relative: 32 % (ref 12–46)
Lymphs Abs: 2.7 10*3/uL (ref 0.7–4.0)
MCH: 28.7 pg (ref 26.0–34.0)
MCHC: 32.6 g/dL (ref 30.0–36.0)
MCV: 88.1 fL (ref 78.0–100.0)
MONO ABS: 0.6 10*3/uL (ref 0.1–1.0)
Monocytes Relative: 7 % (ref 3–12)
Neutro Abs: 5 10*3/uL (ref 1.7–7.7)
Neutrophils Relative %: 59 % (ref 43–77)
PLATELETS: 177 10*3/uL (ref 150–400)
RBC: 4.7 MIL/uL (ref 3.87–5.11)
RDW: 14.8 % (ref 11.5–15.5)
WBC: 8.5 10*3/uL (ref 4.0–10.5)

## 2014-12-23 LAB — BASIC METABOLIC PANEL
Anion gap: 7 (ref 5–15)
BUN: 11 mg/dL (ref 6–20)
CO2: 30 mmol/L (ref 22–32)
Calcium: 9.4 mg/dL (ref 8.9–10.3)
Chloride: 107 mmol/L (ref 101–111)
Creatinine, Ser: 0.92 mg/dL (ref 0.44–1.00)
GFR calc Af Amer: >60 mL/min (ref >60)
GLUCOSE: 82 mg/dL (ref 65–99)
POTASSIUM: 4 mmol/L (ref 3.5–5.1)
SODIUM: 144 mmol/L (ref 135–145)

## 2014-12-23 NOTE — Patient Instructions (Signed)
Catherine Turner  12/23/2014   Your procedure is scheduled on:  12/30/2014  Report to [facility] at  700  AM.  Call this number if you have problems the morning of surgery: [phone]   Remember:   Do not eat food or drink liquids after midnight.   Take these medicines the morning of surgery with A SIP OF WATER:  Neurontin, levothyroxine. Take your nebulizer and your inhaler before you come.   Do not wear jewelry, make-up or nail polish.  Do not wear lotions, powders, or perfumes.  Do not shave 48 hours prior to surgery. Men may shave face and neck.  Do not bring valuables to the hospital.  [facility] is not responsible for any belongings or valuables.               Contacts, dentures or bridgework may not be worn into surgery.  Leave suitcase in the car. After surgery it may be brought to your room.  For patients admitted to the hospital, discharge time is determined by your treatment team.               Patients discharged the day of surgery will not be allowed to drive home.  Name and phone number of your driver: family  Special Instructions: Shower using CHG 2 nights before surgery and the night before surgery.  If you shower the day of surgery use CHG.  Use special wash - you have one bottle of CHG for all showers.  You should use approximately 1/3 of the bottle for each shower.   Please read over the following fact sheets that you were given: Pain Booklet, Coughing and Deep Breathing, Surgical Site Infection Prevention, Anesthesia Post-op Instructions and Care and Recovery After Surgery Hernia A hernia occurs when an internal organ pushes out through a weak spot in the abdominal wall. Hernias most commonly occur in the groin and around the navel. Hernias often can be pushed back into place (reduced). Most hernias tend to get worse over time. Some abdominal hernias can get stuck in the opening (irreducible or incarcerated hernia) and cannot be reduced. An irreducible abdominal  hernia which is tightly squeezed into the opening is at risk for impaired blood supply (strangulated hernia). A strangulated hernia is a medical emergency. Because of the risk for an irreducible or strangulated hernia, surgery may be recommended to repair a hernia. CAUSES   Heavy lifting.  Prolonged coughing.  Straining to have a bowel movement.  A cut (incision) made during an abdominal surgery. HOME CARE INSTRUCTIONS   Bed rest is not required. You may continue your normal activities.  Avoid lifting more than 10 pounds (4.5 kg) or straining.  Cough gently. If you are a smoker it is best to stop. Even the best hernia repair can break down with the continual strain of coughing. Even if you do not have your hernia repaired, a cough will continue to aggravate the problem.  Do not wear anything tight over your hernia. Do not try to keep it in with an outside bandage or truss. These can damage abdominal contents if they are trapped within the hernia sac.  Eat a normal diet.  Avoid constipation. Straining over long periods of time will increase hernia size and encourage breakdown of repairs. If you cannot do this with diet alone, stool softeners may be used. SEEK IMMEDIATE MEDICAL CARE IF:   You have a fever.  You develop increasing abdominal pain.  You  feel nauseous or vomit.  Your hernia is stuck outside the abdomen, looks discolored, feels hard, or is tender.  You have any changes in your bowel habits or in the hernia that are unusual for you.  You have increased pain or swelling around the hernia.  You cannot push the hernia back in place by applying gentle pressure while lying down. MAKE SURE YOU:   Understand these instructions.  Will watch your condition.  Will get help right away if you are not doing well or get worse. Document Released: 07/24/2005 Document Revised: 10/16/2011 Document Reviewed: 03/12/2008 [facility] Patient Information 2015 [place], [place]. This  information is not intended to replace advice given to you by your health care provider. Make sure you discuss any questions you have with your health care provider. PATIENT INSTRUCTIONS POST-ANESTHESIA  IMMEDIATELY FOLLOWING SURGERY:  Do not drive or operate machinery for the first twenty four hours after surgery.  Do not make any important decisions for twenty four hours after surgery or while taking narcotic pain medications or sedatives.  If you develop intractable nausea and vomiting or a severe headache please notify your doctor immediately.  FOLLOW-UP:  Please make an appointment with your surgeon as instructed. You do not need to follow up with anesthesia unless specifically instructed to do so.  WOUND CARE INSTRUCTIONS (if applicable):  Keep a dry clean dressing on the anesthesia/puncture wound site if there is drainage.  Once the wound has quit draining you may leave it open to air.  Generally you should leave the bandage intact for twenty four hours unless there is drainage.  If the epidural site drains for more than 36-48 hours please call the anesthesia department.  QUESTIONS?:  Please feel free to call your physician or the hospital operator if you have any questions, and they will be happy to assist you.

## 2014-12-29 NOTE — OR Nursing (Signed)
Abnormal EKG reported to Dr. Patsey Berthold

## 2014-12-30 ENCOUNTER — Ambulatory Visit (HOSPITAL_COMMUNITY): Payer: Medicare Other | Admitting: Anesthesiology

## 2014-12-30 ENCOUNTER — Encounter (HOSPITAL_COMMUNITY)
Admission: RE | Disposition: A | Discharge status: Home / Self Care | Payer: Self-pay | Source: Ambulatory Visit | Attending: General Surgery

## 2014-12-30 ENCOUNTER — Encounter (HOSPITAL_COMMUNITY): Payer: Self-pay

## 2014-12-30 ENCOUNTER — Ambulatory Visit (HOSPITAL_COMMUNITY)
Admission: RE | Admit: 2014-12-30 | Discharge: 2014-12-30 | Disposition: A | Discharge status: Home / Self Care | Payer: Medicare Other | Source: Ambulatory Visit | Attending: General Surgery | Admitting: General Surgery

## 2014-12-30 DIAGNOSIS — F419 Anxiety disorder, unspecified: Secondary | ICD-10-CM | POA: Diagnosis not present

## 2014-12-30 DIAGNOSIS — Z6841 Body Mass Index (BMI) 40.0 and over, adult: Secondary | ICD-10-CM | POA: Insufficient documentation

## 2014-12-30 DIAGNOSIS — Z9989 Dependence on other enabling machines and devices: Secondary | ICD-10-CM | POA: Insufficient documentation

## 2014-12-30 DIAGNOSIS — J45909 Unspecified asthma, uncomplicated: Secondary | ICD-10-CM | POA: Diagnosis not present

## 2014-12-30 DIAGNOSIS — J449 Chronic obstructive pulmonary disease, unspecified: Secondary | ICD-10-CM | POA: Diagnosis not present

## 2014-12-30 DIAGNOSIS — K420 Umbilical hernia with obstruction, without gangrene: Secondary | ICD-10-CM | POA: Insufficient documentation

## 2014-12-30 DIAGNOSIS — Z79899 Other long term (current) drug therapy: Secondary | ICD-10-CM | POA: Diagnosis not present

## 2014-12-30 DIAGNOSIS — K429 Umbilical hernia without obstruction or gangrene: Secondary | ICD-10-CM | POA: Diagnosis present

## 2014-12-30 DIAGNOSIS — G4730 Sleep apnea, unspecified: Secondary | ICD-10-CM | POA: Insufficient documentation

## 2014-12-30 DIAGNOSIS — E039 Hypothyroidism, unspecified: Secondary | ICD-10-CM | POA: Insufficient documentation

## 2014-12-30 HISTORY — PX: UMBILICAL HERNIA REPAIR: SHX196

## 2014-12-30 SURGERY — REPAIR, HERNIA, UMBILICAL, ADULT
Anesthesia: General | Site: Abdomen

## 2014-12-30 MED ORDER — SUCCINYLCHOLINE CHLORIDE 20 MG/ML IJ SOLN
INTRAMUSCULAR | Status: AC
  Filled 2014-12-30: qty 2

## 2014-12-30 MED ORDER — POVIDONE-IODINE 10 % OINT PACKET
TOPICAL_OINTMENT | CUTANEOUS | Status: DC | PRN
  Administered 2014-12-30: 1 via TOPICAL

## 2014-12-30 MED ORDER — FENTANYL CITRATE (PF) 100 MCG/2ML IJ SOLN
INTRAMUSCULAR | Status: DC | PRN
  Administered 2014-12-30 (×4): 50 ug via INTRAVENOUS

## 2014-12-30 MED ORDER — FENTANYL CITRATE (PF) 100 MCG/2ML IJ SOLN
INTRAMUSCULAR | Status: AC
  Filled 2014-12-30: qty 2

## 2014-12-30 MED ORDER — LACTATED RINGERS IV SOLN
INTRAVENOUS | Status: DC
  Administered 2014-12-30: 09:00:00 via INTRAVENOUS

## 2014-12-30 MED ORDER — BUPIVACAINE HCL (PF) 0.5 % IJ SOLN
INTRAMUSCULAR | Status: DC | PRN
  Administered 2014-12-30: 10 mL

## 2014-12-30 MED ORDER — FENTANYL CITRATE (PF) 100 MCG/2ML IJ SOLN
25.0000 ug | INTRAMUSCULAR | Status: DC | PRN
  Administered 2014-12-30 (×2): 50 ug via INTRAVENOUS

## 2014-12-30 MED ORDER — ONDANSETRON HCL 4 MG/2ML IJ SOLN
INTRAMUSCULAR | Status: AC
  Filled 2014-12-30: qty 2

## 2014-12-30 MED ORDER — PROPOFOL 10 MG/ML IV BOLUS
INTRAVENOUS | Status: DC | PRN
  Administered 2014-12-30: 140 mg via INTRAVENOUS

## 2014-12-30 MED ORDER — ONDANSETRON HCL 4 MG/2ML IJ SOLN
4.0000 mg | Freq: Once | INTRAMUSCULAR | Status: AC | PRN
  Administered 2014-12-30: 4 mg via INTRAVENOUS

## 2014-12-30 MED ORDER — CHLORHEXIDINE GLUCONATE 4 % EX LIQD: 1.0000 "application " | Freq: Once | CUTANEOUS | Status: DC

## 2014-12-30 MED ORDER — HYDROCODONE-ACETAMINOPHEN 5-325 MG PO TABS: 1.0000 | ORAL_TABLET | ORAL | Status: DC | PRN

## 2014-12-30 MED ORDER — ONDANSETRON HCL 4 MG/2ML IJ SOLN
4.0000 mg | Freq: Once | INTRAMUSCULAR | Status: AC
  Administered 2014-12-30: 4 mg via INTRAVENOUS

## 2014-12-30 MED ORDER — CEFAZOLIN SODIUM 1-5 GM-% IV SOLN
INTRAVENOUS | Status: AC
  Filled 2014-12-30: qty 50

## 2014-12-30 MED ORDER — POVIDONE-IODINE 10 % EX OINT
TOPICAL_OINTMENT | CUTANEOUS | Status: AC
  Filled 2014-12-30: qty 1

## 2014-12-30 MED ORDER — FENTANYL CITRATE (PF) 100 MCG/2ML IJ SOLN
25.0000 ug | INTRAMUSCULAR | Status: AC
  Administered 2014-12-30 (×2): 25 ug via INTRAVENOUS

## 2014-12-30 MED ORDER — GLYCOPYRROLATE 0.2 MG/ML IJ SOLN
INTRAMUSCULAR | Status: AC
  Filled 2014-12-30: qty 2

## 2014-12-30 MED ORDER — CEFAZOLIN SODIUM 10 G IJ SOLR: 3.0000 g | INTRAMUSCULAR | Status: DC

## 2014-12-30 MED ORDER — BUPIVACAINE HCL (PF) 0.5 % IJ SOLN
INTRAMUSCULAR | Status: AC
  Filled 2014-12-30: qty 30

## 2014-12-30 MED ORDER — CEFAZOLIN SODIUM-DEXTROSE 2-3 GM-% IV SOLR: 2.0000 g | INTRAVENOUS | Status: DC

## 2014-12-30 MED ORDER — LIDOCAINE HCL 1 % IJ SOLN
INTRAMUSCULAR | Status: DC | PRN
  Administered 2014-12-30: 30 mg via INTRADERMAL

## 2014-12-30 MED ORDER — MIDAZOLAM HCL 2 MG/2ML IJ SOLN
1.0000 mg | INTRAMUSCULAR | Status: DC | PRN
  Administered 2014-12-30: 2 mg via INTRAVENOUS

## 2014-12-30 MED ORDER — LIDOCAINE HCL (PF) 1 % IJ SOLN
INTRAMUSCULAR | Status: AC
  Filled 2014-12-30: qty 5

## 2014-12-30 MED ORDER — MIDAZOLAM HCL 2 MG/2ML IJ SOLN
INTRAMUSCULAR | Status: AC
  Filled 2014-12-30: qty 2

## 2014-12-30 MED ORDER — BUPIVACAINE LIPOSOME 1.3 % IJ SUSP
INTRAMUSCULAR | Status: AC
  Filled 2014-12-30: qty 20

## 2014-12-30 MED ORDER — MIDAZOLAM HCL 5 MG/5ML IJ SOLN
INTRAMUSCULAR | Status: DC | PRN
  Administered 2014-12-30 (×2): 2 mg via INTRAVENOUS

## 2014-12-30 MED ORDER — PROPOFOL 10 MG/ML IV BOLUS
INTRAVENOUS | Status: AC
  Filled 2014-12-30: qty 20

## 2014-12-30 MED ORDER — CEFAZOLIN SODIUM 1-5 GM-% IV SOLN: 1.0000 g | INTRAVENOUS | Status: DC

## 2014-12-30 MED ORDER — DEXTROSE 5 % IV SOLN
3.0000 g | INTRAVENOUS | Status: DC | PRN
  Administered 2014-12-30: 3 g via INTRAVENOUS

## 2014-12-30 MED ORDER — FENTANYL CITRATE (PF) 250 MCG/5ML IJ SOLN
INTRAMUSCULAR | Status: AC
  Filled 2014-12-30: qty 5

## 2014-12-30 MED ORDER — CEFAZOLIN SODIUM-DEXTROSE 2-3 GM-% IV SOLR
INTRAVENOUS | Status: AC
  Filled 2014-12-30: qty 50

## 2014-12-30 MED ORDER — ROCURONIUM BROMIDE 50 MG/5ML IV SOLN
INTRAVENOUS | Status: AC
  Filled 2014-12-30: qty 2

## 2014-12-30 MED ORDER — SODIUM CHLORIDE 0.9 % IR SOLN
Status: DC | PRN
  Administered 2014-12-30: 1000 mL

## 2014-12-30 SURGICAL SUPPLY — 33 items
BAG HAMPER (MISCELLANEOUS) ×2 IMPLANT
BLADE SURG SZ11 CARB STEEL (BLADE) ×2 IMPLANT
CHLORAPREP W/TINT 26ML (MISCELLANEOUS) ×2 IMPLANT
CLOTH BEACON ORANGE TIMEOUT ST (SAFETY) ×2 IMPLANT
COVER LIGHT HANDLE STERIS (MISCELLANEOUS) ×4 IMPLANT
DECANTER SPIKE VIAL GLASS SM (MISCELLANEOUS) ×2 IMPLANT
ELECT REM PT RETURN 9FT ADLT (ELECTROSURGICAL) ×2
ELECTRODE REM PT RTRN 9FT ADLT (ELECTROSURGICAL) ×1 IMPLANT
GLOVE BIOGEL PI IND STRL 7.0 (GLOVE) ×3 IMPLANT
GLOVE BIOGEL PI INDICATOR 7.0 (GLOVE) ×3
GLOVE SS N UNI LF 7.0 STRL (GLOVE) ×6 IMPLANT
GLOVE SURG SS PI 7.5 STRL IVOR (GLOVE) ×4 IMPLANT
GOWN STRL REUS W/ TWL LRG LVL3 (GOWN DISPOSABLE) ×1 IMPLANT
GOWN STRL REUS W/TWL LRG LVL3 (GOWN DISPOSABLE) ×9 IMPLANT
INST SET MINOR GENERAL (KITS) ×2 IMPLANT
KIT ROOM TURNOVER APOR (KITS) ×2 IMPLANT
LIGASURE IMPACT 36 18CM CVD LR (INSTRUMENTS) ×2 IMPLANT
MANIFOLD NEPTUNE II (INSTRUMENTS) ×2 IMPLANT
MESH VENTRALEX ST 8CM LRG (Mesh General) ×2 IMPLANT
NEEDLE HYPO 25X1 1.5 SAFETY (NEEDLE) ×2 IMPLANT
NS IRRIG 1000ML POUR BTL (IV SOLUTION) ×2 IMPLANT
PACK MINOR (CUSTOM PROCEDURE TRAY) ×2 IMPLANT
PAD ARMBOARD 7.5X6 YLW CONV (MISCELLANEOUS) ×2 IMPLANT
SET BASIN LINEN APH (SET/KITS/TRAYS/PACK) ×2 IMPLANT
SPONGE GAUZE 2X2 8PLY STRL LF (GAUZE/BANDAGES/DRESSINGS) ×4 IMPLANT
STAPLER VISISTAT (STAPLE) ×2 IMPLANT
SUT ETHIBOND NAB MO 7 #0 18IN (SUTURE) ×2 IMPLANT
SUT VIC AB 2-0 CT2 27 (SUTURE) ×2 IMPLANT
SUT VIC AB 3-0 SH 27 (SUTURE) ×1
SUT VIC AB 3-0 SH 27X BRD (SUTURE) ×1 IMPLANT
SUT VICRYL AB 3 0 TIES (SUTURE) IMPLANT
SYR CONTROL 10ML LL (SYRINGE) ×2 IMPLANT
TAPE CLOTH SURG 4X10 WHT LF (GAUZE/BANDAGES/DRESSINGS) ×2 IMPLANT

## 2014-12-30 NOTE — Transfer of Care (Signed)
Immediate Anesthesia Transfer of Care Note  Patient: ROSILAND SEN  Procedure(s) Performed: Procedure(s): HERNIA REPAIR UMBILICAL ADULT WITH MESH (N/A)  Patient Location: PACU  Anesthesia Type:General  Level of Consciousness: sedated  Airway & Oxygen Therapy: Patient Spontanous Breathing and Patient connected to face mask oxygen  Post-op Assessment: Report given to RN, Post -op Vital signs reviewed and stable and Patient moving all extremities  Post vital signs: Reviewed and stable  Last Vitals:  Filed Vitals:   12/30/14 0738  BP: 129/66  Pulse: 65  Temp: 37.1 C  Resp: 18    Complications: No apparent anesthesia complications

## 2014-12-30 NOTE — Anesthesia Procedure Notes (Signed)
Procedure Name: LMA Insertion Date/Time: 12/30/2014 8:45 AM Performed by: Charmaine Downs Pre-anesthesia Checklist: Patient identified, Emergency Drugs available, Suction available and Patient being monitored Patient Re-evaluated:Patient Re-evaluated prior to inductionOxygen Delivery Method: Circle system utilized Preoxygenation: Pre-oxygenation with 100% oxygen Intubation Type: IV induction Ventilation: Mask ventilation without difficulty LMA: LMA inserted LMA Size: 4.0 Grade View: Grade I Number of attempts: 1 Placement Confirmation: positive ETCO2 and breath sounds checked- equal and bilateral Tube secured with: Tape Dental Injury: Teeth and Oropharynx as per pre-operative assessment

## 2014-12-30 NOTE — Anesthesia Postprocedure Evaluation (Signed)
  Anesthesia Post-op Note  Patient: Catherine Turner  Procedure(s) Performed: Procedure(s): HERNIA REPAIR UMBILICAL ADULT WITH MESH (N/A)  Patient Location: PACU  Anesthesia Type:General  Level of Consciousness: awake, alert , oriented and patient cooperative  Airway and Oxygen Therapy: Patient Spontanous Breathing  Post-op Pain: 4 /10, moderate  Post-op Assessment: Post-op Vital signs reviewed, Patient's Cardiovascular Status Stable, Respiratory Function Stable, Patent Airway, No signs of Nausea or vomiting and Pain level controlled  Post-op Vital Signs: Reviewed and stable  Last Vitals:  Filed Vitals:   12/30/14 0945  BP: 139/67  Pulse: 82  Temp:   Resp: 13    Complications: No apparent anesthesia complications

## 2014-12-30 NOTE — Anesthesia Preprocedure Evaluation (Signed)
Anesthesia Evaluation  Patient identified by MRN, date of birth, ID band Patient awake    Reviewed: Allergy & Precautions, NPO status , Patient's Chart, lab work & pertinent test results  Airway Mallampati: I  TM Distance: >3 FB     Dental  (+) Poor Dentition, Missing, Chipped, Caps, Dental Advisory Given   Pulmonary shortness of breath, sleep apnea and Continuous Positive Airway Pressure Ventilation , COPD breath sounds clear to auscultation        Cardiovascular negative cardio ROS  Rhythm:Regular Rate:Normal     Neuro/Psych PSYCHIATRIC DISORDERS Anxiety  Neuromuscular disease    GI/Hepatic negative GI ROS,   Endo/Other  Hypothyroidism   Renal/GU      Musculoskeletal   Abdominal   Peds  Hematology   Anesthesia Other Findings   Reproductive/Obstetrics                             Anesthesia Physical Anesthesia Plan  ASA: III  Anesthesia Plan: General   Post-op Pain Management:    Induction: Intravenous  Airway Management Planned: LMA  Additional Equipment:   Intra-op Plan:   Post-operative Plan: Extubation in OR  Informed Consent: I have reviewed the patients History and Physical, chart, labs and discussed the procedure including the risks, benefits and alternatives for the proposed anesthesia with the patient or authorized representative who has indicated his/her understanding and acceptance.     Plan Discussed with:   Anesthesia Plan Comments:         Anesthesia Quick Evaluation

## 2014-12-30 NOTE — Interval H&P Note (Signed)
History and Physical Interval Note:  12/30/2014 8:26 AM  Catherine Turner  has presented today for surgery, with the diagnosis of umbilical hernia  The various methods of treatment have been discussed with the patient and family. After consideration of risks, benefits and other options for treatment, the patient has consented to  Procedure(s): HERNIA REPAIR UMBILICAL ADULT WITH MESH (N/A) as a surgical intervention .  The patient's history has been reviewed, patient examined, no change in status, stable for surgery.  I have reviewed the patient's chart and labs.  Questions were answered to the patient's satisfaction.     Aviva Signs A

## 2014-12-30 NOTE — Discharge Instructions (Signed)
Open Hernia Repair, Care After °Refer to this sheet in the next few weeks. These instructions provide you with information on caring for yourself after your procedure. Your health care provider may also give you more specific instructions. Your treatment has been planned according to current medical practices, but problems sometimes occur. Call your health care provider if you have any problems or questions after your procedure. °WHAT TO EXPECT AFTER THE PROCEDURE °After your procedure, it is typical to have the following: °· Pain in your abdomen, especially along your incision. You will be given pain medicines to control the pain. °· Constipation. You may be given a stool softener to help prevent this. °HOME CARE INSTRUCTIONS  °· Only take over-the-counter or prescription medicines as directed by your health care provider. °· Keep the wound dry and clean. You may wash the wound gently with soap and water 48 hours after surgery. Gently blot or dab the wound dry. Do not take baths, use swimming pools, or use hot tubs for 10 days or until your health care provider approves. °· Change bandages (dressings) as directed by your health care provider. °· Continue your normal diet as directed by your health care provider. Eat plenty of fruits and vegetables to help prevent constipation. °· Drink enough fluids to keep your urine clear or pale yellow. This also helps prevent constipation. °· Do not drive until your health care provider says it is okay. °· Do not lift anything heavier than 10 pounds (4.5 kg) or play contact sports for 4 weeks or until your health care provider approves. °· Follow up with your health care provider as directed. Ask your health care provider when to make an appointment to have your stitches (sutures) or staples removed. °SEEK MEDICAL CARE IF:  °· You have increased bleeding coming from the incision site. °· You have blood in your stool. °· You have increasing pain in the wound. °· You see redness  or swelling in the wound. °· You have fluid (pus) coming from the wound. °· You have a fever. °· You notice a bad smell coming from the wound or dressing. °SEEK IMMEDIATE MEDICAL CARE IF:  °· You develop a rash. °· You have chest pain or shortness of breath. °· You feel lightheaded or feel faint. °Document Released: 02/10/2005 Document Revised: 05/14/2013 Document Reviewed: 03/05/2013 °ExitCare® Patient Information ©2015 [employer]. This information is not intended to replace advice given to you by your health care provider. Make sure you discuss any questions you have with your health care provider. ° °

## 2014-12-30 NOTE — Op Note (Signed)
Patient:  Catherine Turner  DOB:  [birth year]  MRN:  [id number]   Preop Diagnosis:  Umbilical hernia  Postop Diagnosis:  Same  Procedure:  Umbilical herniorrhaphy with mesh  Surgeon:  Aviva Signs, M.D.  Anes:  Gen. endotracheal  Indications:  Patient is a morbidly obese 59 year old white female who presents with an umbilical hernia. The risks and benefits of the procedure including bleeding, infection, mesh use, and the possibility of recurrence of the hernia were fully explained to the patient, who gave informed consent.  Procedure note:  The patient is placed the supine position. After induction of general endotracheal anesthesia, the abdomen was prepped and draped using the usual sterile technique with chloroprep. Surgical site confirmation was performed.  An infraumbilical incision was made down to the fascia. The umbilicus was freed away from the underlying hernia sac. The hernia sac was excised down to the fascia. Some incarcerated omentum was excised and disposed of using the LigaSure. A large Bard 8 cm ventralax ST patch was then inserted and secured to the fascia using 0 Ethibond interrupted sutures. The fascia was then closed over this transversely using 0 Ethibond interrupted sutures. The base the umbilicus was secured back to the fascia using a 2-0 Vicryl interrupted suture. Subcutaneous layer was reapproximated using 3-0 Vicryl interrupted sutures. The skin was closed using staples. 0.5% Sensorcaine was instilled the surrounding wound. Betadine ointment and a dry sterile dressing were applied.  All tape and needle counts were correct at the end of procedure. Patient was extubated in the operating room and transferred to PACU in stable condition.  Complications:  None  EBL:  Minimal  Specimen:  None

## 2014-12-31 ENCOUNTER — Encounter (HOSPITAL_COMMUNITY): Payer: Self-pay | Admitting: General Surgery

## 2014-12-31 ENCOUNTER — Telehealth: Payer: Self-pay | Admitting: Internal Medicine

## 2014-12-31 NOTE — Telephone Encounter (Signed)
Pt aware of recs.  Nothing further needed. 

## 2014-12-31 NOTE — Telephone Encounter (Signed)
Spoke with pt, states she had hernia repair yesterday, since then has been having lightheadedness worse when wearing cpap.  Pt states she will notice shallow breathing when laying down, then having to gasp for breath.  Pt does not have as much shallow breathing or lightheadedness while sitting or standing.  Pt does note an occasional prod cough with unknown color.  Pt is concerned that this could be related to the hydrocodone taken d/t her surgery.   Pt uses [facility] in [place].    CY please advise.  Thanks!  Allergies  Allergen Reactions  . Advair Diskus [Fluticasone-Salmeterol] Other (See Comments)    Blood in urine  . Escitalopram Anaphylaxis  . Jasmine Fragrance Anaphylaxis  . Breo Ellipta [Fluticasone Furoate-Vilanterol] Other (See Comments)    Swelling of the abdomin.   . Diclofenac Other (See Comments)    unknown  . Lactase Other (See Comments)    unknown  . Molds & Smuts Other (See Comments)    unknown  . Peanuts [Peanut Oil]   . Pravastatin Other (See Comments)    unknown  . Latex Rash  . Pork-Derived Products Rash  . Sulfa Antibiotics Rash

## 2014-12-31 NOTE — Telephone Encounter (Signed)
Suggest she elevate her head when sleeping for a while- extra pillows or sleep in a recliner

## 2015-01-08 ENCOUNTER — Encounter: Payer: Self-pay | Admitting: Internal Medicine

## 2015-04-01 ENCOUNTER — Telehealth: Payer: Self-pay | Admitting: Internal Medicine

## 2015-04-01 DIAGNOSIS — G4733 Obstructive sleep apnea (adult) (pediatric): Secondary | ICD-10-CM

## 2015-04-01 NOTE — Telephone Encounter (Signed)
Spoke with pt, aware of recs.  Orders placed.  Nothing further needed.

## 2015-04-01 NOTE — Telephone Encounter (Signed)
Spoke with pt, states she's been losing weight over the past few months and now her cpap mask isn't fitting.  Pt ordered a small mask that was too small, but the medium size mask is too big for her face.  She's now having increased headaches after wearing cpap, feels like she isn't getting enough air at night.   Pt's homecare company is recommending either a full face mask or a dental device.    CY please advise on recs.  Thanks!

## 2015-04-01 NOTE — Telephone Encounter (Signed)
Order- [facility]   Refit mask of choice to reduce leak, dx OSA; download for pressure compliance

## 2015-04-23 ENCOUNTER — Encounter (HOSPITAL_COMMUNITY): Payer: Self-pay

## 2015-04-23 ENCOUNTER — Emergency Department (HOSPITAL_COMMUNITY)
Admission: EM | Admit: 2015-04-23 | Discharge: 2015-04-23 | Disposition: A | Discharge status: Home / Self Care | Payer: Medicare Other | Attending: Emergency Medicine | Admitting: Emergency Medicine

## 2015-04-23 DIAGNOSIS — Z79899 Other long term (current) drug therapy: Secondary | ICD-10-CM | POA: Diagnosis not present

## 2015-04-23 DIAGNOSIS — G4730 Sleep apnea, unspecified: Secondary | ICD-10-CM | POA: Diagnosis not present

## 2015-04-23 DIAGNOSIS — M25511 Pain in right shoulder: Secondary | ICD-10-CM | POA: Diagnosis not present

## 2015-04-23 DIAGNOSIS — Z8781 Personal history of (healed) traumatic fracture: Secondary | ICD-10-CM | POA: Insufficient documentation

## 2015-04-23 DIAGNOSIS — E039 Hypothyroidism, unspecified: Secondary | ICD-10-CM | POA: Diagnosis not present

## 2015-04-23 DIAGNOSIS — G4700 Insomnia, unspecified: Secondary | ICD-10-CM | POA: Insufficient documentation

## 2015-04-23 DIAGNOSIS — M25552 Pain in left hip: Secondary | ICD-10-CM | POA: Diagnosis not present

## 2015-04-23 DIAGNOSIS — G8929 Other chronic pain: Secondary | ICD-10-CM | POA: Insufficient documentation

## 2015-04-23 DIAGNOSIS — J449 Chronic obstructive pulmonary disease, unspecified: Secondary | ICD-10-CM | POA: Diagnosis not present

## 2015-04-23 DIAGNOSIS — Z8659 Personal history of other mental and behavioral disorders: Secondary | ICD-10-CM | POA: Diagnosis not present

## 2015-04-23 DIAGNOSIS — Z91040 Latex allergy status: Secondary | ICD-10-CM | POA: Insufficient documentation

## 2015-04-23 MED ORDER — METHOCARBAMOL 500 MG PO TABS: 500.0000 mg | ORAL_TABLET | Freq: Four times a day (QID) | ORAL | Status: DC

## 2015-04-23 MED ORDER — GABAPENTIN 100 MG PO CAPS: 300.0000 mg | ORAL_CAPSULE | Freq: Two times a day (BID) | ORAL | Status: DC

## 2015-04-23 NOTE — Discharge Instructions (Signed)
Shoulder Pain  The shoulder is the joint that connects your arm to your body. Muscles and band-like tissues that connect bones to muscles (tendons) hold the joint together. Shoulder pain is felt if an injury or medical problem affects one or more parts of the shoulder.  HOME CARE   · Put ice on the sore area.  ¨ Put ice in a plastic bag.  ¨ Place a towel between your skin and the bag.  ¨ Leave the ice on for 15-20 minutes, 03-04 times a day for the first 2 days.  · Stop using cold packs if they do not help with the pain.  · If you were given something to keep your shoulder from moving (sling; shoulder immobilizer), wear it as told. Only take it off to shower or bathe.  · Move your arm as little as possible, but keep your hand moving to prevent puffiness (swelling).  · Squeeze a soft ball or foam pad as much as possible to help prevent swelling.  · Take medicine as told by your doctor.  GET HELP IF:  · You have progressing new pain in your arm, hand, or fingers.  · Your hand or fingers get cold.  · Your medicine does not help lessen your pain.  GET HELP RIGHT AWAY IF:   · Your arm, hand, or fingers are numb or tingling.  · Your arm, hand, or fingers are puffy (swollen), painful, or turn white or blue.  MAKE SURE YOU:   · Understand these instructions.  · Will watch your condition.  · Will get help right away if you are not doing well or get worse.  Document Released: 01/10/2008 Document Revised: 12/08/2013 Document Reviewed: 02/05/2012  ExitCare® Patient Information ©2015 [employer]. This information is not intended to replace advice given to you by your health care provider. Make sure you discuss any questions you have with your health care provider.

## 2015-04-23 NOTE — ED Notes (Signed)
Pt reports pain in left hip and r shoulder.  Reports hasn't slept since 6am Wednesday due to pain.

## 2015-04-23 NOTE — ED Provider Notes (Signed)
CSN: [id number]     Arrival date & time 04/23/15  0720 History   First MD Initiated Contact with Patient 04/23/15 [phone]     Chief Complaint  Patient presents with  . Shoulder Pain     HPI  Patient presents for evaluation of chronic pain in her shoulder and left hip. Has a long history of the right shoulder. Multiple previous diagnoses including arthritis, rotator cuff tendinitis, and thoracic outlet syndrome. States that she saw a "specialist" in [facility] metaphysis regarding a possible thoracic outlet. She was told "it wasn't causing any symptoms". No recommended interventions. Also has a long history of pain in rotator cuff tendinitis. See Dr. Aline Brochure here, as well as a with [facility] surgeon in [place] who did a "destroyed injection" this was over a year ago. Also has chronic left hip pain. Is been getting physical therapy on the hip. States that her PT started working on her shoulder again last Wednesday and she's not been able to sleep since.  Past Medical History  Diagnosis Date  . Asthma   . COPD (chronic obstructive pulmonary disease)   . Arm fracture     right arm   . DJD (degenerative joint disease) of hip   . Thyroid disease   . Hip pain   . PTSD (post-traumatic stress disorder)   . Insomnia   . Complication of anesthesia     pt sts, "I am a very shallow breather and they have to do something special for me"/  . Sleep apnea     has CPAP  . Hypothyroidism   . Elevated LFTs   . Bulging lumbar disc   . Degenerative joint disease (DJD) of hip   . Nerve damage     right arm.   Past Surgical History  Procedure Laterality Date  . Colonoscopy N/A 08/12/2014    Procedure: COLONOSCOPY;  Surgeon: Daneil Dolin, MD;  Location: [facility];  Service: Endoscopy;  Laterality: N/A;  115  . Umbilical hernia repair N/A 12/30/2014    Procedure: HERNIA REPAIR UMBILICAL ADULT WITH MESH;  Surgeon: Aviva Signs Md, MD;  Location: [facility] OR[facility];  Service: General;  Laterality: N/A;  . Thumb surgery      Family History  Problem Relation Age of Onset  . COPD Mother   . Cancer - Lung Mother   . Hypertension Father   . Drug abuse Sister   . Dementia Maternal Aunt   . Heart disease Maternal Grandfather   . Heart disease Maternal Grandmother   . Heart disease Paternal Grandfather   . Heart attack Paternal Grandfather   . Heart disease Paternal Grandmother   . ADD / ADHD Neg Hx   . Bipolar disorder Neg Hx   . OCD Neg Hx   . Paranoid behavior Neg Hx   . Schizophrenia Neg Hx   . Seizures Neg Hx   . Sexual abuse Neg Hx   . Physical abuse Other   . Anxiety disorder Other   . Birth defects Other     [name]  . Alcohol abuse Paternal Aunt   . Alcohol abuse Paternal Uncle    Social History  Substance Use Topics  . Smoking status: Passive Smoke Exposure - Never Smoker  . Smokeless tobacco: Never Used  . Alcohol Use: No     Comment: 4 oz wine   OB History    No data available     Review of Systems  Constitutional: Negative for fever, chills, diaphoresis, appetite change  and fatigue.  HENT: Negative for mouth sores, sore throat and trouble swallowing.   Eyes: Negative for visual disturbance.  Respiratory: Negative for cough, chest tightness, shortness of breath and wheezing.   Cardiovascular: Negative for chest pain.  Gastrointestinal: Negative for nausea, vomiting, abdominal pain, diarrhea and abdominal distention.  Endocrine: Negative for polydipsia, polyphagia and polyuria.  Genitourinary: Negative for dysuria, frequency and hematuria.  Musculoskeletal: Positive for arthralgias. Negative for gait problem.  Skin: Negative for color change, pallor and rash.  Neurological: Negative for dizziness, syncope, light-headedness and headaches.  Hematological: Does not bruise/bleed easily.  Psychiatric/Behavioral: Negative for behavioral problems and confusion.      Allergies  Advair diskus; Escitalopram; Jasmine fragrance; Breo ellipta; Diclofenac; Lactase; Molds &  smuts; Peanuts; Pravastatin; Latex; Pork-derived products; and Sulfa antibiotics  Home Medications   Prior to Admission medications   Medication Sig Start Date End Date Taking? Authorizing Provider  albuterol (PROVENTIL HFA;VENTOLIN HFA) 108 (90 BASE) MCG/ACT inhaler Inhale 2 puffs into the lungs every 4 (four) hours as needed for wheezing. 10/01/12   Ezequiel Essex, MD  albuterol (PROVENTIL) (2.5 MG/3ML) 0.083% nebulizer solution Take 2.5 mg by nebulization every 6 (six) hours as needed for wheezing. 04/12/12   Francine Graven, DO  Cholecalciferol (VITAMIN D PO) Take 1 tablet by mouth daily.    Historical Provider, MD  gabapentin (NEURONTIN) 100 MG capsule Take 3 capsules (300 mg total) by mouth 2 (two) times daily. 04/23/15   Tanna Furry, MD  HYDROcodone-acetaminophen (NORCO/VICODIN) 5-325 MG per tablet Take 1-2 tablets by mouth every 4 (four) hours as needed for moderate pain. 12/30/14 12/30/15  Aviva Signs, MD  levothyroxine (SYNTHROID, LEVOTHROID) 100 MCG tablet Take 100 mcg by mouth every morning.     Historical Provider, MD  MAGNESIUM CITRATE PO Take 300 mg by mouth at bedtime.    Historical Provider, MD  Melatonin 10 MG CAPS Take 10 mg by mouth at bedtime.    Historical Provider, MD  methocarbamol (ROBAXIN) 500 MG tablet Take 1 tablet (500 mg total) by mouth 4 (four) times daily. 04/23/15   Tanna Furry, MD  Omega 3 1000 MG CAPS Take 4,000 mg by mouth daily.    Historical Provider, MD  OVER THE COUNTER MEDICATION Take 1 capsule by mouth daily. Formula one herb blend for colon health    Historical Provider, MD  Pyridoxine HCl (VITAMIN B-6) 250 MG tablet Take 250 mg by mouth daily.    Historical Provider, MD  Wheat Dextrin (BENEFIBER PO) Take 60 g by mouth daily.    Historical Provider, MD   BP 128/69 mmHg  Pulse 64  Temp(Src) 98 F (36.7 C) (Oral)  Resp 18  Ht 5\' 5"  (1.651 m)  Wt 306 lb (138.801 kg)  BMI 50.92 kg/m2  SpO2 98% Physical Exam  Constitutional: She is oriented to person,  place, and time. She appears well-developed and well-nourished. No distress.  HENT:  Head: Normocephalic.  Eyes: Conjunctivae are normal. Pupils are equal, round, and reactive to light. No scleral icterus.  Neck: Normal range of motion. Neck supple. No thyromegaly present.  Cardiovascular: Normal rate and regular rhythm.  Exam reveals no gallop and no friction rub.   No murmur heard. Pulmonary/Chest: Effort normal and breath sounds normal. No respiratory distress. She has no wheezes. She has no rales.  Abdominal: Soft. Bowel sounds are normal. She exhibits no distension. There is no tenderness. There is no rebound.  Musculoskeletal: Normal range of motion.  Has no limitation of her shoulder  as we speak and his examiner. She is able to lift and elevate and rotate the shoulder without any apparent discomfort. Does have some reproducible pain with biceps flexion or external rotation.  Walks without an abnormal gait. No direct tenderness over the trochanter.  Neurological: She is alert and oriented to person, place, and time.  Skin: Skin is warm and dry. No rash noted.  Psychiatric: She has a normal mood and affect. Her behavior is normal.    ED Course  Procedures (including critical care time) Labs Review Labs Reviewed - No data to display  Imaging Review No results found. I have personally reviewed and evaluated these images and lab results as part of my medical decision-making.   EKG Interpretation None      MDM   Final diagnoses:  Right shoulder pain    Had previously been on gabapentin and felt like it helped her. States she doesn't tolerate "normal pain medications". We agreed with treatment with muscle relaxant with Robaxin, and gabapentin.    Tanna Furry, MD 04/23/15 [phone]

## 2015-05-10 ENCOUNTER — Ambulatory Visit (INDEPENDENT_AMBULATORY_CARE_PROVIDER_SITE_OTHER): Payer: No Typology Code available for payment source | Admitting: Psychology

## 2015-05-10 ENCOUNTER — Encounter (HOSPITAL_COMMUNITY): Payer: Self-pay | Admitting: Psychology

## 2015-05-10 DIAGNOSIS — F5105 Insomnia due to other mental disorder: Secondary | ICD-10-CM

## 2015-05-10 DIAGNOSIS — F341 Dysthymic disorder: Secondary | ICD-10-CM

## 2015-05-10 DIAGNOSIS — F4312 Post-traumatic stress disorder, chronic: Secondary | ICD-10-CM

## 2015-05-10 DIAGNOSIS — F418 Other specified anxiety disorders: Secondary | ICD-10-CM
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
## 2015-05-25 ENCOUNTER — Encounter (HOSPITAL_COMMUNITY): Payer: Self-pay | Admitting: Psychiatry

## 2015-05-25 ENCOUNTER — Ambulatory Visit (INDEPENDENT_AMBULATORY_CARE_PROVIDER_SITE_OTHER): Payer: No Typology Code available for payment source | Admitting: Psychiatry

## 2015-05-25 VITALS — BP 129/67 | HR 83 | Ht 65.0 in | Wt 311.4 lb

## 2015-05-25 DIAGNOSIS — F4312 Post-traumatic stress disorder, chronic: Secondary | ICD-10-CM

## 2015-05-25 MED ORDER — GABAPENTIN 100 MG PO CAPS: 100.0000 mg | ORAL_CAPSULE | Freq: Three times a day (TID) | ORAL | Status: DC
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
## 2015-06-09 ENCOUNTER — Encounter: Payer: Self-pay | Admitting: Internal Medicine

## 2015-06-09 ENCOUNTER — Ambulatory Visit (INDEPENDENT_AMBULATORY_CARE_PROVIDER_SITE_OTHER): Payer: Medicare Other | Admitting: Internal Medicine

## 2015-06-09 VITALS — BP 124/70 | HR 55 | Ht 65.0 in | Wt 318.0 lb

## 2015-06-09 DIAGNOSIS — Z23 Encounter for immunization: Secondary | ICD-10-CM | POA: Diagnosis not present

## 2015-06-09 DIAGNOSIS — J4531 Mild persistent asthma with (acute) exacerbation: Secondary | ICD-10-CM | POA: Diagnosis not present

## 2015-06-09 DIAGNOSIS — G4733 Obstructive sleep apnea (adult) (pediatric): Secondary | ICD-10-CM

## 2015-06-09 MED ORDER — ALBUTEROL SULFATE (2.5 MG/3ML) 0.083% IN NEBU: 2.5000 mg | INHALATION_SOLUTION | Freq: Four times a day (QID) | RESPIRATORY_TRACT | Status: AC | PRN

## 2015-06-09 MED ORDER — ALBUTEROL SULFATE HFA 108 (90 BASE) MCG/ACT IN AERS: 2.0000 | INHALATION_SPRAY | RESPIRATORY_TRACT | Status: DC | PRN
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
## 2015-06-09 NOTE — Patient Instructions (Addendum)
   Refills for your rescue inhaler and nebulizer solution sent to your drug store  Pneumovax 23 pneumonia vaccine against Streptococcus pneumoniae bacteria

## 2015-06-11 ENCOUNTER — Telehealth: Payer: Self-pay | Admitting: Internal Medicine

## 2015-06-11 DIAGNOSIS — G4733 Obstructive sleep apnea (adult) (pediatric): Secondary | ICD-10-CM

## 2015-06-11 DIAGNOSIS — J45909 Unspecified asthma, uncomplicated: Secondary | ICD-10-CM

## 2015-06-11 NOTE — Telephone Encounter (Signed)
Called and spoke with pt Pt states that she has changed DME to [facility] and needs rx sent for refill on CPAP supplies and a mouth piece  Dr Annamaria Boots, are you ok with refilling pt's CPAP supplies and order pt a mouthpiece?  Please advise. Thanks   Allergies  Allergen Reactions  . Advair Diskus [Fluticasone-Salmeterol] Other (See Comments)    Blood in urine  . Escitalopram Anaphylaxis  . Jasmine Fragrance Anaphylaxis  . Breo Ellipta [Fluticasone Furoate-Vilanterol] Other (See Comments)    Swelling of the abdomin.   . Diclofenac Other (See Comments)    unknown  . Lactase Other (See Comments)    unknown  . Molds & Smuts Other (See Comments)    unknown  . Peanuts [Peanut Oil]   . Pravastatin Other (See Comments)    unknown  . Latex Rash  . Pork-Derived Products Rash  . Sulfa Antibiotics Rash    Current Outpatient Prescriptions on File Prior to Visit  Medication Sig Dispense Refill  . albuterol (PROVENTIL HFA;VENTOLIN HFA) 108 (90 BASE) MCG/ACT inhaler Inhale 2 puffs into the lungs every 4 (four) hours as needed for wheezing. 1 Inhaler prn  . albuterol (PROVENTIL) (2.5 MG/3ML) 0.083% nebulizer solution Take 3 mLs (2.5 mg total) by nebulization every 6 (six) hours as needed for wheezing. 75 mL prn  . Cholecalciferol (VITAMIN D PO) Take 1 tablet by mouth daily.    Marland Kitchen gabapentin (NEURONTIN) 100 MG capsule Take 1 capsule (100 mg total) by mouth 3 (three) times daily. Taking 1 Tablets in AM, 1 Tablets at [facility] and 2 Tablets at Bedtime 120 capsule 2  . levothyroxine (SYNTHROID, LEVOTHROID) 100 MCG tablet Take 100 mcg by mouth every morning.     Marland Kitchen MAGNESIUM CITRATE PO Take 300 mg by mouth at bedtime.    . Melatonin 10 MG CAPS Take 20 mg by mouth at bedtime.     . Omega 3 1000 MG CAPS Take 4,000 mg by mouth daily.    Marland Kitchen OVER THE COUNTER MEDICATION Take 1 capsule by mouth daily. Formula one herb blend for colon health    . Pyridoxine HCl (VITAMIN B-6) 250 MG tablet Take 250 mg by mouth  daily.    . Wheat Dextrin (BENEFIBER PO) Take 60 g by mouth daily.     No current facility-administered medications on file prior to visit.

## 2015-06-11 NOTE — Telephone Encounter (Signed)
lmtcb

## 2015-06-11 NOTE — Telephone Encounter (Signed)
Ok to order - [employer]- continuation of CPAP 11, mask of choice, humidifier, supplies     Dx OSA

## 2015-06-11 NOTE — Telephone Encounter (Signed)
Patient returned the call, she may be reached at [phone]

## 2015-06-11 NOTE — Telephone Encounter (Signed)
Order entered for CPAP supplies and nebulizer mouthpiece.  Patient notified that order has been put in. Nothing further needed. Closing encounter

## 2015-06-13 NOTE — Assessment & Plan Note (Signed)
CPAP 11/[facility] CPAP use seems good and effective. Weight loss would help as discussed

## 2015-06-13 NOTE — Assessment & Plan Note (Signed)
Current exacerbation she attributes to season and associates with falling leaves. She is currently using a rescue inhaler or her nebulizer once or sometimes twice daily. She refuses maintenance controllers with steroids although I think her previous problem was with systemic steroids causing weight gain. Plan-medication education done. Pneumonia vaccine 23

## 2015-06-30 ENCOUNTER — Telehealth (HOSPITAL_COMMUNITY): Payer: Self-pay | Admitting: *Deleted

## 2015-06-30 ENCOUNTER — Encounter (HOSPITAL_COMMUNITY): Payer: Self-pay | Admitting: Psychiatry

## 2015-06-30 ENCOUNTER — Ambulatory Visit (INDEPENDENT_AMBULATORY_CARE_PROVIDER_SITE_OTHER): Payer: Medicaid Other | Admitting: Psychiatry

## 2015-06-30 VITALS — Ht 65.0 in | Wt 325.0 lb

## 2015-06-30 DIAGNOSIS — F4312 Post-traumatic stress disorder, chronic: Secondary | ICD-10-CM

## 2015-06-30 MED ORDER — GABAPENTIN 100 MG PO CAPS: 100.0000 mg | ORAL_CAPSULE | Freq: Three times a day (TID) | ORAL | Status: DC

## 2015-06-30 NOTE — Telephone Encounter (Signed)
Pt pharmacy called and lm for office to call them back. Called pt pharmacy and spoke with [place]. Per Kenney Houseman, they are confused on the SIG for pt Gabapentin. Informed them that per pt chart, Dr. Harrington Challenger stated for pt to continue with 4 times a day and Tonya showed understanding.
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
## 2015-07-30 ENCOUNTER — Telehealth: Payer: Self-pay | Admitting: Internal Medicine

## 2015-07-30 DIAGNOSIS — G4733 Obstructive sleep apnea (adult) (pediatric): Secondary | ICD-10-CM

## 2015-07-30 NOTE — Telephone Encounter (Signed)
Spoke with pt. She needs new CPAP supplies. Order has been placed. Nothing further was needed.

## 2015-08-04 ENCOUNTER — Telehealth: Payer: Self-pay | Admitting: Internal Medicine

## 2015-08-04 NOTE — Telephone Encounter (Signed)
Spoke with pt, states she tried to pick up her nebulizer mouthpiece that was ordered on 06/11/15 per our records, but the pharmacy states that they have no rx for this. Called the pharmacy, was told that they do not in fact have an order for the mouthpiece, despite a documented confirmation fax that the order was received. Order needs to be refaxed to [facility] at  [phone] attn: [place]  This has been refaxed.  Nothing further needed.

## 2015-09-01 ENCOUNTER — Encounter: Payer: Self-pay | Admitting: Internal Medicine

## 2015-09-01 NOTE — Telephone Encounter (Signed)
FYI for Dr Young 

## 2015-09-29 ENCOUNTER — Ambulatory Visit (INDEPENDENT_AMBULATORY_CARE_PROVIDER_SITE_OTHER): Payer: Medicare Other | Admitting: Psychiatry

## 2015-09-29 ENCOUNTER — Encounter (HOSPITAL_COMMUNITY): Payer: Self-pay | Admitting: Psychiatry

## 2015-09-29 VITALS — BP 135/65 | HR 67 | Ht 65.0 in | Wt 323.8 lb

## 2015-09-29 DIAGNOSIS — F4312 Post-traumatic stress disorder, chronic: Secondary | ICD-10-CM

## 2015-09-29 MED ORDER — GABAPENTIN 100 MG PO CAPS: ORAL_CAPSULE | ORAL | Status: DC
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
## 2015-09-30 ENCOUNTER — Ambulatory Visit (HOSPITAL_COMMUNITY): Payer: Self-pay | Admitting: Psychiatry

## 2015-10-01 ENCOUNTER — Ambulatory Visit (HOSPITAL_COMMUNITY): Payer: Self-pay | Admitting: Psychology

## 2015-11-25 ENCOUNTER — Telehealth: Payer: Self-pay | Admitting: Internal Medicine

## 2015-11-25 NOTE — Telephone Encounter (Signed)
Spoke with pt. She is having issues with her CPAP mask. Her current mask is leaking a lot. She had to change masks back last year due to her insurance requirements. The original mask that she was using worked great but now her insurance requires a $50 charge to supply her with this mask. She will be buying this mask on Ebay so she will have a mask that works well. The reason for her call was wanting to know if she needed to keep her appointment with CY on 12/07/15. Advised her that it would be best if she kept that appointment so if her CPAP machine broke her insurance would cover this. She will keep her CY appointment. Nothing further was needed at this time.

## 2015-12-07 ENCOUNTER — Encounter: Payer: Self-pay | Admitting: Internal Medicine

## 2015-12-07 ENCOUNTER — Ambulatory Visit (INDEPENDENT_AMBULATORY_CARE_PROVIDER_SITE_OTHER): Payer: Medicare Other | Admitting: Internal Medicine

## 2015-12-07 VITALS — BP 140/70 | HR 75 | Ht 65.0 in | Wt 329.6 lb

## 2015-12-07 DIAGNOSIS — J4521 Mild intermittent asthma with (acute) exacerbation: Secondary | ICD-10-CM | POA: Diagnosis not present

## 2015-12-07 DIAGNOSIS — G4733 Obstructive sleep apnea (adult) (pediatric): Secondary | ICD-10-CM

## 2015-12-07 NOTE — Patient Instructions (Signed)
CPAP 11 seems to do well for you. Hope you can continue to use it any time you are asleep  Medications seem right for now  Please call if we can help

## 2015-12-07 NOTE — Assessment & Plan Note (Signed)
Acute episode described this sudden throat closure after exposed to grasping mode through a window she just opened. Her description really suggests a laryngospasm/panic event Brother than angioedema of the throat with bronchospasm. This possibility discussed but no proof at this time. Her triggers tend to be irritants such as strong odors and temperature changes.

## 2015-12-07 NOTE — Assessment & Plan Note (Signed)
Needs to improve CPAP compliance and lose weight as discussed. CPAP goals reviewed. Pressure 11 gives good control when used.
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
## 2015-12-15 ENCOUNTER — Encounter: Payer: Self-pay | Admitting: Internal Medicine

## 2015-12-28 ENCOUNTER — Ambulatory Visit (HOSPITAL_COMMUNITY): Payer: Self-pay | Admitting: Psychiatry

## 2016-01-05 ENCOUNTER — Ambulatory Visit (HOSPITAL_COMMUNITY): Payer: Self-pay | Admitting: Psychiatry

## 2016-01-07 ENCOUNTER — Ambulatory Visit (HOSPITAL_COMMUNITY): Payer: Self-pay | Admitting: Psychology

## 2016-01-13 ENCOUNTER — Telehealth: Payer: Self-pay | Admitting: Internal Medicine

## 2016-01-13 DIAGNOSIS — G4733 Obstructive sleep apnea (adult) (pediatric): Secondary | ICD-10-CM

## 2016-01-13 NOTE — Telephone Encounter (Signed)
Spoke with pt and she states that she has moved back to [place] and would like to change DME's back to [employer]. Order placed for DME change and for CPAP mask and supplies. Nothing further needed.

## 2016-01-25 ENCOUNTER — Ambulatory Visit (INDEPENDENT_AMBULATORY_CARE_PROVIDER_SITE_OTHER): Payer: Medicare Other | Admitting: Psychiatry

## 2016-01-25 ENCOUNTER — Encounter (HOSPITAL_COMMUNITY): Payer: Self-pay | Admitting: Psychiatry

## 2016-01-25 VITALS — BP 153/81 | HR 63 | Ht 65.0 in | Wt 334.4 lb

## 2016-01-25 DIAGNOSIS — F4312 Post-traumatic stress disorder, chronic: Secondary | ICD-10-CM | POA: Diagnosis not present

## 2016-01-25 MED ORDER — GABAPENTIN 100 MG PO CAPS: ORAL_CAPSULE | ORAL | Status: DC
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
## 2016-01-26 ENCOUNTER — Encounter (HOSPITAL_COMMUNITY): Payer: Self-pay | Admitting: Psychology

## 2016-01-26 ENCOUNTER — Telehealth (HOSPITAL_COMMUNITY): Payer: Self-pay | Admitting: *Deleted

## 2016-01-26 ENCOUNTER — Ambulatory Visit (INDEPENDENT_AMBULATORY_CARE_PROVIDER_SITE_OTHER): Payer: Medicare Other | Admitting: Psychology

## 2016-01-26 DIAGNOSIS — F341 Dysthymic disorder: Secondary | ICD-10-CM | POA: Diagnosis not present

## 2016-01-26 DIAGNOSIS — F5105 Insomnia due to other mental disorder: Secondary | ICD-10-CM

## 2016-01-26 DIAGNOSIS — F331 Major depressive disorder, recurrent, moderate: Secondary | ICD-10-CM | POA: Diagnosis not present

## 2016-01-26 DIAGNOSIS — F4312 Post-traumatic stress disorder, chronic: Secondary | ICD-10-CM | POA: Diagnosis not present

## 2016-01-26 DIAGNOSIS — F418 Other specified anxiety disorders: Secondary | ICD-10-CM

## 2016-01-26 NOTE — Telephone Encounter (Signed)
Pt came into office to provide the names of the medications she was allergic to. Per pt, she took Furosemide 20 mg and Klor-Con 10 meq and she swell up and no longer took the.
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
## 2016-05-09 ENCOUNTER — Other Ambulatory Visit (HOSPITAL_COMMUNITY): Payer: Self-pay | Admitting: Internal Medicine

## 2016-05-09 DIAGNOSIS — Z1231 Encounter for screening mammogram for malignant neoplasm of breast: Secondary | ICD-10-CM

## 2016-05-22 ENCOUNTER — Ambulatory Visit (HOSPITAL_COMMUNITY)
Admission: RE | Admit: 2016-05-22 | Discharge: 2016-05-22 | Disposition: A | Discharge status: Home / Self Care | Payer: Medicare Other | Source: Ambulatory Visit | Attending: Internal Medicine | Admitting: Internal Medicine

## 2016-05-22 DIAGNOSIS — Z1231 Encounter for screening mammogram for malignant neoplasm of breast: Secondary | ICD-10-CM | POA: Diagnosis present

## 2016-05-23 ENCOUNTER — Ambulatory Visit (HOSPITAL_COMMUNITY): Payer: Self-pay | Admitting: Psychology

## 2016-05-24 ENCOUNTER — Ambulatory Visit (HOSPITAL_COMMUNITY): Payer: Self-pay | Admitting: Psychiatry

## 2016-05-25 ENCOUNTER — Telehealth: Payer: Self-pay | Admitting: Internal Medicine

## 2016-05-25 NOTE — Telephone Encounter (Signed)
Spoke with pt. She had a question about being scheduled for a PFT. After talking for a few minutes, she decided to have her PCP order this test for her. Advised her that if she changed her mind to just let us know. Nothing further was needed.

## 2016-05-31 ENCOUNTER — Telehealth: Payer: Self-pay | Admitting: Internal Medicine

## 2016-05-31 DIAGNOSIS — J45909 Unspecified asthma, uncomplicated: Secondary | ICD-10-CM

## 2016-05-31 DIAGNOSIS — G4733 Obstructive sleep apnea (adult) (pediatric): Secondary | ICD-10-CM

## 2016-05-31 NOTE — Telephone Encounter (Signed)
Spoke with the pt She is requesting new neb machine order to be sent to [facility] b/c her current machine is not working well  I have sent order to [facility] for this  She also states her RT advised her to ask about auto cpap setting, due to the fact that she wakes up with HA daily and feels like she is not getting enough air on her current fixed setting  Please advise thanks!

## 2016-06-01 NOTE — Telephone Encounter (Signed)
Order has been placed for the cpap machine to be adjusted per pts request. Nothing further is needed.

## 2016-06-01 NOTE — Telephone Encounter (Signed)
Order- DME [facility] change CPAP setting to auto 5-15, continue mask of choice, humidifier, supplies, AirView    Dx OSA

## 2016-07-23 IMAGING — CR DG CHEST 2V
2 series · 2 of 2 positions shown · non-contrast
Comparison: PA and lateral chest of March 15, 2013

CLINICAL DATA: Neck and shoulder pain

EXAM:
CHEST  2 VIEW

[view not recorded (1 of 2)]
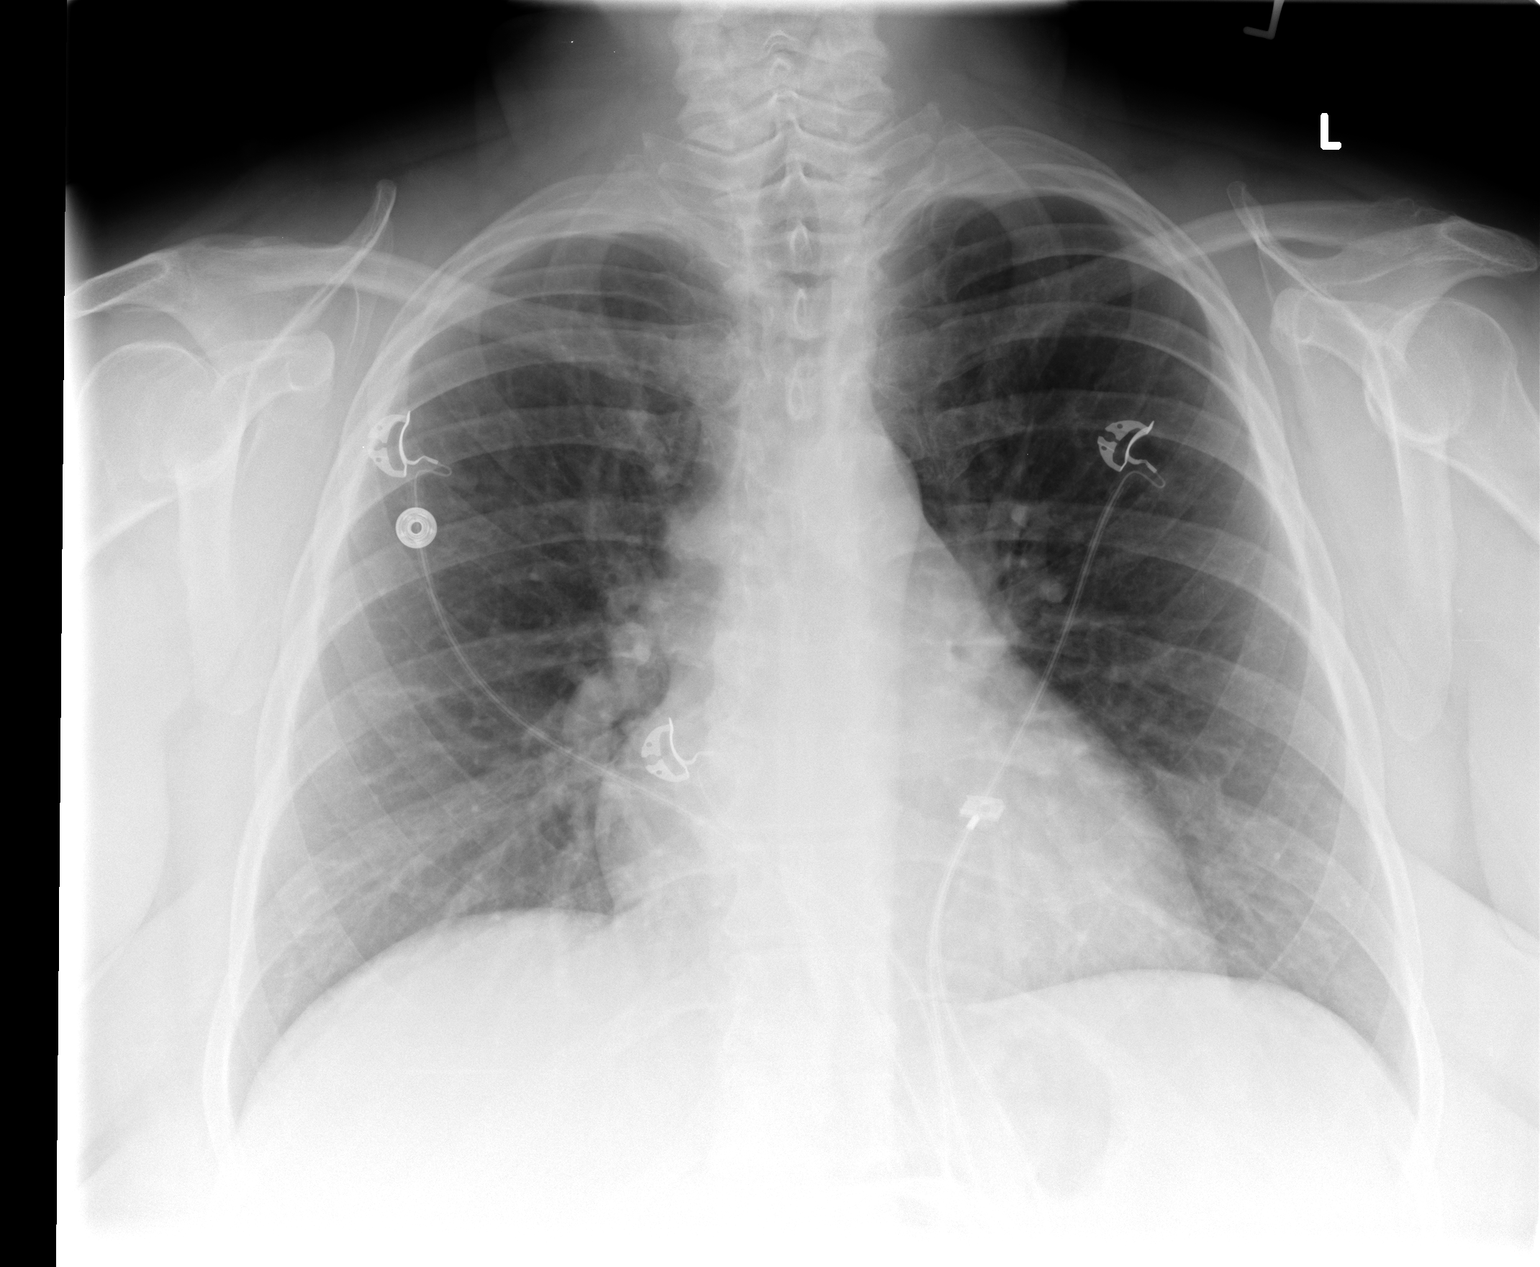

[view not recorded (2 of 2)]
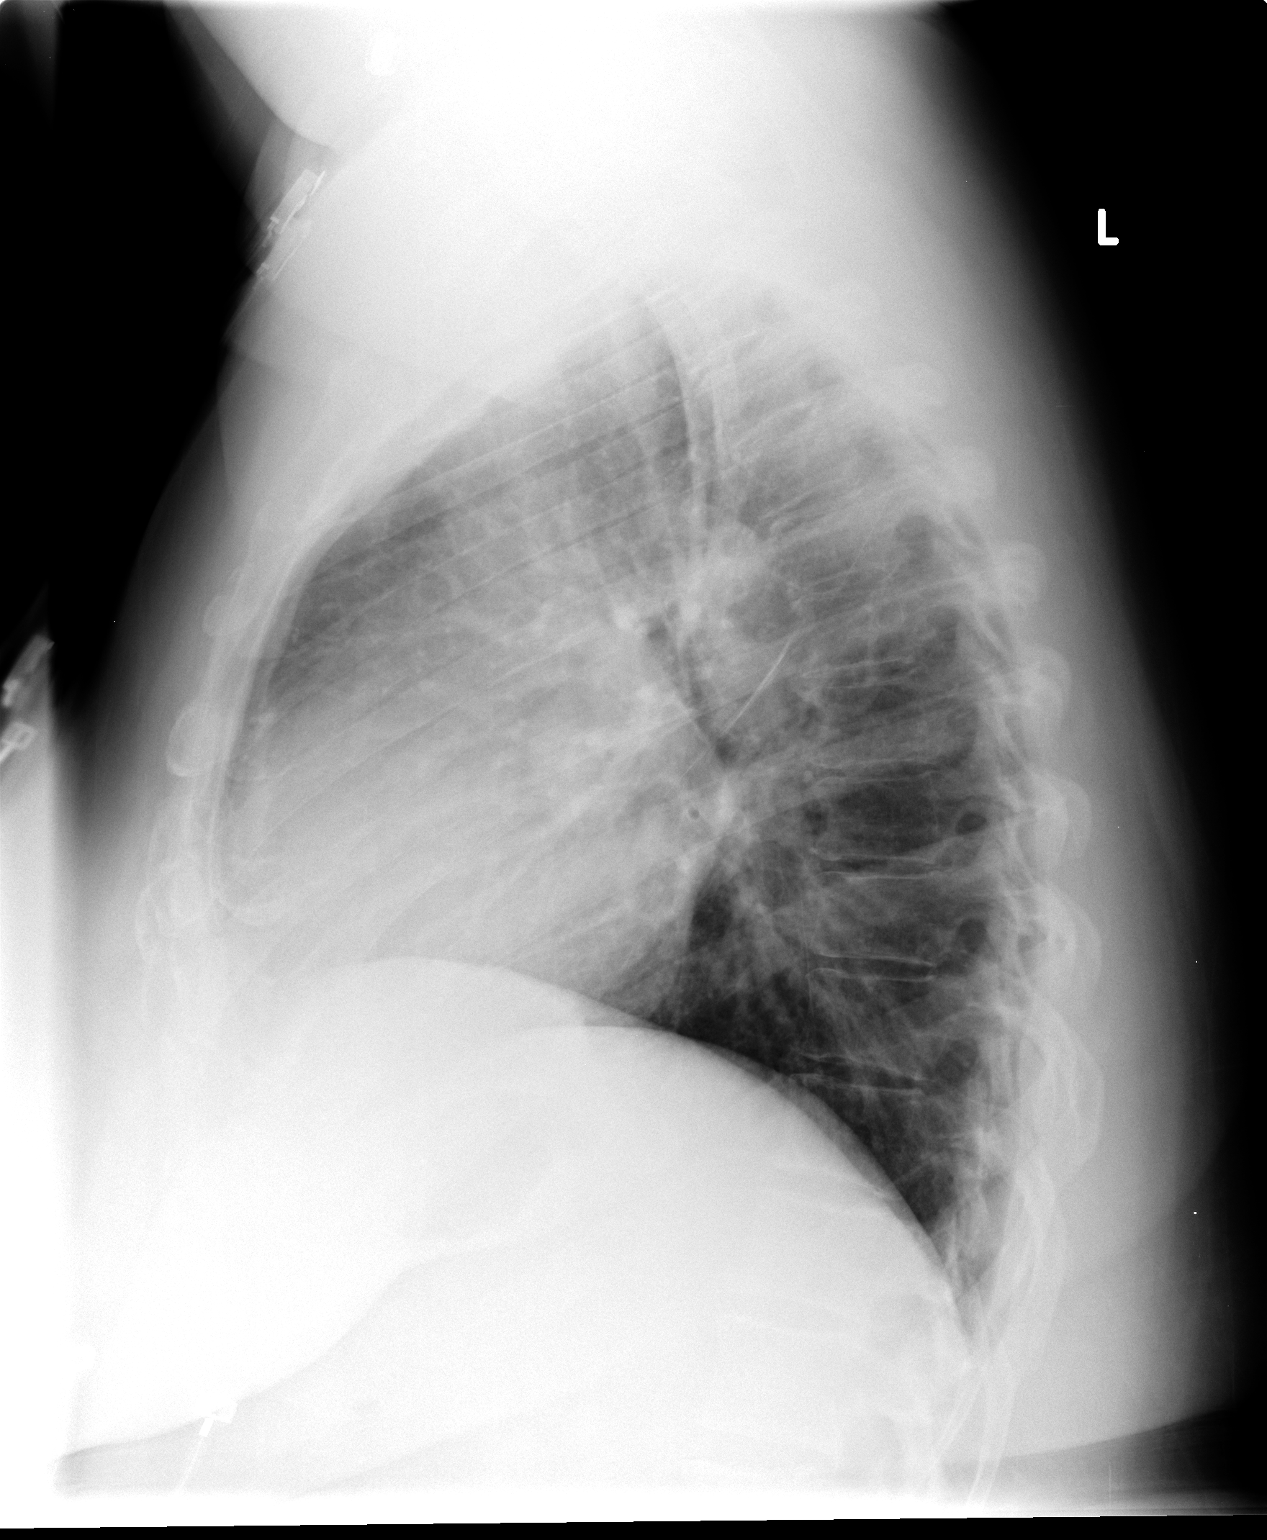

[2 of 2 positions shown; findings below may reference images not displayed]

FINDINGS: The lungs are well-expanded and clear. The heart is top-normal in
size. The pulmonary vascularity is not engorged. There is no pleural
effusion. The mediastinum is normal in width. The bony thorax is
unremarkable.
IMPRESSION: There is no acute cardiopulmonary abnormality.

## 2016-11-21 ENCOUNTER — Ambulatory Visit (HOSPITAL_COMMUNITY)
Admission: RE | Admit: 2016-11-21 | Discharge: 2016-11-21 | Disposition: A | Discharge status: Home / Self Care | Payer: Medicare Other | Source: Ambulatory Visit | Attending: Internal Medicine | Admitting: Internal Medicine

## 2016-11-21 ENCOUNTER — Other Ambulatory Visit (HOSPITAL_COMMUNITY): Payer: Self-pay | Admitting: Internal Medicine

## 2016-11-21 DIAGNOSIS — M25552 Pain in left hip: Secondary | ICD-10-CM | POA: Insufficient documentation

## 2016-11-21 DIAGNOSIS — M1612 Unilateral primary osteoarthritis, left hip: Secondary | ICD-10-CM | POA: Insufficient documentation

## 2016-12-06 ENCOUNTER — Encounter: Payer: Self-pay | Admitting: Internal Medicine

## 2016-12-06 ENCOUNTER — Ambulatory Visit (INDEPENDENT_AMBULATORY_CARE_PROVIDER_SITE_OTHER): Payer: Medicare Other | Admitting: Internal Medicine

## 2016-12-06 VITALS — BP 136/72 | HR 84 | Ht 65.0 in | Wt 322.0 lb

## 2016-12-06 DIAGNOSIS — J3089 Other allergic rhinitis: Secondary | ICD-10-CM | POA: Diagnosis not present

## 2016-12-06 DIAGNOSIS — G540 Brachial plexus disorders: Secondary | ICD-10-CM

## 2016-12-06 DIAGNOSIS — J302 Other seasonal allergic rhinitis: Secondary | ICD-10-CM

## 2016-12-06 DIAGNOSIS — G4733 Obstructive sleep apnea (adult) (pediatric): Secondary | ICD-10-CM | POA: Diagnosis not present

## 2016-12-06 MED ORDER — ALBUTEROL SULFATE HFA 108 (90 BASE) MCG/ACT IN AERS: 2.0000 | INHALATION_SPRAY | RESPIRATORY_TRACT | 99 refills | Status: DC | PRN

## 2016-12-06 NOTE — Assessment & Plan Note (Signed)
She has learned to sleep with right arm extended to the shoulder to control pulsatile arterial noise from impingement.

## 2016-12-06 NOTE — Assessment & Plan Note (Signed)
Rhinitis symptoms. Controlled with Benadryl/Claritin. She can and Flonase if needed.

## 2016-12-06 NOTE — Patient Instructions (Signed)
Order- DME Catherine Turner continue CPAP 11-15, mask of choice, humidifier, supplies, AirView  Dx OSA  Script sent refilling albuterol rescue inhaler  Please call as needed

## 2016-12-06 NOTE — Assessment & Plan Note (Signed)
Patient reports an download both indicate excellent compliance and control. She feels better using CPAP. We will renew her order for supplies but she does not need pressure change at this visit. Weight loss is advised.

## 2016-12-06 NOTE — Assessment & Plan Note (Signed)
She remains severely overweight. Her PCP might be able to make bariatric referral.

## 2016-12-06 NOTE — Progress Notes (Signed)
HPI female never smoker followed for OSA, asthma/bronchitis, complicated by GERD, allergic rhinitis, anxiety, Thoracic Outlet Syndrome NPSG 04/20/13- AHI 10/ hr. CPAP titration 06/10/13> 11 cwp/ hr w/ F/P Simplus mask. Epworth6/24, BMI 53. Office spirometry 04/07/14- WNL- FVC 2.84/86%, FEV1 2.32/88%, FEV1/FVC 0.82, FEF 25-75% 2.48/94%   --------------------------------------------------------------------------------------------------  CPAP 11 / [employer] back to [place] and she will change DME then to [employer]. CPAP download shows need to improve compliance but she says she stopped using CPAP while she was coughing badly and has now resumed regular use. Describes episode "severe anaphylaxis thing"-she opened a window while lawn was being mowed outside. At first with Advair she says her throat closed immediately and she was frightened. Forgot she had EpiPen until next day. Took Benadryl, nebulizer treatment and benzonatate. Did not go to ER.  51/7- 61 year old female never smoker followed for OSA, asthma/bronchitis, complicated by GERD, allergic rhinitis, anxiety, Thoracic Outlet Syndrome CPAP auto 5-15/ [facility] FO[facility] UP D[facility] IS [facility], NEEDS A NEW A NEW RX for Ventolin, DL attached. Pt needs new supplies as well. Weight today 322 pounds 43 months is living in a friend's home while her own apartment is refurbished. The friend has multiple animals including dogs, birds and lizards. So far there has not been significant asthma flare. She is going to physical therapy 3 days a week and uses her nebulizer as pretreatment before each visit. Infrequent need for rescue inhaler and not waking at night with wheezing. No major exacerbations. Added Benadryl at night/Claritin in the morning for mild spring rhinitis. Download confirms her report of excellent CPAP compliance 100%/4 hours, AHI 0.6/hour. She sleeps much better with CPAP and intends to continue.  ROS-see HPI Constitutional:   No-    weight loss, night sweats, fevers, chills, fatigue, lassitude. HEENT:   + headaches, +difficulty swallowing, +tooth/dental problems, sore throat,       No-  sneezing, itching, ear ache, +nasal congestion, +post nasal drip,  CV:  No-   chest pain, orthopnea, PND, swelling in lower extremities, anasarca, dizziness, +palpitations Resp: + shortness of breath with exertion or at rest.              No-productive cough,  + non-productive cough,  No- coughing up of blood.              No-   change in color of mucus.  + wheezing.   Skin: no-rash  GI: + heartburn, indigestion, No- abdominal pain, nausea, vomiting,  GU:  MS:  + joint pain or swelling.  . Neuro-    Episodes of? Word agnosia Psych:  No- change in mood or affect. + depression or +anxiety.  No memory loss.  OBJ- Physical Exam General- Alert, Oriented, Affect-appropriate, Distress- none acute. +Obese Skin- rash-none, lesions- none, excoriation- none Lymphadenopathy- none Head- atraumatic            Eyes- Gross vision intact, PERRLA, conjunctivae and secretions clear            Ears- Hearing, canals-normal            Nose- Clear, no-Septal dev, mucus, polyps, erosion, perforation             Throat- Mallampati III , mucosa clear , drainage- none, tonsils+ present Neck- flexible , trachea midline, no stridor , thyroid nl, carotid- no bruit Chest - symmetrical excursion , unlabored           Heart/CV- RRR , no murmur , no gallop  , no rub, nl  s1 s2                           - JVD- none , edema- none, stasis changes- none, varices- none           Lung- clear to P&A, wheeze -none cough- none , dullness-none, rub- none,            Chest wall-  Abd-  Br/ Gen/ Rectal- Not done, not indicated Extrem- cyanosis- none, clubbing, none, atrophy- none, strength- nl Neuro- grossly intact to observation, speech is articulate

## 2016-12-27 ENCOUNTER — Encounter (HOSPITAL_COMMUNITY): Payer: Self-pay | Admitting: *Deleted

## 2016-12-27 ENCOUNTER — Emergency Department (HOSPITAL_COMMUNITY)
Admission: EM | Admit: 2016-12-27 | Discharge: 2016-12-27 | Disposition: A | Discharge status: Home / Self Care | Payer: Medicare Other | Attending: Emergency Medicine | Admitting: Emergency Medicine

## 2016-12-27 DIAGNOSIS — M791 Myalgia, unspecified site: Secondary | ICD-10-CM

## 2016-12-27 DIAGNOSIS — M2550 Pain in unspecified joint: Secondary | ICD-10-CM | POA: Diagnosis present

## 2016-12-27 DIAGNOSIS — J45909 Unspecified asthma, uncomplicated: Secondary | ICD-10-CM | POA: Diagnosis not present

## 2016-12-27 DIAGNOSIS — Z7722 Contact with and (suspected) exposure to environmental tobacco smoke (acute) (chronic): Secondary | ICD-10-CM | POA: Diagnosis not present

## 2016-12-27 DIAGNOSIS — Z79899 Other long term (current) drug therapy: Secondary | ICD-10-CM | POA: Diagnosis not present

## 2016-12-27 DIAGNOSIS — E039 Hypothyroidism, unspecified: Secondary | ICD-10-CM | POA: Insufficient documentation

## 2016-12-27 MED ORDER — ONDANSETRON 8 MG PO TBDP
8.0000 mg | ORAL_TABLET | Freq: Once | ORAL | Status: AC
  Administered 2016-12-27: 8 mg via ORAL
  Filled 2016-12-27: qty 1

## 2016-12-27 MED ORDER — TRAMADOL HCL 50 MG PO TABS: 50.0000 mg | ORAL_TABLET | Freq: Four times a day (QID) | ORAL | 0 refills | Status: DC | PRN

## 2016-12-27 MED ORDER — OXYCODONE-ACETAMINOPHEN 5-325 MG PO TABS
1.0000 | ORAL_TABLET | Freq: Once | ORAL | Status: AC
  Administered 2016-12-27: 1 via ORAL
  Filled 2016-12-27: qty 1

## 2016-12-27 NOTE — ED Provider Notes (Signed)
[facility] DEPT Provider Note   CSN: [id number] Arrival date & time: 12/27/16  1112     History   Chief Complaint Chief Complaint  Patient presents with  . Joint Pain    HPI Catherine Turner is a 61 y.o. female.  HPI  ROSHANA Catherine Turner is a 61 y.o. female with hx of chronic left hip pain, who presents to the Emergency Department complaining of aching pain to multiple joints. She describes having "arthritis" to her knees, fingers, and hips.  She states that she has been having pain to these joints after excessive walking or use for two years.  She complains of increased pain for one day after she walked excessively while shopping.  She denies fall, redness , swelling, fever, recent tick bite, and rash.  Has appt with PCP in one day.     Past Medical History:  Diagnosis Date  . Arm fracture    right arm   . Asthma   . Bulging lumbar disc   . Complication of anesthesia    pt sts, "I am a very shallow breather and they have to do something special for me"/  . COPD (chronic obstructive pulmonary disease) ([place])   . Degenerative joint disease (DJD) of hip   . DJD (degenerative joint disease) of hip   . Elevated LFTs   . Hip pain   . Hypothyroidism   . Insomnia   . Nerve damage    right arm.  Marland Kitchen PTSD (post-traumatic stress disorder)   . Sleep apnea    has CPAP  . Thyroid disease     Patient Active Problem List   Diagnosis Date Noted  . Obesity, morbid ([place]) 12/06/2016  . Asthma with bronchitis 09/21/2014  . History of colonic polyps   . Diverticulosis of colon without hemorrhage   . Thoracic outlet syndrome 05/07/2014  . Dyspnea 04/13/2014  . Obstructive sleep apnea 07/30/2013  . Seasonal and perennial allergic rhinitis 07/30/2013  . PTSD (post-traumatic stress disorder) 01/13/2013  . Anxiety state, unspecified 01/13/2013  . Insomnia secondary to depression with anxiety 01/13/2013  . Arm pain 01/13/2013    Past Surgical History:  Procedure Laterality Date  .  COLONOSCOPY N/A 08/12/2014   Procedure: COLONOSCOPY;  Surgeon: Daneil Dolin, MD;  Location: [facility];  Service: Endoscopy;  Laterality: N/A;  115  . thumb surgery    . UMBILICAL HERNIA REPAIR N/A 12/30/2014   Procedure: HERNIA REPAIR UMBILICAL ADULT WITH MESH;  Surgeon: Aviva Signs Md, MD;  Location: [facility] ORS;  Service: General;  Laterality: N/A;    OB History    No data available       Home Medications    Prior to Admission medications   Medication Sig Start Date End Date Taking? Authorizing Provider  albuterol (PROVENTIL HFA;VENTOLIN HFA) 108 (90 Base) MCG/ACT inhaler Inhale 2 puffs into the lungs every 4 (four) hours as needed for wheezing. 12/06/16   Baird Lyons D, MD  albuterol (PROVENTIL) (2.5 MG/3ML) 0.083% nebulizer solution Take 3 mLs (2.5 mg total) by nebulization every 6 (six) hours as needed for wheezing. 06/09/15   Baird Lyons D, MD  Cholecalciferol (VITAMIN D PO) Take 1 tablet by mouth daily. Reported on 01/25/2016    [provider]  gabapentin (NEURONTIN) 100 MG capsule Taking 1 Tablets in AM, 1 Tablets at [facility] and 2 Tablets at Bedtime Patient not taking: Reported on 12/06/2016 01/25/16   Cloria Spring, MD  levothyroxine (SYNTHROID, LEVOTHROID) 100 MCG tablet Take  112 mcg by mouth every morning.     [provider]  MAGNESIUM CITRATE PO Take 300 mg by mouth at bedtime. Reported on 09/29/2015    [provider]  Melatonin 10 MG CAPS Take 20 mg by mouth at bedtime. Reported on 09/29/2015    [provider]  Omega 3 1000 MG CAPS Take 4,000 mg by mouth daily. Reported on 09/29/2015    [provider]  OVER THE COUNTER MEDICATION Take 1 capsule by mouth daily. Reported on 09/29/2015    [provider]  Pyridoxine HCl (VITAMIN B-6) 250 MG tablet Take 250 mg by mouth daily. Reported on 09/29/2015    [provider]    Family History Family History  Problem Relation Age of Onset  . COPD Mother   . Cancer - Lung  Mother   . Hypertension Father   . Drug abuse Sister   . Heart disease Maternal Grandfather   . Heart disease Maternal Grandmother   . Heart disease Paternal Grandfather   . Heart attack Paternal Grandfather   . Heart disease Paternal Grandmother   . Physical abuse Other   . Anxiety disorder Other   . Birth defects Other        Catherine Turner  . Dementia Maternal Aunt   . Alcohol abuse Paternal Aunt   . Alcohol abuse Paternal Uncle   . ADD / ADHD Neg Hx   . Bipolar disorder Neg Hx   . OCD Neg Hx   . Paranoid behavior Neg Hx   . Schizophrenia Neg Hx   . Seizures Neg Hx   . Sexual abuse Neg Hx     Social History Social History  Substance Use Topics  . Smoking status: Passive Smoke Exposure - Never Smoker  . Smokeless tobacco: Never Used  . Alcohol use No     Comment: 4 oz wine     Allergies   Advair diskus [fluticasone-salmeterol]; Escitalopram; Jasmine fragrance; Breo ellipta [fluticasone furoate-vilanterol]; Diclofenac; Furosemide; Lactase; Molds & smuts; Peanuts [peanut oil]; Potassium-containing compounds; Pravastatin; Latex; Pork-derived products; and Sulfa antibiotics   Review of Systems Review of Systems  Constitutional: Negative for chills and fever.  Gastrointestinal: Negative for abdominal pain, nausea and vomiting.  Genitourinary: Negative for difficulty urinating and dysuria.  Musculoskeletal: Positive for arthralgias (multiple joint pain). Negative for joint swelling.  Skin: Negative for color change and wound.  All other systems reviewed and are negative.    Physical Exam Updated Vital Signs BP 136/70   Pulse 71   Temp 98 F (36.7 C) (Oral)   Ht 5\' 5"  (1.651 m)   Wt (!) 146.1 kg (322 lb)   SpO2 100%   BMI 53.58 kg/m   Physical Exam  Constitutional: She is oriented to person, place, and time. She appears well-developed and well-nourished. No distress.  HENT:  Head: Atraumatic.  Mouth/Throat: Oropharynx is clear and moist.  Neck: No JVD  present.  Cardiovascular: Normal rate, regular rhythm and intact distal pulses.   No murmur heard. Pulmonary/Chest: Effort normal and breath sounds normal. No respiratory distress.  Musculoskeletal: Normal range of motion. She exhibits tenderness. She exhibits no edema.  ttp of the bilateral knees, lateral left hip and PIP and DIP joints of the fingers of bilateral hands.  No erythema, edema.  No motor deficits.    Neurological: She is alert and oriented to person, place, and time. No sensory deficit.  Skin: Skin is warm. Capillary refill takes less than 2 seconds. No rash  noted. No erythema.  Nursing note and vitals reviewed.    ED Treatments / Results  Labs (all labs ordered are listed, but only abnormal results are displayed) Labs Reviewed - No data to display  EKG  EKG Interpretation None       Radiology No results found.  Procedures Procedures (including critical care time)  Medications Ordered in ED Medications  ondansetron (ZOFRAN-ODT) disintegrating tablet 8 mg (not administered)  oxyCODONE-acetaminophen (PERCOCET/ROXICET) 5-325 MG per tablet 1 tablet (not administered)     Initial Impression / Assessment and Plan / ED Course  I have reviewed the triage vital signs and the nursing notes.  Pertinent labs & imaging results that were available during my care of the patient were reviewed by me and considered in my medical decision making (see chart for details).     Pt has appt with her PCP for tomorrow.  She is well appearing.  Vitals stable.  No concerning sx's for emergent process.   The patient appears reasonably screened and/or stabilized for discharge and I doubt any other medical condition or other [facility] requiring further screening, evaluation, or treatment in the ED at this time prior to discharge.   Final Clinical Impressions(s) / ED Diagnoses   Final diagnoses:  Myalgia  Multiple joint pain    New Prescriptions New Prescriptions   No medications  on file     Kem Parkinson, Hershal Coria 12/29/16 [id number]    Francine Graven, DO 12/30/16 1451

## 2016-12-27 NOTE — Discharge Instructions (Signed)
Be sure to keep your appt with Dr. Woody Seller for tomorrow.  Try applying ice packs on/off to your neck and hip

## 2016-12-27 NOTE — ED Triage Notes (Signed)
Pt comes in for joint pain all over. Pt states over the last 2 years she has had pain after doing tasks like shopping and it usually goes away after resting. She went shopping at [facility] Monday and her pain hasn't gone away since then. She is to follow up with PCP tomorrow but couldn't wait because of the pain.

## 2017-02-15 ENCOUNTER — Ambulatory Visit (INDEPENDENT_AMBULATORY_CARE_PROVIDER_SITE_OTHER): Payer: Medicare Other | Admitting: Orthopaedic Surgery

## 2017-02-15 ENCOUNTER — Encounter (INDEPENDENT_AMBULATORY_CARE_PROVIDER_SITE_OTHER): Payer: Self-pay | Admitting: Orthopaedic Surgery

## 2017-02-15 VITALS — BP 120/71 | HR 75 | Ht 65.0 in | Wt 317.0 lb

## 2017-02-15 DIAGNOSIS — M1612 Unilateral primary osteoarthritis, left hip: Secondary | ICD-10-CM

## 2017-02-15 NOTE — Progress Notes (Signed)
Office Visit Note/orthopedic consultation   Patient: Catherine Turner           Date of Birth: [birth year]           MRN: [id number] Visit Date: 02/15/2017              Requested by: Glenda Chroman, MD [place], [place] [address] PCP: Glenda Chroman, MD   Assessment & Plan: Visit Diagnoses:  1. Unilateral primary osteoarthritis, left hip     Plan: She needs to be exercising in the pool to continue to work on weight loss and also unload her degenerative left hip joint. We discussed increased risks with morbid obesity and the lip arthroplasty. Increased risks of falling, infection, wound dehiscence, DVT, pulmonary embolism. To get her BMI down the 40 she be 253 pounds. If she can make some significant progress toward this we can consider proceeding with surgery to aid in her activity program for further weight loss. We had it is sensitive discussion about things she would work on the pool and she has close access to this from where she lives. I plan to recheck her in 3 months. Thank you for the opportunity to see in consultation.  Follow-Up Instructions: Return in about 3 months (around 05/18/2017).   Orders:  No orders of the defined types were placed in this encounter.  No orders of the defined types were placed in this encounter.     Procedures: No procedures performed   Clinical Data: No additional findings.   Subjective: Chief Complaint  Patient presents with  . Left Hip - Pain    HPI 61 year old female with the severe left hip osteoarthritis documented by plain radiographs and 02/01/2017 MRI scan with also small partial tear of the left hamstring origin. She's had pain with weightbearing Cam Hai with a limp. I have 15 pages from Dr.Vyas which is kindly been supplied for review of her past treatment and workup. She has problems with morbid obesity and one point was 465 pounds and is now down to 317 pounds. She been on hydrocodone for the pain as well as Naprosyn. She  states she may have injured her hip when she was on some gym equipment jammed. Back in 2009. X-rays and MRI did not suggest evidence of previous fracture that would suggest this is posttraumatic last osteoarthritis but this looks more like primary hip osteoarthritis. Opposite right hip shows minimal degenerative changes.   Review of Systems hemostasis was positive for thoracic outlet syndrome, asthma, obstructive sleep apnea, morbid obesity, anxiety, left shoulder pain, and PTSD. Previous umbilical hernia surgery also thumb surgery 2016. Positive for depression and history of scarlet fever thyroid condition teeth and gum disease and self-reported osteoporosis.   Objective: Vital Signs: BP 120/71   Pulse 75   Ht 5\' 5"  (1.651 m)   Wt (!) 317 lb (143.8 kg)   BMI 52.75 kg/m   Physical Exam  Constitutional: She is oriented to person, place, and time. She appears well-developed.  HENT:  Head: Normocephalic.  Right Ear: External ear normal.  Left Ear: External ear normal.  Eyes: Pupils are equal, round, and reactive to light.  Neck: No tracheal deviation present. No thyromegaly present.  Cardiovascular: Normal rate.   Pulmonary/Chest: Effort normal.  Abdominal: Soft.  Truncal obesity  Musculoskeletal:  Positive impingement left shoulder. Upper and lower extremity reflexes are 2+ and symmetrical. She has 5 internal rotation left hip with severe groin pain. Some trochanteric bursal tenderness. Initial tuberosity  hamstring origin is mildly tender on the left. Good strong quad strength. Negative peptic compression test. Distal pulses palpable.  Neurological: She is alert and oriented to person, place, and time.  Skin: Skin is warm and dry.  Psychiatric: She has a normal mood and affect. Her behavior is normal.    Ortho Exam  Specialty Comments:  No specialty comments available.  Imaging: No results found.   PMFS History: Patient Active Problem List   Diagnosis Date Noted  .  Obesity, morbid ([place]) 12/06/2016  . Asthma with bronchitis 09/21/2014  . History of colonic polyps   . Diverticulosis of colon without hemorrhage   . Thoracic outlet syndrome 05/07/2014  . Dyspnea 04/13/2014  . Obstructive sleep apnea 07/30/2013  . Seasonal and perennial allergic rhinitis 07/30/2013  . PTSD (post-traumatic stress disorder) 01/13/2013  . Anxiety state, unspecified 01/13/2013  . Insomnia secondary to depression with anxiety 01/13/2013  . Arm pain 01/13/2013   Past Medical History:  Diagnosis Date  . Arm fracture    right arm   . Asthma   . Bulging lumbar disc   . Complication of anesthesia    pt sts, "I am a very shallow breather and they have to do something special for me"/  . COPD (chronic obstructive pulmonary disease) ([place])   . Degenerative joint disease (DJD) of hip   . DJD (degenerative joint disease) of hip   . Elevated LFTs   . Hip pain   . Hypothyroidism   . Insomnia   . Nerve damage    right arm.  Marland Kitchen PTSD (post-traumatic stress disorder)   . Sleep apnea    has CPAP  . Thyroid disease     Family History  Problem Relation Age of Onset  . COPD Mother   . Cancer - Lung Mother   . Hypertension Father   . Drug abuse Sister   . Heart disease Maternal Grandfather   . Heart disease Maternal Grandmother   . Heart disease Paternal Grandfather   . Heart attack Paternal Grandfather   . Heart disease Paternal Grandmother   . Physical abuse Other   . Anxiety disorder Other   . Birth defects Other        [name]  . Dementia Maternal Aunt   . Alcohol abuse Paternal Aunt   . Alcohol abuse Paternal Uncle   . ADD / ADHD Neg Hx   . Bipolar disorder Neg Hx   . OCD Neg Hx   . Paranoid behavior Neg Hx   . Schizophrenia Neg Hx   . Seizures Neg Hx   . Sexual abuse Neg Hx     Past Surgical History:  Procedure Laterality Date  . COLONOSCOPY N/A 08/12/2014   Procedure: COLONOSCOPY;  Surgeon: Daneil Dolin, MD;  Location: [facility];  Service:  Endoscopy;  Laterality: N/A;  115  . thumb surgery    . UMBILICAL HERNIA REPAIR N/A 12/30/2014   Procedure: HERNIA REPAIR UMBILICAL ADULT WITH MESH;  Surgeon: Aviva Signs Md, MD;  Location: [facility] ORS;  Service: General;  Laterality: N/A;   Social History   Occupational History  . Not on file.   Social History Main Topics  . Smoking status: Passive Smoke Exposure - Never Smoker  . Smokeless tobacco: Never Used  . Alcohol use No     Comment: 4 oz wine  . Drug use: No  . Sexual activity: No

## 2017-03-05 NOTE — Progress Notes (Signed)
Office Visit Note  Patient: Catherine Turner             Date of Birth: Oct 03, 1955           MRN: [id number]             PCP: Glenda Chroman, MD Referring: Glenda Chroman, MD Visit Date: 03/07/2017 Occupation: @GUAROCC @    Subjective:  Joint and muscle pain   History of Present Illness: Catherine Turner is a 61 y.o. female seen in consultation per request of her PCP. According to patient in 2015 after any physical activity she started experiencing increased muscle and joint pain. She states that the pain could be so severe that she will have nausea and dry heaves. She states she usually gets hydrocodone on when necessary basis for pain management. She likes to avoid taking pain medications. She states she has not had any workup so far. In 2017 she had several COPD flares and she was mostly in bed which caused weakness in her lower extremities. She states 2 months ago Dr. Woody Seller referred her to physical therapy. They're working on her left hip area. She states after physical therapy she went to the [facility] where she almost collapsed as her muscles gave out. She rested the following day but due to severe nature of the pain she went to the emergency room. She was given hydrocodone there. She was having a lot of discomfort in her left hip and she had MRI which showed "a torn on hamstring" she was referred to Dr. Lorin Mercy orthopedics who evaluated her and advised her left total hip replacement and weight loss. She was also referred to me for ongoing arthralgias and myalgias. She has discomfort climbing stairs which causes knee pain. She denies discomfort in any of the joints. She denies any joint swelling.  Activities of Daily Living:  Patient reports morning stiffness for 1 hour.   Patient Reports nocturnal pain.  Difficulty dressing/grooming: Reports Difficulty climbing stairs: Reports Difficulty getting out of chair: Reports Difficulty using hands for taps, buttons, cutlery, and/or writing:  Denies   Review of Systems  Constitutional: Positive for fatigue. Negative for night sweats, weight gain, weight loss and weakness.  HENT: Negative for mouth sores, trouble swallowing, trouble swallowing, mouth dryness and nose dryness.   Eyes: Negative for pain, redness, visual disturbance and dryness.  Respiratory: Positive for shortness of breath. Negative for cough and difficulty breathing.        COPD  Cardiovascular: Negative for chest pain, palpitations, hypertension, irregular heartbeat and swelling in legs/feet.  Gastrointestinal: Positive for constipation. Negative for blood in stool and diarrhea.  Endocrine: Negative for increased urination.  Genitourinary: Negative for vaginal dryness.  Musculoskeletal: Positive for arthralgias, joint pain, myalgias, morning stiffness and myalgias. Negative for joint swelling, muscle weakness and muscle tenderness.  Skin: Negative for color change, rash, hair loss, skin tightness, ulcers and sensitivity to sunlight.  Allergic/Immunologic: Negative for susceptible to infections.  Neurological: Negative for dizziness, memory loss and night sweats.  Hematological: Negative for swollen glands.  Psychiatric/Behavioral: Positive for depressed mood and sleep disturbance. The patient is nervous/anxious.        Sleep apnea on CPAP    PMFS History:  Patient Active Problem List   Diagnosis Date Noted  . Obesity, morbid ([place]) 12/06/2016  . Asthma with bronchitis 09/21/2014  . History of colonic polyps   . Diverticulosis of colon without hemorrhage   . Thoracic outlet syndrome 05/07/2014  . Dyspnea 04/13/2014  .  Obstructive sleep apnea 07/30/2013  . Seasonal and perennial allergic rhinitis 07/30/2013  . PTSD (post-traumatic stress disorder) 01/13/2013  . Anxiety state 01/13/2013  . Insomnia secondary to depression with anxiety 01/13/2013  . Arm pain 01/13/2013    Past Medical History:  Diagnosis Date  . Arm fracture    right arm   . Asthma    . Bulging lumbar disc   . Complication of anesthesia    pt sts, "I am a very shallow breather and they have to do something special for me"/  . COPD (chronic obstructive pulmonary disease) ([place])   . Degenerative joint disease (DJD) of hip   . DJD (degenerative joint disease) of hip   . Elevated LFTs   . Hip pain   . Hypothyroidism   . Insomnia   . Nerve damage    right arm.  Marland Kitchen PTSD (post-traumatic stress disorder)   . Sleep apnea    has CPAP  . Thyroid disease     Family History  Problem Relation Age of Onset  . COPD Mother   . Cancer - Lung Mother   . Hypertension Father   . Drug abuse Sister   . Heart disease Maternal Grandfather   . Heart disease Maternal Grandmother   . Heart disease Paternal Grandfather   . Heart attack Paternal Grandfather   . Heart disease Paternal Grandmother   . Physical abuse Other   . Anxiety disorder Other   . Birth defects Other        [name]  . Dementia Maternal Aunt   . Alcohol abuse Paternal Aunt   . Alcohol abuse Paternal Uncle   . ADD / ADHD Neg Hx   . Bipolar disorder Neg Hx   . OCD Neg Hx   . Paranoid behavior Neg Hx   . Schizophrenia Neg Hx   . Seizures Neg Hx   . Sexual abuse Neg Hx    Past Surgical History:  Procedure Laterality Date  . COLONOSCOPY N/A 08/12/2014   Procedure: COLONOSCOPY;  Surgeon: Daneil Dolin, MD;  Location[facility];  Service: Endoscopy;  Laterality: N/A;  115  . thumb surgery    . UMBILICAL HERNIA REPAIR N/A 12/30/2014   Procedure: HERNIA REPAIR UMBILICAL ADULT WITH MESH;  Surgeon: Aviva Signs Md, MD;  Location: [facility];  Service: General;  Laterality: N/A;   Social History   Social History Narrative  . No narrative on file     Objective: Vital Signs: BP 122/82   Pulse 84   Resp 18   Ht 5\' 5"  (1.651 m)   Wt (!) 306 lb (138.8 kg)   BMI 50.92 kg/m    Physical Exam  Constitutional: She is oriented to person, place, and time. She appears well-developed and well-nourished.   HENT:  Head: Normocephalic and atraumatic.  Eyes: Conjunctivae and EOM are normal.  Neck: Normal range of motion.  Cardiovascular: Normal rate, regular rhythm, normal heart sounds and intact distal pulses.   Pulmonary/Chest: Effort normal and breath sounds normal.  Abdominal: Soft. Bowel sounds are normal.  Lymphadenopathy:    She has no cervical adenopathy.  Neurological: She is alert and oriented to person, place, and time.  Skin: Skin is warm and dry. Capillary refill takes less than 2 seconds.  Psychiatric: She has a normal mood and affect. Her behavior is normal.  Nursing note and vitals reviewed.    Musculoskeletal Exam: C-spine good range of motion. Thoracic and lumbar spine limited range of motion.  She is some discomfort with range of motion of her right shoulder. Left shoulder joint is good range of motion. Elbow joints wrist joints are good range of motion with no synovitis. She has some thickening of PIP/DIP joints no synovitis was noted. She has discomfort range of motion of her hip joints. Knee joints are good range of motion with no synovitis. Ankle joints MTPs PIPs are good range of motion with no synovitis.  CDAI Exam: No CDAI exam completed.    Investigation: Findings:  12/14/2016 LDL 120, Triglycerides 233, CBC normal, vitamin D 23.4, TSH 1.800, Vitamin B 12 383, CMP normal except for glucose elevated 124.     Imaging: No results found.  Speciality Comments: No specialty comments available.    Procedures:  No procedures performed Allergies: Advair diskus [fluticasone-salmeterol]; Escitalopram; Jasmine fragrance; Breo ellipta [fluticasone furoate-vilanterol]; Diclofenac; Furosemide; Lactase; Molds & smuts; Peanuts [peanut oil]; Potassium-containing compounds; Pravastatin; Latex; Pork-derived products; and Sulfa antibiotics   Assessment / Plan:     Visit Diagnoses: Multiple joint pain: She continues to have some discomfort in her right shoulder and left hip.  Per patient's request I will obtain following labs. Although I do not see any synovitis on examination.  Other fatigue: She reports fatigue after activities.  Primary osteoarthritis of left hip: I reviewed her x-rays which shows moderate osteoarthritis. Dr. Lorin Mercy recommended total hip replacement. She also reports having left hamstring strain which is causing discomfort. It may cause elevation of CK.  Tendinopathy of right shoulder: Due to prior injury.  Primary insomnia: She has sleep apnea and is on CPAP.  Obesity: Weight loss diet and exercise was discussed.  Vitamin D deficiency: Taking supplements  Other medical problems are listed as follows:  History of thoracic outlet syndrome  History of posttraumatic stress disorder (PTSD)  History of asthma  History of chronic obstructive pulmonary disease  History of hyperlipidemia  Obstructive sleep apnea - on CPAP  Anxiety state    Orders: Orders Placed This Encounter  Procedures  . Sedimentation rate  . CK  . Antinuclear Antib (ANA)  . Rheumatoid Factor  . Cyclic citrul peptide antibody, IgG  . Uric acid   No orders of the defined types were placed in this encounter.   Face-to-face time spent with patient was 45 minutes. 50% of time was spent in counseling and coordination of care.  Follow-Up Instructions: Return for Arthralgia, myalgia.   Bo Merino, MD  Note - This record has been created using Editor, commissioning.  Chart creation errors have been sought, but may not always  have been located. Such creation errors do not reflect on  the standard of medical care.

## 2017-03-07 ENCOUNTER — Ambulatory Visit (INDEPENDENT_AMBULATORY_CARE_PROVIDER_SITE_OTHER): Payer: Medicare Other | Admitting: Rheumatology

## 2017-03-07 ENCOUNTER — Other Ambulatory Visit: Payer: Self-pay | Admitting: Rheumatology

## 2017-03-07 ENCOUNTER — Encounter: Payer: Self-pay | Admitting: Rheumatology

## 2017-03-07 VITALS — BP 122/82 | HR 84 | Resp 18 | Ht 65.0 in | Wt 306.0 lb

## 2017-03-07 DIAGNOSIS — Z8709 Personal history of other diseases of the respiratory system: Secondary | ICD-10-CM

## 2017-03-07 DIAGNOSIS — G4733 Obstructive sleep apnea (adult) (pediatric): Secondary | ICD-10-CM | POA: Diagnosis not present

## 2017-03-07 DIAGNOSIS — Z8659 Personal history of other mental and behavioral disorders: Secondary | ICD-10-CM

## 2017-03-07 DIAGNOSIS — F411 Generalized anxiety disorder: Secondary | ICD-10-CM

## 2017-03-07 DIAGNOSIS — E559 Vitamin D deficiency, unspecified: Secondary | ICD-10-CM | POA: Diagnosis not present

## 2017-03-07 DIAGNOSIS — F5101 Primary insomnia: Secondary | ICD-10-CM

## 2017-03-07 DIAGNOSIS — R5383 Other fatigue: Secondary | ICD-10-CM | POA: Diagnosis not present

## 2017-03-07 DIAGNOSIS — M2550 Pain in unspecified joint: Secondary | ICD-10-CM | POA: Diagnosis not present

## 2017-03-07 DIAGNOSIS — M67911 Unspecified disorder of synovium and tendon, right shoulder: Secondary | ICD-10-CM | POA: Diagnosis not present

## 2017-03-07 DIAGNOSIS — Z8669 Personal history of other diseases of the nervous system and sense organs: Secondary | ICD-10-CM

## 2017-03-07 DIAGNOSIS — Z8639 Personal history of other endocrine, nutritional and metabolic disease: Secondary | ICD-10-CM

## 2017-03-07 DIAGNOSIS — M1612 Unilateral primary osteoarthritis, left hip: Secondary | ICD-10-CM | POA: Diagnosis not present

## 2017-03-08 LAB — CK: Total CK: 52 U/L (ref 29–143)

## 2017-03-08 LAB — SEDIMENTATION RATE: Sed Rate: 7 mm/hr (ref 0–30)

## 2017-03-08 LAB — ANTI-NUCLEAR AB-TITER (ANA TITER): ANA Titer 1: 1:80 {titer} — ABNORMAL HIGH

## 2017-03-08 LAB — ANA: ANA: POSITIVE — AB

## 2017-03-08 LAB — URIC ACID: URIC ACID, SERUM: 5.9 mg/dL (ref 2.5–7.0)

## 2017-03-08 LAB — RHEUMATOID FACTOR

## 2017-03-08 LAB — CYCLIC CITRUL PEPTIDE ANTIBODY, IGG

## 2017-03-08 NOTE — Progress Notes (Signed)
Please add ENA, C3, C4

## 2017-03-09 ENCOUNTER — Other Ambulatory Visit: Payer: Self-pay | Admitting: *Deleted

## 2017-03-09 DIAGNOSIS — M2550 Pain in unspecified joint: Secondary | ICD-10-CM

## 2017-03-12 ENCOUNTER — Telehealth: Payer: Self-pay | Admitting: Rheumatology

## 2017-03-12 LAB — CP5000020 ENA PANEL
ENA SM Ab Ser-aCnc: <1
Ribonucleic Protein(ENA) Antibody, IgG: <1
SSA (RO) (ENA) ANTIBODY, IGG: NEGATIVE
SSB (LA) (ENA) ANTIBODY, IGG: NEGATIVE
Scleroderma (Scl-70) (ENA) Antibody, IgG: <1
ds DNA Ab: 10 IU/mL — ABNORMAL HIGH

## 2017-03-12 LAB — C3 AND C4
C3 Complement: 169 mg/dL (ref 83–193)
C4 COMPLEMENT: 43 mg/dL (ref 15–57)

## 2017-03-12 NOTE — Progress Notes (Signed)
wnl

## 2017-03-12 NOTE — Telephone Encounter (Signed)
Patient advised that labs were added to initial labs. Patient states she has been having joint pain. Patient states she is having facial pain, shoulder pain and in her neck. Patient states she the pain is on her left side. Patient states the pain has been pretty severe and it has been a burning sensation. Patient is having trouble walking this morning.

## 2017-03-12 NOTE — Telephone Encounter (Signed)
Left message to advise patient to try tylenol.

## 2017-03-12 NOTE — Telephone Encounter (Signed)
Patient was unable to come in last week for labs because she could not walk. Patient would like to discuss if she can have labs done closer to her, or if she needs to come here. Please call to advise.

## 2017-03-12 NOTE — Telephone Encounter (Signed)
She may try Tylenol. No dx has been established so far for any specific treatment.

## 2017-03-13 NOTE — Progress Notes (Signed)
Will discuss at follow up visit

## 2017-04-15 DIAGNOSIS — M1612 Unilateral primary osteoarthritis, left hip: Secondary | ICD-10-CM | POA: Insufficient documentation

## 2017-04-15 DIAGNOSIS — R768 Other specified abnormal immunological findings in serum: Secondary | ICD-10-CM | POA: Insufficient documentation

## 2017-04-15 DIAGNOSIS — M67911 Unspecified disorder of synovium and tendon, right shoulder: Secondary | ICD-10-CM | POA: Insufficient documentation

## 2017-04-15 NOTE — Progress Notes (Signed)
Office Visit Note  Patient: Catherine Turner             Date of Birth: [birth year]           MRN: [id number]             PCP: Glenda Chroman, MD Referring: Glenda Chroman, MD Visit Date: 04/17/2017 Occupation: @GUAROCC @    Subjective:  Left hip pain, myalgias.   History of Present Illness: Catherine Turner is a 60 y.o. female with history of arthralgias and myalgias. She states she has moved back into her house where she has noticed years. That's been very helpful. She was living with her girlfriend which was quite a stressful situation for her. She continues to have some muscle and joint pain she denies any joint swelling.  Activities of Daily Living:  Patient reports morning stiffness for 30 minutes.   Patient Reports nocturnal pain.  Difficulty dressing/grooming: Denies Difficulty climbing stairs: Reports Difficulty getting out of chair: Reports Difficulty using hands for taps, buttons, cutlery, and/or writing: Denies   Review of Systems  Constitutional: Positive for fatigue. Negative for night sweats, weight gain, weight loss and weakness.  HENT: Positive for mouth dryness. Negative for mouth sores, trouble swallowing, trouble swallowing and nose dryness.        Related to CPAP  Eyes: Negative for pain, redness, visual disturbance and dryness.  Respiratory: Negative for cough, shortness of breath and difficulty breathing.   Cardiovascular: Negative for chest pain, palpitations, hypertension, irregular heartbeat and swelling in legs/feet.  Gastrointestinal: Negative for blood in stool, constipation and diarrhea.  Endocrine: Negative for increased urination.  Genitourinary: Negative for vaginal dryness.  Musculoskeletal: Positive for arthralgias, joint pain, myalgias, morning stiffness and myalgias. Negative for joint swelling, muscle weakness and muscle tenderness.  Skin: Negative for color change, rash, hair loss, skin tightness, ulcers and sensitivity to sunlight.    Allergic/Immunologic: Negative for susceptible to infections.  Neurological: Negative for dizziness, memory loss and night sweats.  Hematological: Negative for swollen glands.  Psychiatric/Behavioral: Positive for depressed mood and sleep disturbance. The patient is nervous/anxious.     PMFS History:  Patient Active Problem List   Diagnosis Date Noted  . Positive ANA nucleolar, positive dsDNA 04/15/2017  . Tendinopathy of right shoulder 04/15/2017  . Primary osteoarthritis of left hip 04/15/2017  . Obesity, morbid ([place]) 12/06/2016  . Asthma with bronchitis 09/21/2014  . History of colonic polyps   . Diverticulosis of colon without hemorrhage   . Thoracic outlet syndrome 05/07/2014  . Dyspnea 04/13/2014  . Obstructive sleep apnea 07/30/2013  . Seasonal and perennial allergic rhinitis 07/30/2013  . PTSD (post-traumatic stress disorder) 01/13/2013  . Anxiety state 01/13/2013  . Insomnia secondary to depression with anxiety 01/13/2013  . Arm pain 01/13/2013    Past Medical History:  Diagnosis Date  . Arm fracture    right arm   . Asthma   . Bulging lumbar disc   . Complication of anesthesia    pt sts, "I am a very shallow breather and they have to do something special for me"/  . COPD (chronic obstructive pulmonary disease) ([place])   . Degenerative joint disease (DJD) of hip   . DJD (degenerative joint disease) of hip   . Elevated LFTs   . Hip pain   . Hypothyroidism   . Insomnia   . Nerve damage    right arm.  Marland Kitchen PTSD (post-traumatic stress disorder)   . Sleep apnea  has CPAP  . Thyroid disease     Family History  Problem Relation Age of Onset  . COPD Mother   . Cancer - Lung Mother   . Hypertension Father   . Drug abuse Sister   . Heart disease Maternal Grandfather   . Heart disease Maternal Grandmother   . Heart disease Paternal Grandfather   . Heart attack Paternal Grandfather   . Heart disease Paternal Grandmother   . Physical abuse Other   . Anxiety  disorder Other   . Birth defects Other        [name]  . Dementia Maternal Aunt   . Alcohol abuse Paternal Aunt   . Alcohol abuse Paternal Uncle   . ADD / ADHD Neg Hx   . Bipolar disorder Neg Hx   . OCD Neg Hx   . Paranoid behavior Neg Hx   . Schizophrenia Neg Hx   . Seizures Neg Hx   . Sexual abuse Neg Hx    Past Surgical History:  Procedure Laterality Date  . COLONOSCOPY N/A 08/12/2014   Procedure: COLONOSCOPY;  Surgeon: Daneil Dolin, MD;  Location: [facility];  Service: Endoscopy;  Laterality: N/A;  115  . thumb surgery    . UMBILICAL HERNIA REPAIR N/A 12/30/2014   Procedure: HERNIA REPAIR UMBILICAL ADULT WITH MESH;  Surgeon: Aviva Signs Md, MD;  Location: [facility];  Service: General;  Laterality: N/A;   Social History   Social History Narrative  . No narrative on file     Objective: Vital Signs: BP 138/76   Pulse 78   Resp 16   Ht 5' 5"  (1.651 m)   Wt (!) 303 lb (137.4 kg)   BMI 50.42 kg/m    Physical Exam  Constitutional: She is oriented to person, place, and time. She appears well-developed and well-nourished.  HENT:  Head: Normocephalic and atraumatic.  Eyes: Conjunctivae and EOM are normal.  Neck: Normal range of motion.  Cardiovascular: Normal rate, regular rhythm, normal heart sounds and intact distal pulses.   Pulmonary/Chest: Effort normal and breath sounds normal.  Abdominal: Soft. Bowel sounds are normal.  Lymphadenopathy:    She has no cervical adenopathy.  Neurological: She is alert and oriented to person, place, and time.  Skin: Skin is warm and dry. Capillary refill takes less than 2 seconds.  Psychiatric: She has a normal mood and affect. Her behavior is normal.  Nursing note and vitals reviewed.    Musculoskeletal Exam: C-spine and thoracic lumbar spine good range of motion. She had discomfort range of motion of her right shoulder joint which was limited. Elbow joints wrist joint MCPs PIPs DIPs are good range of motion with no  synovitis. She has tightness in her both hamstring especially in her left hamstring. Knee joints ankles were good range of motion with no synovitis.  CDAI Exam: No CDAI exam completed.    Investigation: Findings:  12/14/2016 LDL 120, Triglycerides 233, CBC normal, vitamin D 23.4, TSH 1.800, Vitamin B 12 383, CMP normal except for glucose elevated 124.    03/07/2017 ANA 1:80 nucleolar, dsDNA positive(RNP, SCL 70, SSA, SSB, Smith negative) C3-C4 normal, RF negative, anti-CCP negative, ESR 7, uric acid 5.9, CK 52 Imaging: No results found.  Speciality Comments: No specialty comments available.    Procedures:  No procedures performed Allergies: Advair diskus [fluticasone-salmeterol]; Escitalopram; Jasmine fragrance; Breo ellipta [fluticasone furoate-vilanterol]; Diclofenac; Furosemide; Lactase; Molds & smuts; Peanuts [peanut oil]; Potassium-containing compounds; Pravastatin; Latex; Pork-derived products; and Sulfa antibiotics  Assessment / Plan:     Visit Diagnoses: Positive ANA nucleolar, positive dsDNA - h/o polyarthralgia and fatigue. Patient has no clinical features of autoimmune disease on examination. At this point I do not see any need for giving her immunosuppressive therapy. I will repeat her labs again in in 6 months. Detailed counseling regarding the lab was provided. Features of autoimmune diseases were also discussed at length. She supposed to notify me if she develops any new symptoms. Plan: Anti-DNA antibody, double-stranded, ANA, C3 and C4 in 6 months  Tendinopathy of right shoulder: Chronic discomfort  Primary osteoarthritis of left hip - Moderate followed up by Dr. Lorin Mercy  She has bilateral lower extremity discomfort due to hamstring injury.  PTSD (post-traumatic stress disorder)  Anxiety state  Thoracic outlet syndrome  Obstructive sleep apnea  Insomnia secondary to depression with anxiety  Obesity, morbid ([place])    Orders: Orders Placed This Encounter    Procedures  . Anti-DNA antibody, double-stranded  . ANA  . C3 and C4   No orders of the defined types were placed in this encounter.   Face-to-face time spent with patient was 30 minutes. Greater than 50% of time was spent in counseling and coordination of care.  Follow-Up Instructions: Return in about 6 months (around 10/15/2017), or if symptoms worsen or fail to improve, for +ANA, DSDNA, myalgia.   Bo Merino, MD  Note - This record has been created using Editor, commissioning.  Chart creation errors have been sought, but may not always  have been located. Such creation errors do not reflect on  the standard of medical care.

## 2017-04-17 ENCOUNTER — Encounter: Payer: Self-pay | Admitting: Rheumatology

## 2017-04-17 ENCOUNTER — Ambulatory Visit (INDEPENDENT_AMBULATORY_CARE_PROVIDER_SITE_OTHER): Payer: Medicare Other | Admitting: Rheumatology

## 2017-04-17 VITALS — BP 138/76 | HR 78 | Resp 16 | Ht 65.0 in | Wt 303.0 lb

## 2017-04-17 DIAGNOSIS — M1612 Unilateral primary osteoarthritis, left hip: Secondary | ICD-10-CM | POA: Diagnosis not present

## 2017-04-17 DIAGNOSIS — G540 Brachial plexus disorders: Secondary | ICD-10-CM | POA: Diagnosis not present

## 2017-04-17 DIAGNOSIS — F418 Other specified anxiety disorders: Secondary | ICD-10-CM | POA: Diagnosis not present

## 2017-04-17 DIAGNOSIS — F4310 Post-traumatic stress disorder, unspecified: Secondary | ICD-10-CM

## 2017-04-17 DIAGNOSIS — R768 Other specified abnormal immunological findings in serum: Secondary | ICD-10-CM | POA: Diagnosis not present

## 2017-04-17 DIAGNOSIS — G4733 Obstructive sleep apnea (adult) (pediatric): Secondary | ICD-10-CM

## 2017-04-17 DIAGNOSIS — M67911 Unspecified disorder of synovium and tendon, right shoulder: Secondary | ICD-10-CM | POA: Diagnosis not present

## 2017-04-17 DIAGNOSIS — F411 Generalized anxiety disorder: Secondary | ICD-10-CM

## 2017-04-17 DIAGNOSIS — F5105 Insomnia due to other mental disorder: Secondary | ICD-10-CM

## 2017-04-17 NOTE — Patient Instructions (Addendum)
Natural anti-inflammatories  You can purchase these at [employer], [employer] or online.  . Turmeric (capsules)  . Ginger (ginger root or capsules)  . Omega 3 (Fish, flax seeds, chia seeds, walnuts, almonds)  . Tart cherry (dried or extract)   Patient should be under the care of a physician while taking these supplements. This may not be reproduced without the permission of Dr. Bo Merino.  Labs are due in January ANA, ds DNA, C3, C4.

## 2017-05-11 ENCOUNTER — Telehealth: Payer: Self-pay | Admitting: Internal Medicine

## 2017-05-11 NOTE — Telephone Encounter (Signed)
She can talk with her DME company about comfort choice of CPAP mask.  She can talk with whoever is treating her torn hamstring about positional comfort issues. If the injury was 6 months ago, I'm surprised that she is only starting physical therapy now.

## 2017-05-11 NOTE — Telephone Encounter (Signed)
Spoke with pt, aware of CY's recs.  Nothing further needed.

## 2017-05-11 NOTE — Telephone Encounter (Signed)
Spoke with pt, she will not see CY until May 2019. She has not used her CPAP machine since April because she has to lay on her stomach at night. She is due for a new mask so she is going to see if she can find a way to use her CPAP correctly.  She just wants CY to know why she hasn't been using it. Do you have any suggestions about this situation? She also wanted to update you on her dx for Lupus. She cannot sleep on her back with CPAP because the pain relief she receives is when she is on her stomach due to her hamstring being torn. She starts physical therapy next Friday. I advised her that the insurance will not pay if she is not compliant and encouraged her to use the CPAP as much as she can. FYI CY.

## 2017-08-08 ENCOUNTER — Telehealth: Payer: Self-pay | Admitting: Rheumatology

## 2017-08-08 NOTE — Telephone Encounter (Signed)
Patient called to see if she needed to come in for blood work before her appointment on 10/15/17.  CB#[phone]

## 2017-08-09 NOTE — Telephone Encounter (Signed)
Left message to advise patient there are some labs due August 17, 2017. Advised of lab hours.

## 2017-10-01 NOTE — Progress Notes (Signed)
Office Visit Note  Patient: Catherine Turner             Date of Birth: [birth year]           MRN: [id number]             PCP: Glenda Chroman, MD Referring: Glenda Chroman, MD Visit Date: 10/15/2017 Occupation: @GUAROCC @    Subjective:  Other (questions about rash and reactions to foods )   History of Present Illness: Catherine Turner is a 62 y.o. female history of positive ANA and osteoarthritis.  She states recently she has developed some ulcers in her mouth related to eating crackers and ice cream.  She also developed a rash one time after eating eggs.  Since her umbilical hernia repair she has been having some discomfort in the abdominal region and left hip.  She was seen by orthopedic physician who diagnosed her with severe osteoarthritis of her left hip joint.  He denies any joint swelling.  Activities of Daily Living:  Patient reports morning stiffness for 2 minutes.   Patient Reports nocturnal pain.  Difficulty dressing/grooming: Denies Difficulty climbing stairs: Reports Difficulty getting out of chair: Reports Difficulty using hands for taps, buttons, cutlery, and/or writing: Denies   Review of Systems  Constitutional: Positive for fatigue. Negative for night sweats, weight gain, weight loss and weakness.  HENT: Positive for mouth dryness. Negative for mouth sores, trouble swallowing, trouble swallowing and nose dryness.   Eyes: Positive for dryness. Negative for pain, redness and visual disturbance.  Respiratory: Negative for cough, shortness of breath and difficulty breathing.   Cardiovascular: Negative for chest pain, palpitations, hypertension, irregular heartbeat and swelling in legs/feet.  Gastrointestinal: Negative for blood in stool, constipation and diarrhea.  Endocrine: Negative for increased urination.  Genitourinary: Negative for vaginal dryness.  Musculoskeletal: Positive for arthralgias, joint pain and morning stiffness. Negative for joint swelling, myalgias,  muscle weakness, muscle tenderness and myalgias.  Skin: Negative for color change, rash, hair loss, skin tightness, ulcers and sensitivity to sunlight.  Allergic/Immunologic: Negative for susceptible to infections.  Neurological: Negative for dizziness, memory loss and night sweats.  Hematological: Negative for swollen glands.  Psychiatric/Behavioral: Positive for depressed mood and sleep disturbance. The patient is nervous/anxious.     PMFS History:  Patient Active Problem List   Diagnosis Date Noted  . Positive ANA nucleolar, positive dsDNA 04/15/2017  . Tendinopathy of right shoulder 04/15/2017  . Primary osteoarthritis of left hip 04/15/2017  . Obesity, morbid ([place]) 12/06/2016  . Asthma with bronchitis 09/21/2014  . History of colonic polyps   . Diverticulosis of colon without hemorrhage   . Thoracic outlet syndrome 05/07/2014  . Dyspnea 04/13/2014  . Obstructive sleep apnea 07/30/2013  . Seasonal and perennial allergic rhinitis 07/30/2013  . PTSD (post-traumatic stress disorder) 01/13/2013  . Anxiety state 01/13/2013  . Insomnia secondary to depression with anxiety 01/13/2013  . Arm pain 01/13/2013    Past Medical History:  Diagnosis Date  . Arm fracture    right arm   . Asthma   . Bulging lumbar disc   . Complication of anesthesia    pt sts, "I am a very shallow breather and they have to do something special for me"/  . COPD (chronic obstructive pulmonary disease) ([place])   . Degenerative joint disease (DJD) of hip   . DJD (degenerative joint disease) of hip   . Elevated LFTs   . Hip pain   . Hypothyroidism   . Insomnia   .  Nerve damage    right arm.  Marland Kitchen PTSD (post-traumatic stress disorder)   . Sleep apnea    has CPAP  . Thyroid disease     Family History  Problem Relation Age of Onset  . COPD Mother   . Cancer - Lung Mother   . Hypertension Father   . Drug abuse Sister   . Heart disease Maternal Grandfather   . Heart disease Maternal Grandmother   . Heart  disease Paternal Grandfather   . Heart attack Paternal Grandfather   . Heart disease Paternal Grandmother   . Physical abuse Other   . Anxiety disorder Other   . Birth defects Other        [name]  . Dementia Maternal Aunt   . Alcohol abuse Paternal Aunt   . Alcohol abuse Paternal Uncle   . ADD / ADHD Neg Hx   . Bipolar disorder Neg Hx   . OCD Neg Hx   . Paranoid behavior Neg Hx   . Schizophrenia Neg Hx   . Seizures Neg Hx   . Sexual abuse Neg Hx    Past Surgical History:  Procedure Laterality Date  . COLONOSCOPY N/A 08/12/2014   Procedure: COLONOSCOPY;  Surgeon: Daneil Dolin, MD;  Location: [facility];  Service: Endoscopy;  Laterality: N/A;  115  . thumb surgery    . UMBILICAL HERNIA REPAIR N/A 12/30/2014   Procedure: HERNIA REPAIR UMBILICAL ADULT WITH MESH;  Surgeon: Aviva Signs Md, MD;  Location: [facility];  Service: General;  Laterality: N/A;   Social History   Social History Narrative  . Not on file     Objective: Vital Signs: BP 140/88 (BP Location: Left Arm, Patient Position: Sitting, Cuff Size: Normal)   Pulse 94   Resp 18   Ht 5\' 5"  (1.651 m)   Wt (!) 303 lb 9.6 oz (137.7 kg) Comment: per patient, refused to weigh  BMI 50.52 kg/m    Physical Exam  Constitutional: She is oriented to person, place, and time. She appears well-developed and well-nourished.  HENT:  Head: Normocephalic and atraumatic.  Eyes: Conjunctivae and EOM are normal.  Neck: Normal range of motion.  Cardiovascular: Normal rate, regular rhythm, normal heart sounds and intact distal pulses.  Pulmonary/Chest: Effort normal and breath sounds normal.  Abdominal: Soft. Bowel sounds are normal.  Lymphadenopathy:    She has no cervical adenopathy.  Neurological: She is alert and oriented to person, place, and time.  Skin: Skin is warm and dry. Capillary refill takes less than 2 seconds.  Psychiatric: She has a normal mood and affect. Her behavior is normal.  Nursing note and vitals  reviewed.    Musculoskeletal Exam: C-spine good range of motion.  She is some thoracic kyphosis.  Limited range of motion of her lumbar spine.  Shoulder joints, elbow joints, wrist joints, MCPs PIPs DIPs with good range of motion with no synovitis.  She has limited range of motion of her left hip joint with discomfort.  Knee joints with good range of motion.  CDAI Exam: No CDAI exam completed.    Investigation: No additional findings.  Imaging: No results found.  Speciality Comments: No specialty comments available.    Procedures:  No procedures performed Allergies: Advair diskus [fluticasone-salmeterol]; Escitalopram; Jasmine fragrance; Breo ellipta [fluticasone furoate-vilanterol]; Diclofenac; Furosemide; Lactase; Molds & smuts; Peanuts [peanut oil]; Potassium-containing compounds; Pravastatin; Latex; Pork-derived products; and Sulfa antibiotics   Assessment / Plan:     Visit Diagnoses: Positive ANA nucleolar,  positive dsDNA - h/o polyarthralgia and fatigue. Patient has no clinical features of autoimmune disease on examination.  Patient believes that she has been having more symptoms of lupus.  I do not see any clinical features of lupus on examination.  She gets some oral ulcers related to certain food only.  Per her request I will obtain additional labs today.- Plan: CBC with Differential/Platelet, COMPLETE METABOLIC PANEL WITH GFR, Urinalysis, Routine w reflex microscopic, Anti-DNA antibody, double-stranded, C3 and C4, Sedimentation rate  Primary osteoarthritis of left hip - Moderate followed up by Dr. Lorin Mercy of pain and discomfort in her left hip.  Tendinopathy of right shoulder Crohn: Chronic pain  History of posttraumatic stress disorder (PTSD): She is taking medications.  History of obesity: Weight loss diet and exercise was discussed at length.  History of anxiety  Insomnia secondary to depression with anxiety  History of sleep apnea  Thoracic outlet  syndrome  History of colonic polyps  History of diverticulosis  History of asthma  Other fatigue - Plan: VITAMIN D 25 Hydroxy (Vit-D Deficiency, Fractures)    Orders: Orders Placed This Encounter  Procedures  . CBC with Differential/Platelet  . COMPLETE METABOLIC PANEL WITH GFR  . Urinalysis, Routine w reflex microscopic  . Anti-DNA antibody, double-stranded  . C3 and C4  . Sedimentation rate  . VITAMIN D 25 Hydroxy (Vit-D Deficiency, Fractures)   No orders of the defined types were placed in this encounter.   Face-to-face time spent with patient was 30 minutes.  Greater than 50% of time was spent in counseling and coordination of care.  Follow-Up Instructions: Return for ANA, +ds DNA, OA.   Bo Merino, MD  Note - This record has been created using Editor, commissioning.  Chart creation errors have been sought, but may not always  have been located. Such creation errors do not reflect on  the standard of medical care.

## 2017-10-05 ENCOUNTER — Telehealth: Payer: Self-pay | Admitting: Rheumatology

## 2017-10-05 NOTE — Telephone Encounter (Signed)
Patient called stating that she is schedule for appointment on 3/11 and wants to know if she should get her bloodwork done before the appointment.

## 2017-10-08 NOTE — Telephone Encounter (Signed)
Patient advised she may wait until her appointment to have her blood work drawn.

## 2017-10-15 ENCOUNTER — Ambulatory Visit (INDEPENDENT_AMBULATORY_CARE_PROVIDER_SITE_OTHER): Payer: Medicare Other | Admitting: Rheumatology

## 2017-10-15 ENCOUNTER — Encounter: Payer: Self-pay | Admitting: Rheumatology

## 2017-10-15 VITALS — BP 140/88 | HR 94 | Resp 18 | Ht 65.0 in | Wt 303.6 lb

## 2017-10-15 DIAGNOSIS — Z8719 Personal history of other diseases of the digestive system: Secondary | ICD-10-CM

## 2017-10-15 DIAGNOSIS — Z8639 Personal history of other endocrine, nutritional and metabolic disease: Secondary | ICD-10-CM | POA: Diagnosis not present

## 2017-10-15 DIAGNOSIS — M67911 Unspecified disorder of synovium and tendon, right shoulder: Secondary | ICD-10-CM

## 2017-10-15 DIAGNOSIS — Z8709 Personal history of other diseases of the respiratory system: Secondary | ICD-10-CM

## 2017-10-15 DIAGNOSIS — Z8669 Personal history of other diseases of the nervous system and sense organs: Secondary | ICD-10-CM

## 2017-10-15 DIAGNOSIS — G540 Brachial plexus disorders: Secondary | ICD-10-CM

## 2017-10-15 DIAGNOSIS — Z86010 Personal history of colonic polyps: Secondary | ICD-10-CM

## 2017-10-15 DIAGNOSIS — M1612 Unilateral primary osteoarthritis, left hip: Secondary | ICD-10-CM

## 2017-10-15 DIAGNOSIS — Z8659 Personal history of other mental and behavioral disorders: Secondary | ICD-10-CM | POA: Diagnosis not present

## 2017-10-15 DIAGNOSIS — F418 Other specified anxiety disorders: Secondary | ICD-10-CM

## 2017-10-15 DIAGNOSIS — R5383 Other fatigue: Secondary | ICD-10-CM

## 2017-10-15 DIAGNOSIS — F5105 Insomnia due to other mental disorder: Secondary | ICD-10-CM | POA: Diagnosis not present

## 2017-10-15 DIAGNOSIS — R768 Other specified abnormal immunological findings in serum: Secondary | ICD-10-CM

## 2017-10-16 LAB — COMPLETE METABOLIC PANEL WITH GFR
AG RATIO: 1.7 (calc) (ref 1.0–2.5)
ALT: 24 U/L (ref 6–29)
AST: 28 U/L (ref 10–35)
Albumin: 4.9 g/dL (ref 3.6–5.1)
Alkaline phosphatase (APISO): 76 U/L (ref 33–130)
BUN: 16 mg/dL (ref 7–25)
CO2: 28 mmol/L (ref 20–32)
CREATININE: 1.05 mg/dL (ref 0.50–1.05)
Calcium: 10.7 mg/dL — ABNORMAL HIGH (ref 8.6–10.4)
Chloride: 99 mmol/L (ref 98–110)
GFR, EST NON AFRICAN AMERICAN: 58 mL/min/{1.73_m2} — AB (ref >=60)
GFR, Est African American: 67 mL/min/{1.73_m2} (ref >=60)
GLUCOSE: 112 mg/dL — AB (ref 65–99)
Globulin: 2.9 g/dL (calc) (ref 1.9–3.7)
POTASSIUM: 4 mmol/L (ref 3.5–5.3)
Sodium: 136 mmol/L (ref 135–146)
Total Bilirubin: 0.8 mg/dL (ref 0.2–1.2)
Total Protein: 7.8 g/dL (ref 6.1–8.1)

## 2017-10-16 LAB — URINALYSIS, ROUTINE W REFLEX MICROSCOPIC
BILIRUBIN URINE: NEGATIVE
GLUCOSE, UA: NEGATIVE
Hgb urine dipstick: NEGATIVE
Ketones, ur: NEGATIVE
NITRITE: NEGATIVE
PROTEIN: NEGATIVE
Specific Gravity, Urine: 1.029 (ref 1.001–1.03)
WBC, UA: >=60 /HPF — AB (ref 0–5)
pH: 5.5 (ref 5.0–8.0)

## 2017-10-16 LAB — CBC WITH DIFFERENTIAL/PLATELET
BASOS ABS: 128 {cells}/uL (ref 0–200)
Basophils Relative: 1 %
Eosinophils Absolute: 474 cells/uL (ref 15–500)
Eosinophils Relative: 3.7 %
HEMATOCRIT: 45.9 % — AB (ref 35.0–45.0)
Hemoglobin: 15.8 g/dL — ABNORMAL HIGH (ref 11.7–15.5)
LYMPHS ABS: 4877 {cells}/uL — AB (ref 850–3900)
MCH: 28.4 pg (ref 27.0–33.0)
MCHC: 34.4 g/dL (ref 32.0–36.0)
MCV: 82.4 fL (ref 80.0–100.0)
MPV: 11.5 fL (ref 7.5–12.5)
Monocytes Relative: 6.4 %
NEUTROS PCT: 50.8 %
Neutro Abs: 6502 cells/uL (ref 1500–7800)
Platelets: 278 10*3/uL (ref 140–400)
RBC: 5.57 10*6/uL — AB (ref 3.80–5.10)
RDW: 14.3 % (ref 11.0–15.0)
Total Lymphocyte: 38.1 %
WBC: 12.8 10*3/uL — AB (ref 3.8–10.8)
WBCMIX: 819 {cells}/uL (ref 200–950)

## 2017-10-16 LAB — VITAMIN D 25 HYDROXY (VIT D DEFICIENCY, FRACTURES): VIT D 25 HYDROXY: 33 ng/mL (ref 30–100)

## 2017-10-16 LAB — C3 AND C4
C3 Complement: 188 mg/dL (ref 83–193)
C4 COMPLEMENT: 44 mg/dL (ref 15–57)

## 2017-10-16 LAB — SEDIMENTATION RATE: SED RATE: 17 mm/h (ref 0–30)

## 2017-10-16 LAB — ANTI-DNA ANTIBODY, DOUBLE-STRANDED: ds DNA Ab: 16 IU/mL — ABNORMAL HIGH

## 2017-10-16 NOTE — Progress Notes (Signed)
I called patient and discussed the lab results.  She was advised to see her PCP for UTI.  Her double-stranded DNA is elevated but complements are normal.  I do not see based on her clinical examination and lab work that she has active autoimmune disease.  We discussed AVISE test during the last visit.  She wants to do them at her follow-up visit in August.

## 2017-12-06 ENCOUNTER — Encounter: Payer: Self-pay | Admitting: Internal Medicine

## 2017-12-07 ENCOUNTER — Ambulatory Visit (INDEPENDENT_AMBULATORY_CARE_PROVIDER_SITE_OTHER): Payer: Medicare Other | Admitting: Internal Medicine

## 2017-12-07 ENCOUNTER — Encounter: Payer: Self-pay | Admitting: Internal Medicine

## 2017-12-07 VITALS — BP 118/72 | HR 73 | Ht 65.0 in | Wt 311.0 lb

## 2017-12-07 DIAGNOSIS — J45909 Unspecified asthma, uncomplicated: Secondary | ICD-10-CM | POA: Diagnosis not present

## 2017-12-07 DIAGNOSIS — G4733 Obstructive sleep apnea (adult) (pediatric): Secondary | ICD-10-CM | POA: Diagnosis not present

## 2017-12-07 MED ORDER — ALBUTEROL SULFATE HFA 108 (90 BASE) MCG/ACT IN AERS
2.0000 | INHALATION_SPRAY | RESPIRATORY_TRACT | 99 refills | Status: DC | PRN
Start: 2017-12-07 — End: 2022-10-27

## 2017-12-07 NOTE — Assessment & Plan Note (Signed)
Good compliance and control.  She recognizes she is better off using CPAP, especially as she has gained weight. Plan-continue auto 11-15.  Refill supplies.

## 2017-12-07 NOTE — Assessment & Plan Note (Signed)
She does not seem focused on getting her weight under control and may eventually need to discuss bariatric intervention.

## 2017-12-07 NOTE — Progress Notes (Signed)
HPI female never smoker followed for OSA, asthma/bronchitis, complicated by GERD, allergic rhinitis, anxiety, Thoracic Outlet Syndrome NPSG 04/20/13- AHI 10/ hr. CPAP titration 06/10/13> 11 cwp/ hr w/ F/P Simplus mask. Epworth6/24, BMI 53. Office spirometry 04/07/14- WNL- FVC 2.84/86%, FEV1 2.32/88%, FEV1/FVC 0.82, FEF 25-75% 2.48/94%   --------------------------------------------------------------------------------------------------  73/68- 62 year old female never smoker followed for OSA, asthma/bronchitis, complicated by GERD, allergic rhinitis, anxiety, Thoracic Outlet Syndrome CPAP auto 5-15/ [facility] FOLLOW UP [facility], NEEDS A NEW A NEW RX for Ventolin, DL attached. Pt needs new supplies as well. Weight today 322 pounds 43 months is living in a friend's home while her own apartment is refurbished. The friend has multiple animals including dogs, birds and lizards. So far there has not been significant asthma flare. She is going to physical therapy 3 days a week and uses her nebulizer as pretreatment before each visit. Infrequent need for rescue inhaler and not waking at night with wheezing. No major exacerbations. Added Benadryl at night/Claritin in the morning for mild spring rhinitis. Download confirms her report of excellent CPAP compliance 100%/4 hours, AHI 0.6/hour. She sleeps much better with CPAP and intends to continue.  12/07/2017- 62 year old female never smoker followed for OSA, asthma/bronchitis, complicated by GERD, allergic rhinitis, anxiety, Thoracic Outlet Syndrome CPAP auto 11-15/ [facility] ----OSA; DME: [facility] Pt wears CPAP nightly and DL attached.  Will need order for new supplies. Weight Today 311 pounds Managing spring seasonal allergy with Claritin in the morning, Benadryl at night. Controlling asthma with occasional albuterol rescue inhaler and occasional use of nebulizer albuterol.  Had discomforts associated with Advair and Breo, prefers not to use these maintenance  controllers now. Doing well with CPAP.  Download 97% compliance AHI 0.6/hour.  Says she sleeps best in the morning after she takes it off, goes to bathroom and gets back to bed. Blames leg pain for limited activity and weight gain.  ROS-see HPI   + = positive Constitutional:   No-   weight loss, night sweats, fevers, chills, fatigue, lassitude. HEENT:   + headaches, +difficulty swallowing, +tooth/dental problems, sore throat,       No-  sneezing, itching, ear ache, +nasal congestion, +post nasal drip,  CV:  No-   chest pain, orthopnea, PND, swelling in lower extremities, anasarca, dizziness, +palpitations Resp: + shortness of breath with exertion or at rest.              No-productive cough,  + non-productive cough,  No- coughing up of blood.              No-   change in color of mucus.  + wheezing.   Skin: no-rash  GI: + heartburn, indigestion, No- abdominal pain, nausea, vomiting,  GU:  MS:  + joint pain or swelling.  . Neuro-    Episodes of? Word agnosia Psych:  No- change in mood or affect. + depression or +anxiety.  No memory loss.  OBJ- Physical Exam General- Alert, Oriented, Affect-appropriate, Distress- none acute. + Morbidly obese Skin- rash-none, lesions- none, excoriation- none Lymphadenopathy- none Head- atraumatic            Eyes- Gross vision intact, PERRLA, conjunctivae and secretions clear            Ears- Hearing, canals-normal            Nose- Clear, no-Septal dev, mucus, polyps, erosion, perforation             Throat- Mallampati III , mucosa clear , drainage- none, tonsils+  present Neck- flexible , trachea midline, no stridor , thyroid nl, carotid- no bruit Chest - symmetrical excursion , unlabored           Heart/CV- RRR , no murmur , no gallop  , no rub, nl s1 s2                           - JVD- none , edema- none, stasis changes- none, varices- none           Lung- clear to P&A, wheeze -none cough- none , dullness-none, rub- none,            Chest wall-  Abd-   Br/ Gen/ Rectal- Not done, not indicated Extrem- cyanosis- none, clubbing, none, atrophy- none, strength- nl Neuro- grossly intact to observation, speech is articulate

## 2017-12-07 NOTE — Assessment & Plan Note (Signed)
Mild intermittent uncomplicated. Plan-refill albuterol inhaler.  Discussed environmental protections.

## 2017-12-07 NOTE — Patient Instructions (Signed)
Script printed to refill your albuterol hfa rescue inhaler  Script printed to continue CPAP auto 11-15, mask of choice, refill supplies, humidifier, AirView

## 2018-03-19 ENCOUNTER — Ambulatory Visit: Payer: Medicare Other | Admitting: Rheumatology

## 2018-06-10 ENCOUNTER — Other Ambulatory Visit: Payer: Self-pay

## 2018-06-10 ENCOUNTER — Ambulatory Visit: Payer: Medicare Other | Attending: Internal Medicine | Admitting: Physical Therapy

## 2018-06-10 ENCOUNTER — Encounter: Payer: Self-pay | Admitting: Physical Therapy

## 2018-06-10 DIAGNOSIS — M25652 Stiffness of left hip, not elsewhere classified: Secondary | ICD-10-CM | POA: Insufficient documentation

## 2018-06-10 DIAGNOSIS — R279 Unspecified lack of coordination: Secondary | ICD-10-CM | POA: Diagnosis present

## 2018-06-10 DIAGNOSIS — M25552 Pain in left hip: Secondary | ICD-10-CM | POA: Diagnosis present

## 2018-06-10 DIAGNOSIS — M6281 Muscle weakness (generalized): Secondary | ICD-10-CM | POA: Diagnosis present

## 2018-06-10 NOTE — Therapy (Signed)
[facility] [address], [place], [place], [address] Phone: [phone]   Fax:  [phone]  Physical Therapy Evaluation  Patient Details  Name: RAMYA VANBERGEN MRN: [id number] Date of Birth: 04-May-1956 Referring Provider (PT): Dr. Jerene Bears   Encounter Date: 06/10/2018  PT End of Session - 06/10/18 1008    Visit Number  1    Date for PT Re-Evaluation  08/05/18    Authorization Type  [employer]    PT Start Time  0930    PT Stop Time  1015    PT Time Calculation (min)  45 min    Activity Tolerance  Patient tolerated treatment well    Behavior During Therapy  [facility] for tasks assessed/performed       Past Medical History:  Diagnosis Date  . Arm fracture    right arm   . Asthma   . Bulging lumbar disc   . Complication of anesthesia    pt sts, "I am a very shallow breather and they have to do something special for me"/  . COPD (chronic obstructive pulmonary disease) ([place])   . Degenerative joint disease (DJD) of hip   . DJD (degenerative joint disease) of hip   . Elevated LFTs   . Hip pain   . Hypothyroidism   . Insomnia   . Nerve damage    right arm.  Marland Kitchen PTSD (post-traumatic stress disorder)   . Sleep apnea    has CPAP  . Thyroid disease     Past Surgical History:  Procedure Laterality Date  . COLONOSCOPY N/A 08/12/2014   Procedure: COLONOSCOPY;  Surgeon: Daneil Dolin, MD;  Location: [facility];  Service: Endoscopy;  Laterality: N/A;  115  . thumb surgery    . UMBILICAL HERNIA REPAIR N/A 12/30/2014   Procedure: HERNIA REPAIR UMBILICAL ADULT WITH MESH;  Surgeon: Aviva Signs Md, MD;  Location: [facility];  Service: General;  Laterality: N/A;    There were no vitals filed for this visit.   Subjective Assessment - 06/10/18 0936    Subjective  Patient had left leg pain in 2016. Patient had a piece of the exercise equipment that went into the vagina. 7035 had umbilicus hernia and surgeon may have nicked the femoral  nerve. Patient reports if she flares up the nerve she will feel sick and may have to go to the hodpital. After past PT or walking in [place] patient lower half stopped working. Patient reports no numbness or tingling in legs.  MD wants her to have a hip replacement on the left.     How long can you sit comfortably?  sometimes has to use the walker at home    How long can you walk comfortably?  10-15 minutes with cane    Patient Stated Goals  be able to not having to use the cane, walk without deviations,     Currently in Pain?  Yes    Pain Score  3    worse 10/10   Pain Location  Groin    Pain Orientation  Left    Pain Descriptors / Indicators  Dull;Burning    Pain Type  Chronic pain    Pain Onset  More than a month ago    Pain Frequency  Constant    Aggravating Factors   sleep on back for 20 minutes,  walking, standing, steps or curb    Pain Relieving Factors  lay on stomach,     Multiple  Pain Sites  No         [facility] PT Assessment - 06/10/18 0001      Assessment   Medical Diagnosis  Leg and hip pain left    Referring Provider (PT)  Dr. Jerene Bears    Onset Date/Surgical Date  08/07/14    Prior Therapy  yes 3 sessions      Precautions   Precautions  None      Restrictions   Weight Bearing Restrictions  No      Balance Screen   Has the patient fallen in the past 6 months  No    Has the patient had a decrease in activity level because of a fear of falling?   No    Is the patient reluctant to leave their home because of a fear of falling?   No      Home Film/video editor residence      Prior Function   Level of Independence  Independent with basic ADLs    Vocation  On disability    Leisure  2-87minutes on bike       Cognition   Overall Cognitive Status  Within Functional Limits for tasks assessed      Posture/Postural Control   Posture/Postural Control  Postural limitations    Postural Limitations  Rounded Shoulders;Forward head;Decreased lumbar  lordosis;Flexed trunk      ROM / Strength   AROM / PROM / Strength  AROM;PROM;Strength      AROM   Lumbar Extension  decreased by 75%    Lumbar - Right Side Bend  decreased by 25%    Lumbar - Left Side Bend  full      PROM   Right Hip External Rotation   40    Left Hip Extension  -10    Left Hip Flexion  80    Left Hip External Rotation   40    Left Hip Internal Rotation   0    Left Hip ABduction  0      Strength   Left Hip Flexion  3+/5    Left Hip Extension  3-/5    Left Hip ABduction  2/5    Left Hip ADduction  2/5    Right Knee Extension  3+/5    Left Knee Extension  3+/5                Objective measurements completed on examination: See above findings.    Pelvic Floor Special Questions - 06/10/18 0001    Urinary Leakage  Yes    Pad use  4 depends per day    Activities that cause leaking  With strong urge   getting out of a chair   Skin Integrity  Intact   dry   Pelvic Floor Internal Exam  Patient confirms identification and approves PT to assess pelvic floor muscles and treatment    Exam Type  Vaginal    Palpation  tenderness located in left obturator internist and levator ani    Strength  good squeeze, good lift, able to hold agaisnt strong resistance                 PT Short Term Goals - 06/10/18 1202      PT SHORT TERM GOAL #1   Title  independent with with initial HEP    Time  4    Period  Weeks    Status  New    Target  Date  07/08/18      PT SHORT TERM GOAL #2   Title  pain in left hip decreased >/= 25% due to improved joint and tissue mobility    Time  4    Period  Weeks    Status  New    Target Date  07/08/18      PT SHORT TERM GOAL #3   Title  ability to get up from the commode with >/= 25%  greater ease due to increased strength and mobility    Time  4    Period  Weeks    Status  New    Target Date  07/08/18      PT SHORT TERM GOAL #4   Title  urinary leakage decreased >/= 25% due to improved pelvic floor  coordination    Time  4    Period  Weeks    Status  New    Target Date  07/08/18        PT Long Term Goals - 06/10/18 1204      PT LONG TERM GOAL #1   Title  independent with HEP and understand how to progress herself    Time  8    Period  Weeks    Status  New    Target Date  08/05/18      PT LONG TERM GOAL #2   Title  ability to ambulate with single point cane with no increased in pain for 10 minutes due to improved tissue mobility    Time  8    Period  Weeks    Status  New    Target Date  08/05/18      PT LONG TERM GOAL #3   Title  urinary leakage when she has left hip pain decreased >/= 50% due to improved pelvic floor coordination and strength    Time  8    Period  Weeks    Status  New    Target Date  08/05/18      PT LONG TERM GOAL #4   Title  lay on back with >/= 25% decreased in pain due to left hip at 0 degrees P/ROM    Time  8    Period  Weeks    Status  New    Target Date  08/05/18             Plan - 06/10/18 1017    Clinical Impression Statement  Patient is a 62 year old female with complex medical hystory.  Patient reports chronic pain at level 3/10 but will spike up to 10/10 with movement, walking, standing, laying on her back.  When patient has increased pain she will leak uring and wears 4 depends per day.  Patient has difficulty with walking to the commode and getting off commode at times. Patient ambulates with a flexed posture, limp on left, increased trunk sway, decreased step length.  Patient ambulates with a single point cane in the community but will use a rolling walker at home. Patient is very slow with going from sit to stand and stays in a flexed posture for 1 minute before she is able to straighten up.  Patient has decreased mobility of left hip by 50%. Patient left hip stength is 2/5 and left knee extension is 3+/5.  Pelvic floor strength is 4/5 with palpable tenderness located on the left levator ani and obturator internist and left groin.   Patient will benefit from skilled therapy to improve  mobility, reduce pain, reduce urinary leakage and improve gait.     History and Personal Factors relevant to plan of care:  COPD; PTSD; Thyroid disease; Umbilical hernia repain; DJD of left hip; nerve damage    Clinical Presentation  Evolving    Clinical Presentation due to:  gets stuck on the toilet, trouble ambulating to the commode in time due to hip pain, has to use a rolling walker at home    Clinical Decision Making  Moderate    Rehab Potential  Good    Clinical Impairments Affecting Rehab Potential  COPD; PTSD; Thyroid disease; Umbilical hernia repain; DJD of left hip; nerve damage    PT Frequency  2x / week    PT Duration  8 weeks    PT Treatment/Interventions  Biofeedback;Cryotherapy;Electrical Stimulation;Iontophoresis 4mg /ml Dexamethasone;Moist Heat;Ultrasound;Therapeutic exercise;Therapeutic activities;Gait training;Neuromuscular re-education;Patient/family education;Manual techniques;Passive range of motion;Dry needling    PT Next Visit Plan  left hip movilization; soft tissue work to the pelvic floor, hip stretches; core strength    Consulted and Agree with Plan of Care  Patient       Patient will benefit from skilled therapeutic intervention in order to improve the following deficits and impairments:  Abnormal gait, Increased fascial restricitons, Pain, Decreased coordination, Increased muscle spasms, Decreased activity tolerance, Decreased endurance, Decreased range of motion, Decreased strength, Difficulty walking  Visit Diagnosis: Pain in left hip - Plan: PT plan of care cert/re-cert  Stiffness of left hip, not elsewhere classified - Plan: PT plan of care cert/re-cert  Muscle weakness (generalized) - Plan: PT plan of care cert/re-cert  Unspecified lack of coordination - Plan: PT plan of care cert/re-cert     Problem List Patient Active Problem List   Diagnosis Date Noted  . Positive ANA nucleolar, positive dsDNA  04/15/2017  . Tendinopathy of right shoulder 04/15/2017  . Primary osteoarthritis of left hip 04/15/2017  . Obesity, morbid ([place]) 12/06/2016  . Asthma with bronchitis 09/21/2014  . History of colonic polyps   . Diverticulosis of colon without hemorrhage   . Thoracic outlet syndrome 05/07/2014  . Dyspnea 04/13/2014  . Obstructive sleep apnea 07/30/2013  . Seasonal and perennial allergic rhinitis 07/30/2013  . PTSD (post-traumatic stress disorder) 01/13/2013  . Anxiety state 01/13/2013  . Insomnia secondary to depression with anxiety 01/13/2013  . Arm pain 01/13/2013    Earlie Counts, PT 06/10/18 12:10 PM   [facility] [address] [place], [place], [address] Phone: [phone]   Fax:  [phone]  Name: LIESA TSAN MRN: [id number] Date of Birth: [birth year]

## 2018-06-18 ENCOUNTER — Encounter: Payer: Self-pay | Admitting: Physical Therapy

## 2018-06-25 ENCOUNTER — Encounter: Payer: Self-pay | Admitting: Physical Therapy

## 2018-06-27 ENCOUNTER — Encounter: Payer: Self-pay | Admitting: Physical Therapy

## 2018-07-09 ENCOUNTER — Encounter: Payer: Self-pay | Admitting: Physical Therapy

## 2018-07-11 ENCOUNTER — Encounter: Payer: Self-pay | Admitting: Physical Therapy

## 2018-07-15 ENCOUNTER — Encounter: Payer: Self-pay | Admitting: Physical Therapy

## 2018-07-17 ENCOUNTER — Encounter: Payer: Self-pay | Admitting: Physical Therapy

## 2018-07-22 ENCOUNTER — Encounter: Payer: Self-pay | Admitting: Physical Therapy

## 2018-07-24 ENCOUNTER — Encounter: Payer: Self-pay | Admitting: Physical Therapy

## 2018-07-29 ENCOUNTER — Encounter: Payer: Self-pay | Admitting: Physical Therapy

## 2018-08-01 ENCOUNTER — Encounter: Payer: Self-pay | Admitting: Physical Therapy

## 2018-12-10 ENCOUNTER — Ambulatory Visit: Payer: Self-pay | Admitting: Internal Medicine

## 2019-03-10 IMAGING — DX DG HIP (WITH OR WITHOUT PELVIS) 2-3V*L*
3 series · 3 of 3 positions shown · non-contrast
Comparison: None.

CLINICAL DATA: Left hip pain.

EXAM:
DG HIP (WITH OR WITHOUT PELVIS) 2-3V LEFT

[pelvis ap]
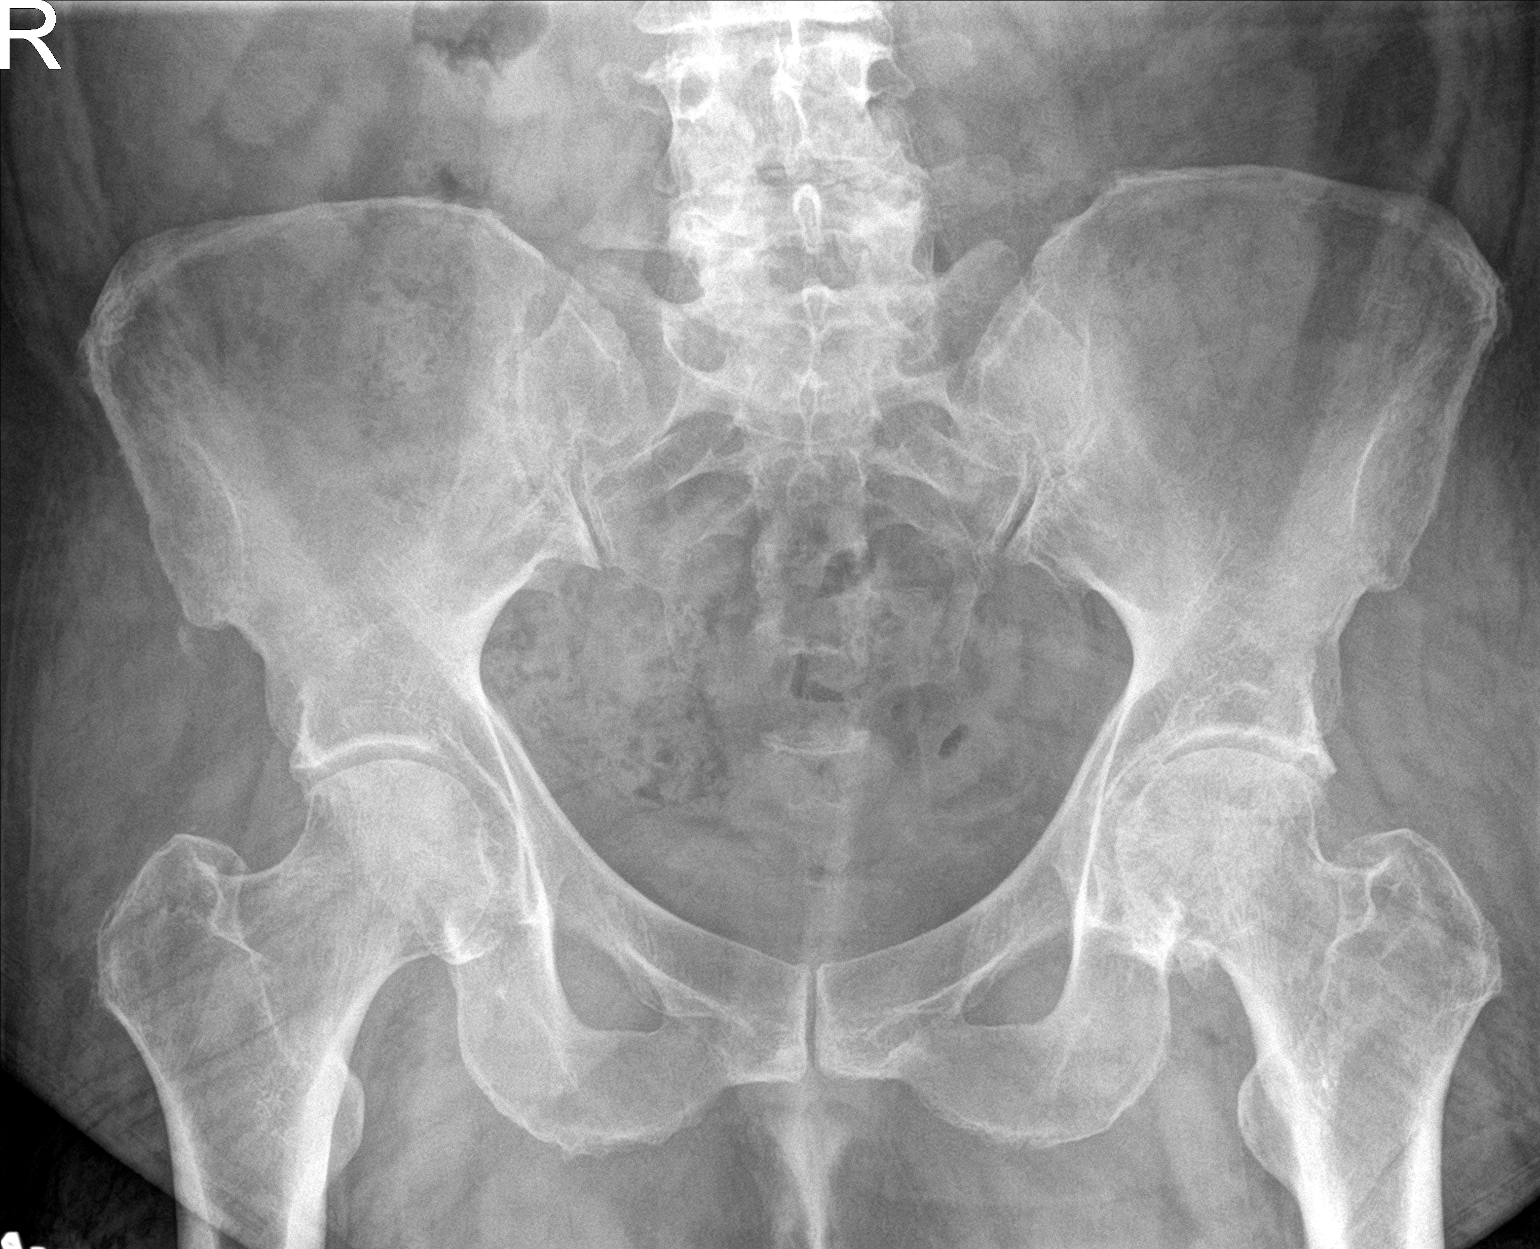

[hip ap]
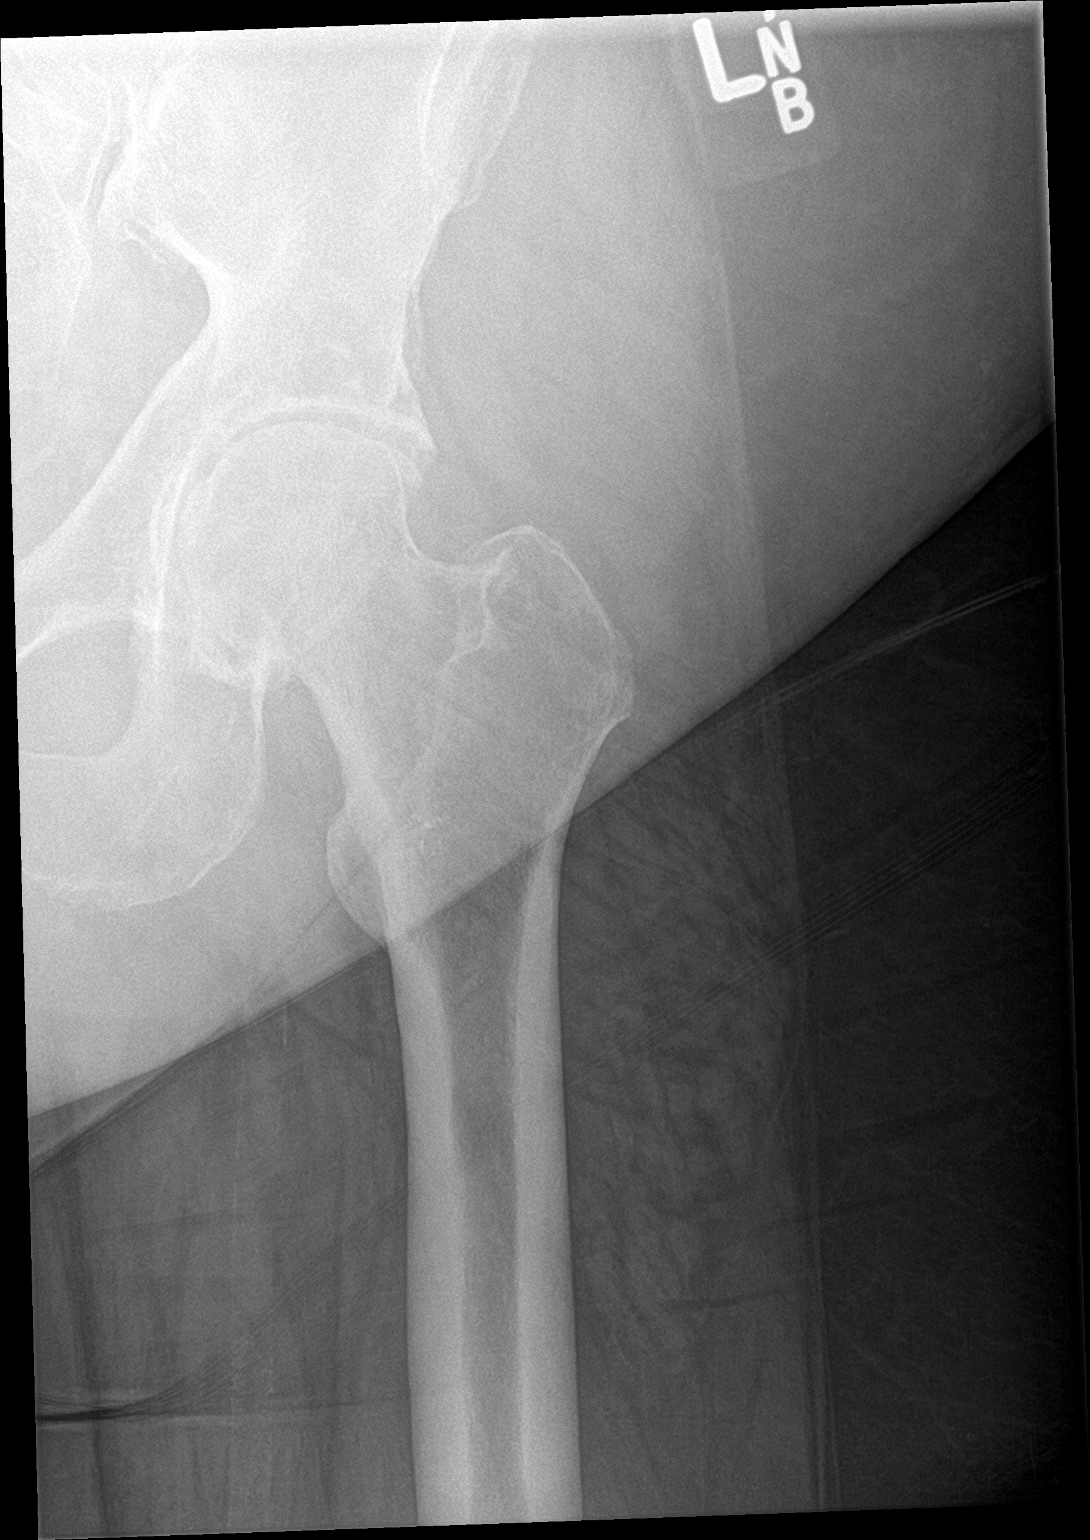

[hip lat]
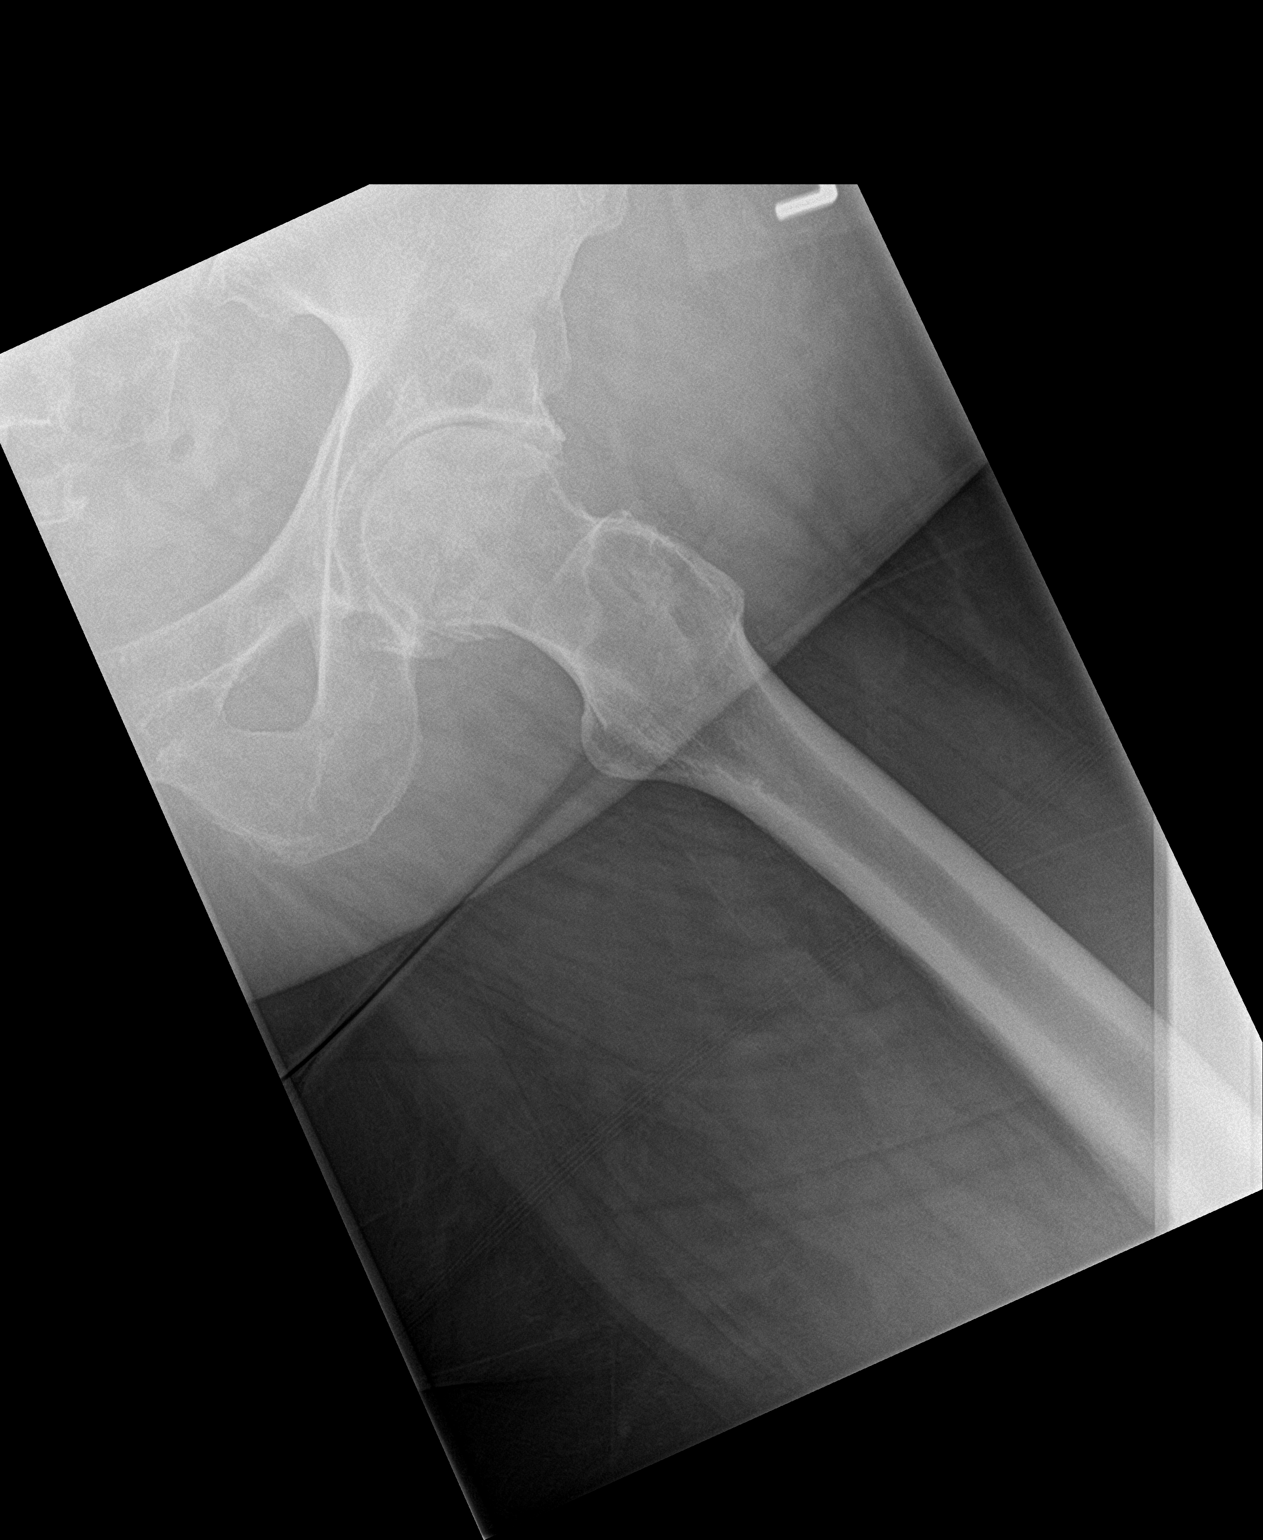

[3 of 3 positions shown; findings below may reference images not displayed]

FINDINGS: No acute fracture or dislocation. No lytic or sclerotic osseous
lesion. Moderate osteoarthritis of the left hip with joint space
narrowing and marginal osteophytes. Mild osteoarthritis of the right
hip.
IMPRESSION: Moderate osteoarthritis of the left hip.

## 2019-08-27 DIAGNOSIS — J449 Chronic obstructive pulmonary disease, unspecified: Secondary | ICD-10-CM | POA: Diagnosis not present

## 2019-11-03 DIAGNOSIS — Z79899 Other long term (current) drug therapy: Secondary | ICD-10-CM | POA: Diagnosis not present

## 2019-11-03 DIAGNOSIS — Z299 Encounter for prophylactic measures, unspecified: Secondary | ICD-10-CM | POA: Diagnosis not present

## 2019-11-03 DIAGNOSIS — Z7189 Other specified counseling: Secondary | ICD-10-CM | POA: Diagnosis not present

## 2019-11-03 DIAGNOSIS — R5383 Other fatigue: Secondary | ICD-10-CM | POA: Diagnosis not present

## 2019-11-03 DIAGNOSIS — Z1211 Encounter for screening for malignant neoplasm of colon: Secondary | ICD-10-CM | POA: Diagnosis not present

## 2019-11-03 DIAGNOSIS — Z0000 Encounter for general adult medical examination without abnormal findings: Secondary | ICD-10-CM | POA: Diagnosis not present

## 2019-11-03 DIAGNOSIS — Z789 Other specified health status: Secondary | ICD-10-CM | POA: Diagnosis not present

## 2019-11-03 DIAGNOSIS — E039 Hypothyroidism, unspecified: Secondary | ICD-10-CM | POA: Diagnosis not present

## 2020-02-04 DIAGNOSIS — J449 Chronic obstructive pulmonary disease, unspecified: Secondary | ICD-10-CM | POA: Diagnosis not present

## 2020-02-26 DIAGNOSIS — Z299 Encounter for prophylactic measures, unspecified: Secondary | ICD-10-CM | POA: Diagnosis not present

## 2020-02-26 DIAGNOSIS — E039 Hypothyroidism, unspecified: Secondary | ICD-10-CM | POA: Diagnosis not present

## 2020-02-26 DIAGNOSIS — J449 Chronic obstructive pulmonary disease, unspecified: Secondary | ICD-10-CM | POA: Diagnosis not present

## 2020-02-26 DIAGNOSIS — N183 Chronic kidney disease, stage 3 unspecified: Secondary | ICD-10-CM | POA: Diagnosis not present

## 2020-02-26 DIAGNOSIS — I739 Peripheral vascular disease, unspecified: Secondary | ICD-10-CM | POA: Diagnosis not present

## 2020-03-05 DIAGNOSIS — J449 Chronic obstructive pulmonary disease, unspecified: Secondary | ICD-10-CM | POA: Diagnosis not present

## 2020-03-30 DIAGNOSIS — J449 Chronic obstructive pulmonary disease, unspecified: Secondary | ICD-10-CM | POA: Diagnosis not present

## 2020-05-06 DIAGNOSIS — E039 Hypothyroidism, unspecified: Secondary | ICD-10-CM | POA: Diagnosis not present

## 2020-05-06 DIAGNOSIS — J449 Chronic obstructive pulmonary disease, unspecified: Secondary | ICD-10-CM | POA: Diagnosis not present

## 2020-06-04 DIAGNOSIS — J449 Chronic obstructive pulmonary disease, unspecified: Secondary | ICD-10-CM | POA: Diagnosis not present

## 2020-06-04 DIAGNOSIS — E039 Hypothyroidism, unspecified: Secondary | ICD-10-CM | POA: Diagnosis not present

## 2020-07-06 DIAGNOSIS — J449 Chronic obstructive pulmonary disease, unspecified: Secondary | ICD-10-CM | POA: Diagnosis not present

## 2020-07-09 ENCOUNTER — Other Ambulatory Visit: Payer: Self-pay | Admitting: Internal Medicine

## 2020-07-09 DIAGNOSIS — Z1231 Encounter for screening mammogram for malignant neoplasm of breast: Secondary | ICD-10-CM

## 2020-07-12 ENCOUNTER — Ambulatory Visit
Admission: RE | Admit: 2020-07-12 | Discharge: 2020-07-12 | Disposition: A | Discharge status: Home / Self Care | Payer: Medicare Other | Source: Ambulatory Visit | Attending: Internal Medicine | Admitting: Internal Medicine

## 2020-07-12 DIAGNOSIS — Z1231 Encounter for screening mammogram for malignant neoplasm of breast: Secondary | ICD-10-CM

## 2020-08-05 DIAGNOSIS — J449 Chronic obstructive pulmonary disease, unspecified: Secondary | ICD-10-CM | POA: Diagnosis not present

## 2020-11-05 DIAGNOSIS — E559 Vitamin D deficiency, unspecified: Secondary | ICD-10-CM | POA: Diagnosis not present

## 2020-11-05 DIAGNOSIS — R5383 Other fatigue: Secondary | ICD-10-CM | POA: Diagnosis not present

## 2020-11-05 DIAGNOSIS — Z789 Other specified health status: Secondary | ICD-10-CM | POA: Diagnosis not present

## 2020-11-05 DIAGNOSIS — Z0000 Encounter for general adult medical examination without abnormal findings: Secondary | ICD-10-CM | POA: Diagnosis not present

## 2020-11-05 DIAGNOSIS — Z299 Encounter for prophylactic measures, unspecified: Secondary | ICD-10-CM | POA: Diagnosis not present

## 2020-11-05 DIAGNOSIS — E7800 Pure hypercholesterolemia, unspecified: Secondary | ICD-10-CM | POA: Diagnosis not present

## 2020-11-05 DIAGNOSIS — Z79899 Other long term (current) drug therapy: Secondary | ICD-10-CM | POA: Diagnosis not present

## 2020-11-05 DIAGNOSIS — Z7189 Other specified counseling: Secondary | ICD-10-CM | POA: Diagnosis not present

## 2020-11-05 DIAGNOSIS — E039 Hypothyroidism, unspecified: Secondary | ICD-10-CM | POA: Diagnosis not present

## 2021-02-11 DIAGNOSIS — H524 Presbyopia: Secondary | ICD-10-CM | POA: Diagnosis not present

## 2021-02-11 DIAGNOSIS — H25813 Combined forms of age-related cataract, bilateral: Secondary | ICD-10-CM | POA: Diagnosis not present

## 2021-03-09 DIAGNOSIS — Z299 Encounter for prophylactic measures, unspecified: Secondary | ICD-10-CM | POA: Diagnosis not present

## 2021-03-09 DIAGNOSIS — E039 Hypothyroidism, unspecified: Secondary | ICD-10-CM | POA: Diagnosis not present

## 2021-03-09 DIAGNOSIS — J449 Chronic obstructive pulmonary disease, unspecified: Secondary | ICD-10-CM | POA: Diagnosis not present

## 2021-03-09 DIAGNOSIS — I739 Peripheral vascular disease, unspecified: Secondary | ICD-10-CM | POA: Diagnosis not present

## 2021-04-04 DIAGNOSIS — H5213 Myopia, bilateral: Secondary | ICD-10-CM | POA: Diagnosis not present

## 2021-04-04 DIAGNOSIS — H40002 Preglaucoma, unspecified, left eye: Secondary | ICD-10-CM | POA: Diagnosis not present

## 2021-05-11 DIAGNOSIS — E039 Hypothyroidism, unspecified: Secondary | ICD-10-CM | POA: Diagnosis not present

## 2021-05-11 DIAGNOSIS — J309 Allergic rhinitis, unspecified: Secondary | ICD-10-CM | POA: Diagnosis not present

## 2021-05-11 DIAGNOSIS — D692 Other nonthrombocytopenic purpura: Secondary | ICD-10-CM | POA: Diagnosis not present

## 2021-05-11 DIAGNOSIS — R300 Dysuria: Secondary | ICD-10-CM | POA: Diagnosis not present

## 2021-05-11 DIAGNOSIS — Z299 Encounter for prophylactic measures, unspecified: Secondary | ICD-10-CM | POA: Diagnosis not present

## 2021-08-18 DIAGNOSIS — Z299 Encounter for prophylactic measures, unspecified: Secondary | ICD-10-CM | POA: Diagnosis not present

## 2021-08-18 DIAGNOSIS — I739 Peripheral vascular disease, unspecified: Secondary | ICD-10-CM | POA: Diagnosis not present

## 2021-08-18 DIAGNOSIS — J449 Chronic obstructive pulmonary disease, unspecified: Secondary | ICD-10-CM | POA: Diagnosis not present

## 2021-08-18 DIAGNOSIS — E039 Hypothyroidism, unspecified: Secondary | ICD-10-CM | POA: Diagnosis not present

## 2021-08-18 DIAGNOSIS — Z789 Other specified health status: Secondary | ICD-10-CM | POA: Diagnosis not present

## 2021-08-18 DIAGNOSIS — R079 Chest pain, unspecified: Secondary | ICD-10-CM | POA: Diagnosis not present

## 2021-08-22 ENCOUNTER — Encounter: Payer: Self-pay | Admitting: *Deleted

## 2021-08-29 DIAGNOSIS — R0602 Shortness of breath: Secondary | ICD-10-CM | POA: Diagnosis not present

## 2021-08-29 DIAGNOSIS — R079 Chest pain, unspecified: Secondary | ICD-10-CM | POA: Diagnosis not present

## 2021-08-30 DIAGNOSIS — R079 Chest pain, unspecified: Secondary | ICD-10-CM | POA: Diagnosis not present

## 2021-10-04 DIAGNOSIS — H40011 Open angle with borderline findings, low risk, right eye: Secondary | ICD-10-CM | POA: Diagnosis not present

## 2021-11-07 DIAGNOSIS — Z7189 Other specified counseling: Secondary | ICD-10-CM | POA: Diagnosis not present

## 2021-11-07 DIAGNOSIS — M79606 Pain in leg, unspecified: Secondary | ICD-10-CM | POA: Diagnosis not present

## 2021-11-07 DIAGNOSIS — N183 Chronic kidney disease, stage 3 unspecified: Secondary | ICD-10-CM | POA: Diagnosis not present

## 2021-11-07 DIAGNOSIS — Z789 Other specified health status: Secondary | ICD-10-CM | POA: Diagnosis not present

## 2021-11-07 DIAGNOSIS — E039 Hypothyroidism, unspecified: Secondary | ICD-10-CM | POA: Diagnosis not present

## 2021-11-07 DIAGNOSIS — Z79899 Other long term (current) drug therapy: Secondary | ICD-10-CM | POA: Diagnosis not present

## 2021-11-07 DIAGNOSIS — Z299 Encounter for prophylactic measures, unspecified: Secondary | ICD-10-CM | POA: Diagnosis not present

## 2021-11-07 DIAGNOSIS — Z0000 Encounter for general adult medical examination without abnormal findings: Secondary | ICD-10-CM | POA: Diagnosis not present

## 2021-11-16 DIAGNOSIS — M62830 Muscle spasm of back: Secondary | ICD-10-CM | POA: Diagnosis not present

## 2021-11-16 DIAGNOSIS — M9901 Segmental and somatic dysfunction of cervical region: Secondary | ICD-10-CM | POA: Diagnosis not present

## 2021-11-16 DIAGNOSIS — M9902 Segmental and somatic dysfunction of thoracic region: Secondary | ICD-10-CM | POA: Diagnosis not present

## 2021-11-16 DIAGNOSIS — M9903 Segmental and somatic dysfunction of lumbar region: Secondary | ICD-10-CM | POA: Diagnosis not present

## 2021-11-16 DIAGNOSIS — M546 Pain in thoracic spine: Secondary | ICD-10-CM | POA: Diagnosis not present

## 2021-11-30 DIAGNOSIS — M9903 Segmental and somatic dysfunction of lumbar region: Secondary | ICD-10-CM | POA: Diagnosis not present

## 2021-11-30 DIAGNOSIS — M9902 Segmental and somatic dysfunction of thoracic region: Secondary | ICD-10-CM | POA: Diagnosis not present

## 2021-11-30 DIAGNOSIS — M9901 Segmental and somatic dysfunction of cervical region: Secondary | ICD-10-CM | POA: Diagnosis not present

## 2021-11-30 DIAGNOSIS — M546 Pain in thoracic spine: Secondary | ICD-10-CM | POA: Diagnosis not present

## 2021-11-30 DIAGNOSIS — M62830 Muscle spasm of back: Secondary | ICD-10-CM | POA: Diagnosis not present

## 2021-12-14 DIAGNOSIS — M9902 Segmental and somatic dysfunction of thoracic region: Secondary | ICD-10-CM | POA: Diagnosis not present

## 2021-12-14 DIAGNOSIS — M9903 Segmental and somatic dysfunction of lumbar region: Secondary | ICD-10-CM | POA: Diagnosis not present

## 2021-12-14 DIAGNOSIS — M546 Pain in thoracic spine: Secondary | ICD-10-CM | POA: Diagnosis not present

## 2021-12-14 DIAGNOSIS — M9901 Segmental and somatic dysfunction of cervical region: Secondary | ICD-10-CM | POA: Diagnosis not present

## 2021-12-14 DIAGNOSIS — M62830 Muscle spasm of back: Secondary | ICD-10-CM | POA: Diagnosis not present

## 2021-12-22 DIAGNOSIS — R5383 Other fatigue: Secondary | ICD-10-CM | POA: Diagnosis not present

## 2021-12-22 DIAGNOSIS — Z79899 Other long term (current) drug therapy: Secondary | ICD-10-CM | POA: Diagnosis not present

## 2021-12-22 DIAGNOSIS — E039 Hypothyroidism, unspecified: Secondary | ICD-10-CM | POA: Diagnosis not present

## 2021-12-22 DIAGNOSIS — R52 Pain, unspecified: Secondary | ICD-10-CM | POA: Diagnosis not present

## 2021-12-22 DIAGNOSIS — Z299 Encounter for prophylactic measures, unspecified: Secondary | ICD-10-CM | POA: Diagnosis not present

## 2021-12-22 DIAGNOSIS — E7800 Pure hypercholesterolemia, unspecified: Secondary | ICD-10-CM | POA: Diagnosis not present

## 2021-12-22 DIAGNOSIS — Z0000 Encounter for general adult medical examination without abnormal findings: Secondary | ICD-10-CM | POA: Diagnosis not present

## 2022-01-06 ENCOUNTER — Encounter: Payer: Self-pay | Admitting: Allergy & Immunology

## 2022-01-06 ENCOUNTER — Ambulatory Visit (INDEPENDENT_AMBULATORY_CARE_PROVIDER_SITE_OTHER): Payer: Medicare Other | Admitting: Allergy & Immunology

## 2022-01-06 VITALS — BP 156/84 | HR 81 | Temp 97.9°F | Resp 18 | Ht 64.5 in | Wt 291.0 lb

## 2022-01-06 DIAGNOSIS — J310 Chronic rhinitis: Secondary | ICD-10-CM

## 2022-01-06 DIAGNOSIS — J4520 Mild intermittent asthma, uncomplicated: Secondary | ICD-10-CM | POA: Diagnosis not present

## 2022-01-06 DIAGNOSIS — Z889 Allergy status to unspecified drugs, medicaments and biological substances status: Secondary | ICD-10-CM

## 2022-01-06 DIAGNOSIS — T7840XD Allergy, unspecified, subsequent encounter: Secondary | ICD-10-CM

## 2022-01-06 DIAGNOSIS — Z560 Unemployment, unspecified: Secondary | ICD-10-CM

## 2022-01-06 DIAGNOSIS — E7800 Pure hypercholesterolemia, unspecified: Secondary | ICD-10-CM

## 2022-01-06 DIAGNOSIS — R768 Other specified abnormal immunological findings in serum: Secondary | ICD-10-CM

## 2022-01-06 DIAGNOSIS — F4310 Post-traumatic stress disorder, unspecified: Secondary | ICD-10-CM
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
## 2022-01-06 NOTE — Patient Instructions (Addendum)
1. Allergic reaction - Testing was negative to the entire environmental and food panel. - I would just avoid all of your triggering foods. - Continue with a low histamine diet. - I am going to get an environmental allergy panel to confirm this.   2. Concern for mast cell activation syndrome - We are going to get some limited lab work including tryptase, prostaglandin D2 in the urine, urine methyl histamine, plasma metanephrines, C-kit mutation, chromogranin A, catecholamines in the urine, as well as a 24-hour urine histamine.  - I would restart antihistamines that you are able to tolerate. - Consider starting hydroxyzine 27m at night (this can cause drowsiness). - Consider starting Xolair for mast cell stabilization to help. - Information on Xolair provided today. - I will read through your novel this weekend.  - We need to connect some dots!   3. Return in about 3 months (around 04/08/2022).    Please inform [place] any Emergency Department visits, hospitalizations, or changes in symptoms. Call u[place]abefore going to the ED for breathing or allergy symptoms since we might be able to fit you in for a sick visit. Feel free to contact u[place]anytime with any questions, problems, or concerns.  It was a pleasure to meet you today! You are something else!   Websites that have reliable patient information: 1. American Academy of Asthma, Allergy, and Immunology: www.aaaai.org 2. Food Allergy Research and Education (FARE): foodallergy.org 3. Mothers of Asthmatics: http://www.asthmacommunitynetwork.org 4. American College of Allergy, Asthma, and Immunology: www.acaai.org   COVID-19 Vaccine Information can be found at: [email or web address] questions related to vaccine distribution or appointments, please email [email or web address] or call [phone]   We realize that you might be concerned about having an allergic reaction to the COVID19  vaccines. To help with that concern, WE ARE OFFERING THE COVID19 VACCINES IN OUR OFFICE! Ask the front desk for dates!     "Like" u[place]on Facebook and Instagram for our latest updates!      A healthy democracy works best when ANew York Life Insuranceparticipate! Make sure you are registered to vote! If you have moved or changed any of your contact information, you will need to get this updated before voting!  In some cases, you MAY be able to register to vote online: [email or web address]

## 2022-01-11 DIAGNOSIS — M62830 Muscle spasm of back: Secondary | ICD-10-CM | POA: Diagnosis not present

## 2022-01-11 DIAGNOSIS — M9903 Segmental and somatic dysfunction of lumbar region: Secondary | ICD-10-CM | POA: Diagnosis not present

## 2022-01-11 DIAGNOSIS — M9901 Segmental and somatic dysfunction of cervical region: Secondary | ICD-10-CM | POA: Diagnosis not present

## 2022-01-11 DIAGNOSIS — M546 Pain in thoracic spine: Secondary | ICD-10-CM | POA: Diagnosis not present

## 2022-01-11 DIAGNOSIS — M9902 Segmental and somatic dysfunction of thoracic region: Secondary | ICD-10-CM | POA: Diagnosis not present

## 2022-01-12 DIAGNOSIS — T7840XD Allergy, unspecified, subsequent encounter: Secondary | ICD-10-CM | POA: Diagnosis not present

## 2022-01-13 ENCOUNTER — Encounter: Payer: Self-pay | Admitting: Allergy & Immunology

## 2022-01-16 LAB — ALPHA-GAL PANEL
Allergen Lamb IgE: <0.1 kU/L
Beef IgE: <0.1 kU/L
IgE (Immunoglobulin E), Serum: 438 IU/mL (ref 6–495)
O215-IgE Alpha-Gal: <0.1 kU/L
Pork IgE: <0.1 kU/L

## 2022-01-17 LAB — CBC WITH DIFFERENTIAL
Basophils Absolute: 0.1 10*3/uL (ref 0.0–0.2)
Basos: 1 %
EOS (ABSOLUTE): 0.3 10*3/uL (ref 0.0–0.4)
Eos: 4 %
Hematocrit: 44.8 % (ref 34.0–46.6)
Hemoglobin: 15 g/dL (ref 11.1–15.9)
Immature Grans (Abs): 0.1 10*3/uL (ref 0.0–0.1)
Immature Granulocytes: 1 %
Lymphocytes Absolute: 2.6 10*3/uL (ref 0.7–3.1)
Lymphs: 34 %
MCH: 29.2 pg (ref 26.6–33.0)
MCHC: 33.5 g/dL (ref 31.5–35.7)
MCV: 87 fL (ref 79–97)
Monocytes Absolute: 0.5 10*3/uL (ref 0.1–0.9)
Monocytes: 7 %
Neutrophils Absolute: 4.1 10*3/uL (ref 1.4–7.0)
Neutrophils: 53 %
RBC: 5.13 x10E6/uL (ref 3.77–5.28)
RDW: 14.4 % (ref 11.7–15.4)
WBC: 7.6 10*3/uL (ref 3.4–10.8)

## 2022-01-17 LAB — CMP14+EGFR
ALT: 15 IU/L (ref 0–32)
AST: 21 IU/L (ref 0–40)
Albumin/Globulin Ratio: 1.6 (ref 1.2–2.2)
Albumin: 4.4 g/dL (ref 3.8–4.8)
Alkaline Phosphatase: 99 IU/L (ref 44–121)
BUN/Creatinine Ratio: 16 (ref 12–28)
BUN: 15 mg/dL (ref 8–27)
Bilirubin Total: 0.4 mg/dL (ref 0.0–1.2)
CO2: 25 mmol/L (ref 20–29)
Calcium: 9.5 mg/dL (ref 8.7–10.3)
Chloride: 104 mmol/L (ref 96–106)
Creatinine, Ser: 0.92 mg/dL (ref 0.57–1.00)
Globulin, Total: 2.7 g/dL (ref 1.5–4.5)
Glucose: 134 mg/dL — ABNORMAL HIGH (ref 70–99)
Potassium: 4.1 mmol/L (ref 3.5–5.2)
Sodium: 143 mmol/L (ref 134–144)
Total Protein: 7.1 g/dL (ref 6.0–8.5)
eGFR: 70 mL/min/{1.73_m2} (ref >59)

## 2022-01-17 LAB — C-REACTIVE PROTEIN: CRP: 4 mg/L (ref 0–10)

## 2022-01-17 LAB — SEDIMENTATION RATE: Sed Rate: 4 mm/hr (ref 0–40)

## 2022-01-17 LAB — ANTINUCLEAR ANTIBODIES, IFA: ANA Titer 1: NEGATIVE

## 2022-01-18 ENCOUNTER — Telehealth: Payer: Self-pay | Admitting: Allergy & Immunology

## 2022-01-18 NOTE — Telephone Encounter (Signed)
Patient is having issues with her old PCP. Patient would like a recommendation from Dr. Ernst Bowler for a primary physician that has worked well with him.   Best contact number: [phone]  Please advise.

## 2022-01-19 ENCOUNTER — Other Ambulatory Visit: Payer: Self-pay | Admitting: Allergy & Immunology

## 2022-01-19 DIAGNOSIS — T7840XD Allergy, unspecified, subsequent encounter: Secondary | ICD-10-CM | POA: Diagnosis not present

## 2022-01-19 NOTE — Telephone Encounter (Signed)
Called patient - DOB verified - advised of provider notation below.  Patient verbalized understanding, no further questions. 

## 2022-01-19 NOTE — Telephone Encounter (Signed)
We love Dr. Lance Sell and Dr. Michael Boston Cook's office [facility]), Dr. Olivia Canter office, Dr. Joycelyn Schmid Simpson's office [facility]), and Dr. Gerarda Fraction and Dr. Delanna Ahmadi practice [facility]).  Salvatore Marvel, MD [facility] [place] [facility] [place]

## 2022-01-26 DIAGNOSIS — T7840XD Allergy, unspecified, subsequent encounter: Secondary | ICD-10-CM | POA: Diagnosis not present

## 2022-01-30 LAB — CATECHOLAMINE+VMA, 24-HR URINE
Dopamine , 24H Ur: 190 ug/24 hr (ref 0–510)
Dopamine, Rand Ur: 73 ug/L
Epinephrine, 24H Ur: <3 ug/24 hr (ref 0–20)
Epinephrine, Rand Ur: <1 ug/L
Norepinephrine, 24H Ur: 47 ug/24 hr (ref 0–135)
Norepinephrine, Rand Ur: 18 ug/L
VMA, 24H Ur Adult: 3.1 mg/24 hr (ref 0.0–7.5)
VMA, Urine: 1.2 mg/L

## 2022-01-30 LAB — 5 HIAA, QUANTITATIVE, URINE, 24 HOUR
5-HIAA, Ur: 1.2 mg/L
5-HIAA,Quant.,24 Hr Urine: 3.1 mg/24 hr (ref 0.0–14.9)

## 2022-01-31 LAB — C-KIT MUTATION, LIQUID TUMOR

## 2022-01-31 LAB — CHROMOGRANIN A: Chromogranin A (ng/mL): 49.2 ng/mL (ref 0.0–101.8)

## 2022-01-31 LAB — PROSTAGLANDIN D2/CREATININE, U
Creatinine, Urine: 152 mg/dL
Prostaglandin D2, urine: 8.6 pg/mL
Prostaglandin D2/Cr Ratio: 5.7 ng/g

## 2022-01-31 LAB — TRYPTASE: Tryptase: 6.3 ug/L (ref 2.2–13.2)

## 2022-01-31 LAB — METANEPHRINES, PLASMA
Metanephrine, Free: 24.8 pg/mL (ref 0.0–88.0)
Normetanephrine, Free: 101 pg/mL (ref 0.0–285.2)

## 2022-02-04 DIAGNOSIS — E8881 Metabolic syndrome: Secondary | ICD-10-CM | POA: Insufficient documentation

## 2022-02-08 DIAGNOSIS — M546 Pain in thoracic spine: Secondary | ICD-10-CM | POA: Diagnosis not present

## 2022-02-08 DIAGNOSIS — M9903 Segmental and somatic dysfunction of lumbar region: Secondary | ICD-10-CM | POA: Diagnosis not present

## 2022-02-08 DIAGNOSIS — M62830 Muscle spasm of back: Secondary | ICD-10-CM | POA: Diagnosis not present

## 2022-02-08 DIAGNOSIS — M9902 Segmental and somatic dysfunction of thoracic region: Secondary | ICD-10-CM | POA: Diagnosis not present

## 2022-02-08 DIAGNOSIS — M9901 Segmental and somatic dysfunction of cervical region: Secondary | ICD-10-CM | POA: Diagnosis not present

## 2022-03-03 DIAGNOSIS — R739 Hyperglycemia, unspecified: Secondary | ICD-10-CM | POA: Diagnosis not present

## 2022-03-03 DIAGNOSIS — I739 Peripheral vascular disease, unspecified: Secondary | ICD-10-CM | POA: Diagnosis not present

## 2022-03-03 DIAGNOSIS — Z299 Encounter for prophylactic measures, unspecified: Secondary | ICD-10-CM | POA: Diagnosis not present

## 2022-03-03 DIAGNOSIS — N183 Chronic kidney disease, stage 3 unspecified: Secondary | ICD-10-CM | POA: Diagnosis not present

## 2022-03-03 DIAGNOSIS — J449 Chronic obstructive pulmonary disease, unspecified: Secondary | ICD-10-CM | POA: Diagnosis not present

## 2022-03-06 ENCOUNTER — Telehealth: Payer: Self-pay | Admitting: Nurse Practitioner

## 2022-03-06 NOTE — Telephone Encounter (Signed)
Received referral on pt for elevated glucose. Catherine Pigg, NP reviewed referral and said there is no clinical need to be see endocrinology. Referral denied and faxed PCP to make aware

## 2022-03-13 DIAGNOSIS — E039 Hypothyroidism, unspecified: Secondary | ICD-10-CM | POA: Diagnosis not present

## 2022-03-13 DIAGNOSIS — M169 Osteoarthritis of hip, unspecified: Secondary | ICD-10-CM | POA: Diagnosis not present

## 2022-03-13 DIAGNOSIS — J449 Chronic obstructive pulmonary disease, unspecified: Secondary | ICD-10-CM | POA: Diagnosis not present

## 2022-03-13 NOTE — Telephone Encounter (Signed)
Patient called to check on this & made her aware. She said she has a new PCP and will have new information sent here because she does need to see Endocrinology

## 2022-03-15 DIAGNOSIS — M9903 Segmental and somatic dysfunction of lumbar region: Secondary | ICD-10-CM | POA: Diagnosis not present

## 2022-03-15 DIAGNOSIS — M9901 Segmental and somatic dysfunction of cervical region: Secondary | ICD-10-CM | POA: Diagnosis not present

## 2022-03-15 DIAGNOSIS — M546 Pain in thoracic spine: Secondary | ICD-10-CM | POA: Diagnosis not present

## 2022-03-15 DIAGNOSIS — M62830 Muscle spasm of back: Secondary | ICD-10-CM | POA: Diagnosis not present

## 2022-03-15 DIAGNOSIS — M9902 Segmental and somatic dysfunction of thoracic region: Secondary | ICD-10-CM | POA: Diagnosis not present

## 2022-03-17 DIAGNOSIS — R739 Hyperglycemia, unspecified: Secondary | ICD-10-CM | POA: Diagnosis not present

## 2022-03-17 DIAGNOSIS — E039 Hypothyroidism, unspecified: Secondary | ICD-10-CM | POA: Diagnosis not present

## 2022-03-17 DIAGNOSIS — Z0001 Encounter for general adult medical examination with abnormal findings: Secondary | ICD-10-CM | POA: Diagnosis not present

## 2022-03-17 DIAGNOSIS — E559 Vitamin D deficiency, unspecified: Secondary | ICD-10-CM | POA: Diagnosis not present

## 2022-03-17 DIAGNOSIS — E538 Deficiency of other specified B group vitamins: Secondary | ICD-10-CM | POA: Diagnosis not present

## 2022-03-17 LAB — LIPID PANEL
Cholesterol: 221 — AB (ref 0–200)
HDL: 42 (ref 35–70)
LDL Cholesterol: 132
Triglycerides: 327 — AB (ref 40–160)

## 2022-03-17 LAB — HEMOGLOBIN A1C: Hemoglobin A1C: 5.4

## 2022-03-27 ENCOUNTER — Telehealth: Payer: Self-pay

## 2022-03-27 DIAGNOSIS — E782 Mixed hyperlipidemia: Secondary | ICD-10-CM | POA: Diagnosis not present

## 2022-03-27 DIAGNOSIS — E039 Hypothyroidism, unspecified: Secondary | ICD-10-CM | POA: Diagnosis not present

## 2022-03-27 DIAGNOSIS — G629 Polyneuropathy, unspecified: Secondary | ICD-10-CM | POA: Diagnosis not present

## 2022-03-27 DIAGNOSIS — R7302 Impaired glucose tolerance (oral): Secondary | ICD-10-CM | POA: Diagnosis not present

## 2022-03-27 NOTE — Telephone Encounter (Signed)
Patient called wanting to update Dr Ernst Bowler regarding her finding a PCP. She is now seeing Dr. Rosita Fire and he is going to refer her to Dr Dorris Fetch for Glucose Intolerance.   Thanks

## 2022-03-27 NOTE — Telephone Encounter (Signed)
Thank you for the update.  Laquida Cotrell, MD [facility]  

## 2022-04-07 HISTORY — PX: TOOTH EXTRACTION: SUR596

## 2022-04-11 DIAGNOSIS — M546 Pain in thoracic spine: Secondary | ICD-10-CM | POA: Diagnosis not present

## 2022-04-11 DIAGNOSIS — M9902 Segmental and somatic dysfunction of thoracic region: Secondary | ICD-10-CM | POA: Diagnosis not present

## 2022-04-11 DIAGNOSIS — M9903 Segmental and somatic dysfunction of lumbar region: Secondary | ICD-10-CM | POA: Diagnosis not present

## 2022-04-11 DIAGNOSIS — M9901 Segmental and somatic dysfunction of cervical region: Secondary | ICD-10-CM | POA: Diagnosis not present

## 2022-04-11 DIAGNOSIS — M62830 Muscle spasm of back: Secondary | ICD-10-CM | POA: Diagnosis not present

## 2022-04-12 ENCOUNTER — Other Ambulatory Visit: Payer: Self-pay

## 2022-04-12 ENCOUNTER — Ambulatory Visit (INDEPENDENT_AMBULATORY_CARE_PROVIDER_SITE_OTHER): Payer: Medicare Other | Admitting: Allergy & Immunology

## 2022-04-12 ENCOUNTER — Encounter: Payer: Self-pay | Admitting: Allergy & Immunology

## 2022-04-12 VITALS — BP 132/72 | HR 79 | Resp 18 | Wt 280.0 lb

## 2022-04-12 DIAGNOSIS — T7840XD Allergy, unspecified, subsequent encounter: Secondary | ICD-10-CM | POA: Diagnosis not present

## 2022-04-12 DIAGNOSIS — J310 Chronic rhinitis: Secondary | ICD-10-CM

## 2022-04-12 DIAGNOSIS — K9049 Malabsorption due to intolerance, not elsewhere classified: Secondary | ICD-10-CM

## 2022-04-12 DIAGNOSIS — R768 Other specified abnormal immunological findings in serum: Secondary | ICD-10-CM

## 2022-04-12 DIAGNOSIS — J4520 Mild intermittent asthma, uncomplicated: Secondary | ICD-10-CM

## 2022-04-12 NOTE — Progress Notes (Signed)
FOLLOW UP  Date of Service/Encounter:  04/12/22   Assessment:   Intermittent asthma, uncomplicated   Chronic rhinitis - with negative environmental testing    Multiple reported food allergies versus food intolerances   Positive anti-dsDNA antibody   Past diagnosis of rheumatoid arthritis and osteoarthritis   On disability   PTSD   Hypercholesterolemia   Overall, Catherine Turner is doing quite well.  I think she was really reassured that a lot of her testing was negative.  She has been doing well with daily antihistamine.  She was concerned that this would affect the blood work performed by Dr. Dorris Fetch, but I assured her that the Claritin will not affect his blood work.  I also think that working with Jearld Fenton, the registered dietitian who works with Dr. Dorris Fetch, will help with her overall health.  She has been working on losing weight, which I think is always a good goal.  I did offer doing some intradermal testing which is more sensitive than the prick testing in the blood work and she wanted to do that for the next visit.  We have discussed adding on Xolair as an immunomodulatory agent, but she wants to hold off on this.  Plan/Recommendations:   1. Allergic reactions and concern for glucose intolerance - I would just avoid all of your triggering foods. - Continue with a low histamine diet. - Continue with your Claritin daily (this should not affect Dr. Liliane Channel blood work).  - Dr. Dorris Fetch has a Registered Dietician.  2. Concern for mast cell activation syndrome - All of your labs that we sent at the first visit were all negative. - This is all reassuring thankfully.  - Taking the Claritin daily should help prevent these.   3. Environmental allergies - We could do more sensitive intradermal testing in the future if we want to look into this more.  - We can do that at the next visit.   4. Return in about 4 weeks (around 05/10/2022) for intradermal testing (more sensitive than  prick testing).   Subjective:   Catherine Turner is a 66 y.o. female presenting today for follow up of  Chief Complaint  Patient presents with   Asthma   Allergic Rhinitis     Catherine Turner has a history of the following: Patient Active Problem List   Diagnosis Date Noted   Positive ANA nucleolar, positive dsDNA 04/15/2017   Tendinopathy of right shoulder 04/15/2017   Primary osteoarthritis of left hip 04/15/2017   Obesity, morbid ([place]) 12/06/2016   Asthma with bronchitis 09/21/2014   History of colonic polyps    Diverticulosis of colon without hemorrhage    Thoracic outlet syndrome 05/07/2014   Dyspnea 04/13/2014   Obstructive sleep apnea 07/30/2013   Seasonal and perennial allergic rhinitis 07/30/2013   PTSD (post-traumatic stress disorder) 01/13/2013   Anxiety state 01/13/2013   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  Catherine Turner is a 66 y.o. female presenting for a follow up visit.  She was last seen as a new patient in June 2023.  At that time, testing was negative to the entire environmental allergy panel and food panel.  We continued with her low histamine diet which she had already instituted.  For her concern of mast cell activation syndrome, we obtained lab work which was all completely normal.  This includes urine catecholamines as well as chromogranin, tryptase, metanephrines,  prostaglandin, C-kit mutation, alpha gal, CBC, inflammatory markers, metabolic panel, and ANA as well as extended mold panel was obtained.  We talked about starting an antihistamine as well as hydroxyzine at night.  We also gave her information on Xolair.  Since the last visit, she has done better. She has established care with a new PCP. She also tried to make an appointment with Dr. Dorris Fetch but she was denied initially because she was not referred by her PCP; she has an appointment now on September 29th. She has  adjusted her diet. She does not eat a lot of sugar, but it "hurts [her] joints".  In any case, she is now seen by Dr. Rosita Fire. She has a PCP f/u in November 2023.   She had one allergic reaction yesterday since we saw her. She had previously gone to see her chiropracter. By the time she got home, her nose was running. She is taking Claritin daily-ish.   She has been keeping off of her supplements. She is still itching but it is not as bad. She is doing 1200 calories per day, but sometimes up to 1800.  She is interested in seeing the registered dietitian with Dr. Dorris Fetch. She likes structure and will follow rules if they are provided.  She references lupus, but she had a negative ANA when I saw her in June. She saw a rheumatologist, but that doctor "did not answer the questions". She did have an elevated anti-dsDNA titer in 2019. She has another Rheumatologist that she has looked into seeing, but she never established care with this person.  She is not really interested in getting a referral to a new rheumatologist.  Otherwise, there have been no changes to her past medical history, surgical history, family history, or social history.    Review of Systems  Constitutional: Negative.  Negative for chills, fever, malaise/fatigue and weight loss.  HENT:  Positive for congestion. Negative for ear discharge, ear pain and sinus pain.   Eyes:  Negative for blurred vision, double vision, pain, discharge and redness.  Respiratory:  Negative for cough, sputum production, shortness of breath and wheezing.   Cardiovascular: Negative.  Negative for chest pain and palpitations.  Gastrointestinal:  Negative for abdominal pain, constipation, diarrhea, heartburn, nausea and vomiting.  Musculoskeletal:  Positive for back pain and joint pain.  Skin: Negative.  Negative for itching and rash.  Neurological:  Negative for dizziness and headaches.  Endo/Heme/Allergies:  Positive for environmental allergies. Does not  bruise/bleed easily.       Objective:   Blood pressure 132/72, pulse 79, resp. rate 18, weight 280 lb (127 kg), SpO2 92 %. Body mass index is 47.32 kg/m.    Physical Exam Vitals reviewed.  Constitutional:      Appearance: She is well-developed. She is obese.  HENT:     Head: Normocephalic and atraumatic.     Right Ear: Tympanic membrane, ear canal and external ear normal. No drainage, swelling or tenderness. Tympanic membrane is not injected, scarred, erythematous, retracted or bulging.     Left Ear: Tympanic membrane, ear canal and external ear normal. No drainage, swelling or tenderness. Tympanic membrane is not injected, scarred, erythematous, retracted or bulging.     Nose: No nasal deformity, septal deviation, mucosal edema or rhinorrhea.     Right Turbinates: Enlarged and swollen.     Left Turbinates: Enlarged and swollen.     Right Sinus: No maxillary sinus tenderness or frontal sinus tenderness.     Left  Sinus: No maxillary sinus tenderness or frontal sinus tenderness.     Comments: No nasal polyps.    Mouth/Throat:     Mouth: Mucous membranes are not pale and not dry.     Pharynx: Uvula midline.  Eyes:     General: Allergic shiner present.        Right eye: No discharge.        Left eye: No discharge.     Conjunctiva/sclera: Conjunctivae normal.     Right eye: Right conjunctiva is not injected. No chemosis.    Left eye: Left conjunctiva is not injected. No chemosis.    Pupils: Pupils are equal, round, and reactive to light.     Comments: No allergic shiners noted.  Cardiovascular:     Rate and Rhythm: Normal rate and regular rhythm.     Heart sounds: Normal heart sounds.  Pulmonary:     Effort: Pulmonary effort is normal. No tachypnea, accessory muscle usage or respiratory distress.     Breath sounds: Normal breath sounds. No wheezing, rhonchi or rales.     Comments: Difficult to hear at the bases likely due to body habitus. Chest:     Chest wall: No  tenderness.  Lymphadenopathy:     Head:     Right side of head: No submandibular, tonsillar or occipital adenopathy.     Left side of head: No submandibular, tonsillar or occipital adenopathy.     Cervical: No cervical adenopathy.  Skin:    General: Skin is warm.     Capillary Refill: Capillary refill takes less than 2 seconds.     Coloration: Skin is not pale.     Findings: No abrasion, erythema, petechiae or rash. Rash is not papular, urticarial or vesicular.     Comments: She does have a rather odd appearing lesion on her right arm.  Darier sign negative.  No urticaria appreciated.  Neurological:     Mental Status: She is alert.  Psychiatric:        Behavior: Behavior is cooperative.      Diagnostic studies: none      Salvatore Marvel, MD  [facility]

## 2022-04-12 NOTE — Patient Instructions (Addendum)
1. Allergic reactions and concern for glucose intolerance - I would just avoid all of your triggering foods. - Continue with a low histamine diet. - Continue with your Claritin daily (this should not affect Dr. Liliane Channel blood work).  - Dr. Dorris Fetch has a Registered Dietician.  2. Concern for mast cell activation syndrome - All of your labs that we sent at the first visit were all negative. - This is all reassuring thankfully.  - Taking the Claritin daily should help prevent these.   3. Environmental allergies - We could do more sensitive intradermal testing in the future if we want to look into this more.  - We can do that at the next visit.   4. Return in about 4 weeks (around 05/10/2022) for intradermal testing (more sensitive than prick testing).    Please inform us of any Emergency Department visits, hospitalizations, or changes in symptoms. Call us before going to the ED for breathing or allergy symptoms since we might be able to fit you in for a sick visit. Feel free to contact us anytime with any questions, problems, or concerns.  It was a pleasure to see you today!   Websites that have reliable patient information: 1. American Academy of Asthma, Allergy, and Immunology: www.aaaai.org 2. Food Allergy Research and Education (FARE): foodallergy.org 3. Mothers of Asthmatics: http://www.asthmacommunitynetwork.org 4. American College of Allergy, Asthma, and Immunology: www.acaai.org   COVID-19 Vaccine Information can be found at: [email or web address].[email or web address].uk For questions related to vaccine distribution or appointments, please email [email or web address] or call [phone].   We realize that you might be concerned about having an allergic reaction to the COVID19 vaccines. To help with that concern, WE ARE OFFERING THE COVID19 VACCINES IN OUR OFFICE! Ask the front desk for dates!     "Like" [place] on Facebook and Instagram for our latest  updates!      A healthy democracy works best when [employer] participate! Make sure you are registered to vote! If you have moved or changed any of your contact information, you will need to get this updated before voting!  In some cases, you MAY be able to register to vote online: [email or web address]

## 2022-04-14 ENCOUNTER — Ambulatory Visit: Payer: Medicare Other | Admitting: Allergy & Immunology

## 2022-04-25 DIAGNOSIS — H40013 Open angle with borderline findings, low risk, bilateral: Secondary | ICD-10-CM | POA: Diagnosis not present

## 2022-04-28 DIAGNOSIS — M3500 Sicca syndrome, unspecified: Secondary | ICD-10-CM | POA: Insufficient documentation

## 2022-05-05 ENCOUNTER — Ambulatory Visit: Payer: Medicare Other | Admitting: "Endocrinology

## 2022-05-10 ENCOUNTER — Ambulatory Visit: Payer: Medicare Other | Admitting: Family Medicine

## 2022-05-16 ENCOUNTER — Encounter: Payer: Self-pay | Admitting: Nutrition

## 2022-05-16 ENCOUNTER — Encounter: Payer: Medicare Other | Attending: Internal Medicine | Admitting: Nutrition

## 2022-05-16 DIAGNOSIS — Z91018 Allergy to other foods: Secondary | ICD-10-CM | POA: Insufficient documentation

## 2022-05-16 DIAGNOSIS — K9041 Non-celiac gluten sensitivity: Secondary | ICD-10-CM | POA: Insufficient documentation

## 2022-05-16 NOTE — Progress Notes (Signed)
Medical Nutrition Therapy  Appointment Start time:  [phone]  Appointment End time:  49  Primary concerns today: Food intolerance Referral diagnosis: K90.49 Preferred learning style: no preference  Learning readiness: ready   NUTRITION ASSESSMENT  66 y old wfemale here due to multiple food allergies and doesn't know what to eat to help her lose weight. She is also concerned she may have diabetes. She has been testing her blood sugars in am and they are running 112-130's.  She notes she use to see [facility] and recently switched to Dr. Legrand Rams.  She notes a lot of foods she can't tolerate and cause reactions in her mouth of swelling, blisters. Other foods she  can't tolerate due to GI issues like gluten, dairy. She typically only eats 2 meals per day- Lunch and dinner. She has been able to eat fruits, some low carb vegetables, sweet potato. She note she is allergic to nuts and  any pea pod foods. Says she doesn't have an appetite.   She notes her sleep is not good. She usually doesn't get up due to how she is feeling and eats her first meal usually around 11-12.  Use to weight 460 lbs when she had a miscarriage and lost her child. She notes she has PTSD from that and other life experiences. She notes she was 240 lbs at age 48.  Sees Dr. Ernst Bowler for her allergies.She was told to stop all her OTC supplements.  She has had a disordered eating pattern for quite some time according to her food recalls. She has been doing some kind of intermittent fasting.  She complains of why her legs and arms wont work at times and she has no energy.  Her current diet is insuffient  and inconsistent to meet her nutritional needs.  Anthropometrics  Wt Readings from Last 3 Encounters:  05/16/22 281 lb (127.5 kg)  04/12/22 280 lb (127 kg)  01/06/22 291 lb (132 kg)   Ht Readings from Last 3 Encounters:  05/16/22 '5\' 5"'$  (1.651 m)  01/06/22 5' 4.5" (1.638 m)  12/07/17 '5\' 5"'$  (1.651 m)   Body  mass index is 46.76 kg/m. '@BMIFA'$ @ Facility age limit for growth %iles is 20 years. Facility age limit for growth %iles is 20 years.     Latest Ref Rng & Units 01/12/2022    8:22 AM 10/15/2017   11:49 AM 12/23/2014    2:26 PM  CMP  Glucose 70 - 99 mg/dL 134  112  82   BUN 8 - 27 mg/dL '15  16  11   '$ Creatinine 0.57 - 1.00 mg/dL 0.92  1.05  0.92   Sodium 134 - 144 mmol/L 143  136  144   Potassium 3.5 - 5.2 mmol/L 4.1  4.0  4.0   Chloride 96 - 106 mmol/L 104  99  107   CO2 20 - 29 mmol/L '25  28  30   '$ Calcium 8.7 - 10.3 mg/dL 9.5  10.7  9.4   Total Protein 6.0 - 8.5 g/dL 7.1  7.8  6.8   Total Bilirubin 0.0 - 1.2 mg/dL 0.4  0.8  0.7   Alkaline Phos 44 - 121 IU/L 99   66   AST 0 - 40 IU/L '21  28  31   '$ ALT 0 - 32 IU/L '15  24  22      '$ Clinical Medical Hx: Sleep apnea, allergies to foods and medications, PTSD, depression/anxiety, Gluten sensitivity, Diverticulosis Medications: See list. Labs:  Notable  Signs/Symptoms:fatigue, GI issues, swelling in her mouth from antibiotics-swallowing issues temporarily,   Lifestyle & Dietary Hx Lives by herself.  Estimated daily fluid intake: 120 oz Supplements: none Sleep: 4-5 hrs Stress / self-care: none Current average weekly physical activity: ADL  24-Hr Dietary Recall First Meal: 12 noon; 32 oz of pineapple and cranberry juice Snack:  Second Meal: 03-0 pm 5 slices [place] bacon and hash browns Snack: 2 yogurts, 1 c applesauce Third Meal: 1 peach cup, 40 oz of water Snack:  Beverages: water  Estimated Energy Needs Calories: 1200 Carbohydrate: 135g Protein: 90g Fat: 33g   NUTRITION DIAGNOSIS  [place] 5-1.4 Altered GI function As related to food and medication allergies.  As evidenced by allergic reaction to nut, gluten, pea pods, dairy causig inflammation and multiple medications-see list...  NUTRITION INTERVENTION  Nutrition education (E-1) on the following topics:  Gluten free diet Lifestyle medicine with exclusion of food that cause  allergies.  - Whole Food, Plant Predominant Nutrition is highly recommended: Eat Plenty of vegetables, Mushrooms, fruits, Legumes. Avoid all  processed foods, snacks/pastries red meat, poultry, eggs.    -It is better to avoid simple carbohydrates including: Cakes, Sweet Desserts, Ice Cream, Soda (diet and regular), Sweet Tea, Candies, Chips, Cookies, Store Bought Juices, Alcohol in Excess of  1-2 drinks a day, Lemonade,  Artificial Sweeteners, Doughnuts, Coffee Creamers, "Sugar-free" Products, etc, etc.  This is not a complete list.....  Exercise: If you are able: 30 -60 minutes a day ,4 days a week, or 150 minutes a week.  The longer the better.  Combine stretch, strength, and aerobic activities.  If you were told in the past that you have high risk for cardiovascular diseases, you may seek evaluation by your heart doctor prior to initiating moderate to intense exercise programs.   Handouts Provided Include  Lifestyle Medicine  Learning Style & Readiness for Change Teaching method utilized: Visual & Auditory  Demonstrated degree of understanding via: Teach Back  Barriers to learning/adherence to lifestyle change: Disordered eating habits  Goals Established by Pt Goals  Will focus on eating 3 meals per day at times discussed. Focus on protein with each meals   MONITORING & EVALUATION Dietary intake, weekly physical activity, and weight in 1 month. Recommend to get her A1C checked with her new PCP. Next Steps  Patient is to keep a food journal..

## 2022-05-16 NOTE — Patient Instructions (Signed)
Goals  Will focus on eating 3 meals per day at times discussed. Focus on protein with each meals

## 2022-05-17 DIAGNOSIS — M9901 Segmental and somatic dysfunction of cervical region: Secondary | ICD-10-CM | POA: Diagnosis not present

## 2022-05-17 DIAGNOSIS — M9902 Segmental and somatic dysfunction of thoracic region: Secondary | ICD-10-CM | POA: Diagnosis not present

## 2022-05-17 DIAGNOSIS — M62830 Muscle spasm of back: Secondary | ICD-10-CM | POA: Diagnosis not present

## 2022-05-17 DIAGNOSIS — M9903 Segmental and somatic dysfunction of lumbar region: Secondary | ICD-10-CM | POA: Diagnosis not present

## 2022-05-17 DIAGNOSIS — M546 Pain in thoracic spine: Secondary | ICD-10-CM | POA: Diagnosis not present

## 2022-05-25 ENCOUNTER — Encounter: Payer: Self-pay | Admitting: Nutrition

## 2022-05-25 NOTE — Patient Instructions (Signed)
Chronic rhinitis Your skin testing was positive to indoor mold, outdoor mold, tree pollen, and dust mites. Allergen avoidance measures are listed below Continue Claritin 10 mg once  aday as needed for a runny nose or itch.  Continue Flonase 2 sprays in each nostril once a day as needed for a stuffy nose Consider saline nasal rinses as needed for nasal symptoms. Use this before any medicated nasal sprays for best result Consider allergen immunotherapy if your symptoms are not well controlled with the treatment plan as listed above  Call the clinic if this treatment plan is not working well for you.  Follow up in 3 months or sooner if needed.  Reducing Pollen Exposure The American Academy of Allergy, Asthma and Immunology suggests the following steps to reduce your exposure to pollen during allergy seasons. Do not hang sheets or clothing out to dry; pollen may collect on these items. Do not mow lawns or spend time around freshly cut grass; mowing stirs up pollen. Keep windows closed at night.  Keep car windows closed while driving. Minimize morning activities outdoors, a time when pollen counts are usually at their highest. Stay indoors as much as possible when pollen counts or humidity is high and on windy days when pollen tends to remain in the air longer. Use air conditioning when possible.  Many air conditioners have filters that trap the pollen spores. Use a HEPA room air filter to remove pollen form the indoor air you breathe.   Control of Dust Mite Allergen Dust mites play a major role in allergic asthma and rhinitis. They occur in environments with high humidity wherever human skin is found. Dust mites absorb humidity from the atmosphere (ie, they do not drink) and feed on organic matter (including shed human and animal skin). Dust mites are a microscopic type of insect that you cannot see with the naked eye. High levels of dust mites have been detected from mattresses, pillows, carpets,  upholstered furniture, bed covers, clothes, soft toys and any woven material. The principal allergen of the dust mite is found in its feces. A gram of dust may contain 1,000 mites and 250,000 fecal particles. Mite antigen is easily measured in the air during house cleaning activities. Dust mites do not bite and do not cause harm to humans, other than by triggering allergies/asthma.  Ways to decrease your exposure to dust mites in your home:  1. Encase mattresses, box springs and pillows with a mite-impermeable barrier or cover  2. Wash sheets, blankets and drapes weekly in hot water (130 F) with detergent and dry them in a dryer on the hot setting.  3. Have the room cleaned frequently with a vacuum cleaner and a damp dust-mop. For carpeting or rugs, vacuuming with a vacuum cleaner equipped with a high-efficiency particulate air (HEPA) filter. The dust mite allergic individual should not be in a room which is being cleaned and should wait 1 hour after cleaning before going into the room.  4. Do not sleep on upholstered furniture (eg, couches).  5. If possible removing carpeting, upholstered furniture and drapery from the home is ideal. Horizontal blinds should be eliminated in the rooms where the person spends the most time (bedroom, study, television room). Washable vinyl, roller-type shades are optimal.  6. Remove all non-washable stuffed toys from the bedroom. Wash stuffed toys weekly like sheets and blankets above.  7. Reduce indoor humidity to less than 50%. Inexpensive humidity monitors can be purchased at most hardware stores. Do not use  a humidifier as can make the problem worse and are not recommended.  Control of Mold Allergen Mold and fungi can grow on a variety of surfaces provided certain temperature and moisture conditions exist.  Outdoor molds grow on plants, decaying vegetation and soil.  The major outdoor mold, Alternaria and Cladosporium, are found in very high numbers during hot  and dry conditions.  Generally, a late Summer - Fall peak is seen for common outdoor fungal spores.  Rain will temporarily lower outdoor mold spore count, but counts rise rapidly when the rainy period ends.  The most important indoor molds are Aspergillus and Penicillium.  Dark, humid and poorly ventilated basements are ideal sites for mold growth.  The next most common sites of mold growth are the bathroom and the kitchen.  Outdoor [employer] Use air conditioning and keep windows closed Avoid exposure to decaying vegetation. Avoid leaf raking. Avoid grain handling. Consider wearing a face mask if working in moldy areas.  Indoor Mold Control Maintain humidity below 50%. Clean washable surfaces with 5% bleach solution. Remove sources e.g. Contaminated carpets.

## 2022-05-26 ENCOUNTER — Ambulatory Visit (INDEPENDENT_AMBULATORY_CARE_PROVIDER_SITE_OTHER): Payer: Medicare Other | Admitting: Family Medicine

## 2022-05-26 ENCOUNTER — Encounter: Payer: Self-pay | Admitting: Family Medicine

## 2022-05-26 VITALS — BP 122/72 | HR 97 | Temp 97.6°F | Resp 16

## 2022-05-26 DIAGNOSIS — Z8709 Personal history of other diseases of the respiratory system: Secondary | ICD-10-CM

## 2022-05-26 DIAGNOSIS — J302 Other seasonal allergic rhinitis: Secondary | ICD-10-CM

## 2022-05-26 DIAGNOSIS — J3089 Other allergic rhinitis: Secondary | ICD-10-CM

## 2022-05-26 DIAGNOSIS — R21 Rash and other nonspecific skin eruption: Secondary | ICD-10-CM | POA: Diagnosis not present

## 2022-05-26 NOTE — Progress Notes (Signed)
[address], [address] [place] [address] Dept: [phone]  FOLLOW UP NOTE  Patient ID: Catherine Turner, female    DOB: 06/30/1956  Age: 66 y.o. MRN: [id number] Date of Office Visit: 05/26/2022  Assessment  Chief Complaint: Allergy Testing and Other (Had her tooth pulled last Thursday. Has lost appetite for about a month or 2. )  HPI [name] is a 66 year old female who presents to the clinic for follow-up visit.  She was last seen in this clinic on 04/12/2022 by Dr. Ernst Bowler for evaluation of asthma, chronic rhinitis, food intolerance, and possible MCAS.  Her last environmental allergy skin prick testing was on 01/06/2022 and was negative to the environmental panel.  She presents to the clinic today for intradermal environmental allergy testing.  At today's visit, she reports her   Drug Allergies:  Allergies  Allergen Reactions  . Advair Diskus [Fluticasone-Salmeterol] Other (See Comments)    Blood in urine  . Ciprofloxacin Anaphylaxis  . Escitalopram Anaphylaxis  . Jasmine Fragrance Anaphylaxis  . Breo Ellipta [Fluticasone Furoate-Vilanterol] Other (See Comments)    Swelling of the abdomin.   . Diclofenac Other (See Comments)    unknown  . Furosemide Swelling  . Molds & Smuts Other (See Comments)    unknown  . Peanuts [Peanut Oil]   . Potassium-Containing Compounds Swelling  . Pravastatin Other (See Comments)    unknown  . Tilactase Other (See Comments)    unknown  . Latex Rash  . Pork-Derived Products Rash  . Sulfa Antibiotics Rash    Physical Exam: BP 122/72   Pulse 97   Temp 97.6 F (36.4 C)   Resp 16   SpO2 98%    Physical Exam  Diagnostics:    Assessment and Plan: No diagnosis found.  No orders of the defined types were placed in this encounter.   Patient Instructions  Chronic rhinitis Your skin testing was positive to indoor mold, outdoor mold, tree pollen, and dust mites. Allergen avoidance measures are listed below Continue  Claritin 10 mg once  aday as needed for a runny nose or itch.  Continue Flonase 2 sprays in each nostril once a day as needed for a stuffy nose Consider saline nasal rinses as needed for nasal symptoms. Use this before any medicated nasal sprays for best result Consider allergen immunotherapy if your symptoms are not well controlled with the treatment plan as listed above  Call the clinic if this treatment plan is not working well for you.  Follow up in 3 months or sooner if needed.  Reducing Pollen Exposure The American Academy of Allergy, Asthma and Immunology suggests the following steps to reduce your exposure to pollen during allergy seasons. Do not hang sheets or clothing out to dry; pollen may collect on these items. Do not mow lawns or spend time around freshly cut grass; mowing stirs up pollen. Keep windows closed at night.  Keep car windows closed while driving. Minimize morning activities outdoors, a time when pollen counts are usually at their highest. Stay indoors as much as possible when pollen counts or humidity is high and on windy days when pollen tends to remain in the air longer. Use air conditioning when possible.  Many air conditioners have filters that trap the pollen spores. Use a HEPA room air filter to remove pollen form the indoor air you breathe.   Control of Dust Mite Allergen Dust mites play a major role in allergic asthma and rhinitis. They occur in environments  with high humidity wherever human skin is found. Dust mites absorb humidity from the atmosphere (ie, they do not drink) and feed on organic matter (including shed human and animal skin). Dust mites are a microscopic type of insect that you cannot see with the naked eye. High levels of dust mites have been detected from mattresses, pillows, carpets, upholstered furniture, bed covers, clothes, soft toys and any woven material. The principal allergen of the dust mite is found in its feces. A gram of dust may  contain 1,000 mites and 250,000 fecal particles. Mite antigen is easily measured in the air during house cleaning activities. Dust mites do not bite and do not cause harm to humans, other than by triggering allergies/asthma.  Ways to decrease your exposure to dust mites in your home:  1. Encase mattresses, box springs and pillows with a mite-impermeable barrier or cover  2. Wash sheets, blankets and drapes weekly in hot water (130 F) with detergent and dry them in a dryer on the hot setting.  3. Have the room cleaned frequently with a vacuum cleaner and a damp dust-mop. For carpeting or rugs, vacuuming with a vacuum cleaner equipped with a high-efficiency particulate air (HEPA) filter. The dust mite allergic individual should not be in a room which is being cleaned and should wait 1 hour after cleaning before going into the room.  4. Do not sleep on upholstered furniture (eg, couches).  5. If possible removing carpeting, upholstered furniture and drapery from the home is ideal. Horizontal blinds should be eliminated in the rooms where the person spends the most time (bedroom, study, television room). Washable vinyl, roller-type shades are optimal.  6. Remove all non-washable stuffed toys from the bedroom. Wash stuffed toys weekly like sheets and blankets above.  7. Reduce indoor humidity to less than 50%. Inexpensive humidity monitors can be purchased at most hardware stores. Do not use a humidifier as can make the problem worse and are not recommended.  Control of Mold Allergen Mold and fungi can grow on a variety of surfaces provided certain temperature and moisture conditions exist.  Outdoor molds grow on plants, decaying vegetation and soil.  The major outdoor mold, Alternaria and Cladosporium, are found in very high numbers during hot and dry conditions.  Generally, a late Summer - Fall peak is seen for common outdoor fungal spores.  Rain will temporarily lower outdoor mold spore count, but  counts rise rapidly when the rainy period ends.  The most important indoor molds are Aspergillus and Penicillium.  Dark, humid and poorly ventilated basements are ideal sites for mold growth.  The next most common sites of mold growth are the bathroom and the kitchen.  Outdoor [employer] Use air conditioning and keep windows closed Avoid exposure to decaying vegetation. Avoid leaf raking. Avoid grain handling. Consider wearing a face mask if working in moldy areas.  Indoor Mold Control Maintain humidity below 50%. Clean washable surfaces with 5% bleach solution. Remove sources e.g. Contaminated carpets.   No follow-ups on file.    Thank you for the opportunity to care for this patient.  Please do not hesitate to contact me with questions.  Gareth Morgan, FNP [facility]

## 2022-05-28 ENCOUNTER — Encounter: Payer: Self-pay | Admitting: Family Medicine

## 2022-05-28 DIAGNOSIS — R21 Rash and other nonspecific skin eruption: Secondary | ICD-10-CM | POA: Insufficient documentation

## 2022-05-28 DIAGNOSIS — Z8709 Personal history of other diseases of the respiratory system: Secondary | ICD-10-CM | POA: Insufficient documentation

## 2022-06-01 ENCOUNTER — Encounter: Payer: Self-pay | Admitting: "Endocrinology

## 2022-06-01 ENCOUNTER — Ambulatory Visit (INDEPENDENT_AMBULATORY_CARE_PROVIDER_SITE_OTHER): Payer: Medicare Other | Admitting: "Endocrinology

## 2022-06-01 VITALS — BP 122/78 | HR 76 | Ht 64.5 in | Wt 272.4 lb

## 2022-06-01 DIAGNOSIS — K760 Fatty (change of) liver, not elsewhere classified: Secondary | ICD-10-CM | POA: Diagnosis not present

## 2022-06-01 DIAGNOSIS — E782 Mixed hyperlipidemia: Secondary | ICD-10-CM | POA: Diagnosis not present

## 2022-06-01 DIAGNOSIS — E039 Hypothyroidism, unspecified: Secondary | ICD-10-CM

## 2022-06-01 DIAGNOSIS — Z6841 Body Mass Index (BMI) 40.0 and over, adult: Secondary | ICD-10-CM

## 2022-06-01 NOTE — Patient Instructions (Signed)
                                     Advice for Weight Management  -For most of us the best way to lose weight is by diet management. Generally speaking, diet management means consuming less calories intentionally which over time brings about progressive weight loss.  This can be achieved more effectively by avoiding ultra processed carbohydrates, processed meats, unhealthy fats.    It is critically important to know your numbers: how much calorie you are consuming and how much calorie you need. More importantly, our carbohydrates sources should be unprocessed naturally occurring  complex starch food items.  It is always important to balance nutrition also by  appropriate intake of proteins (mainly plant-based), healthy fats/oils, plenty of fruits and vegetables.   -The American College of Lifestyle Medicine (ACL M) recommends nutrition derived mostly from Whole Food, Plant Predominant Sources example an apple instead of applesauce or apple pie. Eat Plenty of vegetables, Mushrooms, fruits, Legumes, Whole Grains, Nuts, seeds in lieu of processed meats, processed snacks/pastries red meat, poultry, eggs.  Use only water or unsweetened tea for hydration.  The College also recommends the need to stay away from risky substances including alcohol, smoking; obtaining 7-9 hours of restorative sleep, at least 150 minutes of moderate intensity exercise weekly, importance of healthy social connections, and being mindful of stress and seek help when it is overwhelming.    -Sticking to a routine mealtime to eat 3 meals a day and avoiding unnecessary snacks is shown to have a big role in weight control. Under normal circumstances, the only time we burn stored energy is when we are hungry, so allow  some hunger to take place- hunger means no food between appropriate meal times, only water.  It is not advisable to starve.   -It is better to avoid simple carbohydrates including:  Cakes, Sweet Desserts, Ice Cream, Soda (diet and regular), Sweet Tea, Candies, Chips, Cookies, Store Bought Juices, Alcohol in Excess of  1-2 drinks a day, Lemonade,  Artificial Sweeteners, Doughnuts, Coffee Creamers, "Sugar-free" Products, etc, etc.  This is not a complete list.....    -Consulting with certified diabetes educators is proven to provide you with the most accurate and current information on diet.  Also, you may be  interested in discussing diet options/exchanges , we can schedule a visit with Catherine Turner, RDN, CDE for individualized nutrition education.  -Exercise: If you are able: 30 -60 minutes a day ,4 days a week, or 150 minutes of moderate intensity exercise weekly.    The longer the better if tolerated.  Combine stretch, strength, and aerobic activities.  If you were told in the past that you have high risk for cardiovascular diseases, or if you are currently symptomatic, you may seek evaluation by your heart doctor prior to initiating moderate to intense exercise programs.                                  Additional Care Considerations for Diabetes/Prediabetes   -Diabetes  is a chronic disease.  The most important care consideration is regular follow-up with your diabetes care provider with the goal being avoiding or delaying its complications and to take advantage of advances in medications and technology.  If appropriate actions are taken early enough, type 2 diabetes can even be   reversed.  Seek information from the right source.  - Whole Food, Plant Predominant Nutrition is highly recommended: Eat Plenty of vegetables, Mushrooms, fruits, Legumes, Whole Grains, Nuts, seeds in lieu of processed meats, processed snacks/pastries red meat, poultry, eggs as recommended by American College of  Lifestyle Medicine (ACLM).  -Type 2 diabetes is known to coexist with other important comorbidities such as high blood pressure and high cholesterol.  It is critical to control not only the  diabetes but also the high blood pressure and high cholesterol to minimize and delay the risk of complications including coronary artery disease, stroke, amputations, blindness, etc.  The good news is that this diet recommendation for type 2 diabetes is also very helpful for managing high cholesterol and high blood blood pressure.  - Studies showed that people with diabetes will benefit from a class of medications known as ACE inhibitors and statins.  Unless there are specific reasons not to be on these medications, the standard of care is to consider getting one from these groups of medications at an optimal doses.  These medications are generally considered safe and proven to help protect the heart and the kidneys.    - People with diabetes are encouraged to initiate and maintain regular follow-up with eye doctors, foot doctors, dentists , and if necessary heart and kidney doctors.     - It is highly recommended that people with diabetes quit smoking or stay away from smoking, and get yearly  flu vaccine and pneumonia vaccine at least every 5 years.  See above for additional recommendations on exercise, sleep, stress management , and healthy social connections.      

## 2022-06-01 NOTE — Progress Notes (Signed)
Endocrinology Consult Note                                            06/01/2022, 7:05 PM   Subjective:    Patient ID: Catherine Turner, female    DOB: 1955/10/13, PCP Carrolyn Meiers, MD   Past Medical History:  Diagnosis Date   Angio-edema    Arm fracture    right arm    Asthma    Bulging lumbar disc    Complication of anesthesia    pt sts, "I am a very shallow breather and they have to do something special for me"/   COPD (chronic obstructive pulmonary disease) (HCC)    Degenerative joint disease (DJD) of hip    DJD (degenerative joint disease) of hip    Eczema    Elevated LFTs    Hip pain    Hypothyroidism    Insomnia    Nerve damage    right arm.   PTSD (post-traumatic stress disorder)    Sleep apnea    has CPAP   Thyroid disease    Urticaria    Past Surgical History:  Procedure Laterality Date   COLONOSCOPY N/A 08/12/2014   Procedure: COLONOSCOPY;  Surgeon: Daneil Dolin, MD;  Location: [facility];  Service: Endoscopy;  Laterality: N/A;  115   thumb surgery     TOOTH EXTRACTION  08/270   UMBILICAL HERNIA REPAIR N/A 12/30/2014   Procedure: HERNIA REPAIR UMBILICAL ADULT WITH MESH;  Surgeon: Aviva Signs Md, MD;  Location: [facility];  Service: General;  Laterality: N/A;   Social History   Socioeconomic History   Marital status: Single    Spouse name: Not on file   Number of children: Not on file   Years of education: Not on file   Highest education level: Not on file  Occupational History   Not on file  Tobacco Use   Smoking status: Never    Passive exposure: Yes   Smokeless tobacco: Never  Vaping Use   Vaping Use: Never used  Substance and Sexual Activity   Alcohol use: Yes    Comment: 4 oz wine    Drug use: No   Sexual activity: Never    Birth control/protection: None  Other Topics Concern   Not on file  Social History Narrative   Not on file   Social Determinants of Health   Financial Resource Strain: Not on file  Food  Insecurity: Not on file  Transportation Needs: Not on file  Physical Activity: Not on file  Stress: Not on file  Social Connections: Not on file   Family History  Problem Relation Age of Onset   COPD Mother    Cancer - Lung Mother    Cancer Father    Hypertension Father    Drug abuse Sister    Dementia Maternal Aunt    Alcohol abuse Paternal Aunt    Alcohol abuse Paternal Uncle    Heart disease Maternal Grandmother    Heart disease Maternal Grandfather    Heart disease Paternal Grandmother    Heart disease Paternal Grandfather    Heart attack Paternal Grandfather    Physical abuse Other    Anxiety disorder Other    Birth defects Other        [name]   ADD / ADHD Neg Hx  Bipolar disorder Neg Hx    OCD Neg Hx    Paranoid behavior Neg Hx    Schizophrenia Neg Hx    Seizures Neg Hx    Sexual abuse Neg Hx    Allergic rhinitis Neg Hx    Asthma Neg Hx    Eczema Neg Hx    Urticaria Neg Hx    Outpatient Encounter Medications as of 06/01/2022  Medication Sig   albuterol (PROVENTIL HFA;VENTOLIN HFA) 108 (90 Base) MCG/ACT inhaler Inhale 2 puffs into the lungs every 4 (four) hours as needed for wheezing.   albuterol (PROVENTIL) (2.5 MG/3ML) 0.083% nebulizer solution Take 3 mLs (2.5 mg total) by nebulization every 6 (six) hours as needed for wheezing.   dextromethorphan-guaiFENesin (MUCINEX DM) 30-600 MG 12hr tablet Take by mouth.   EPINEPHrine 0.3 mg/0.3 mL IJ SOAJ injection Inject into the muscle as needed.   HYDROcodone-acetaminophen (NORCO/VICODIN) 5-325 MG tablet Take by mouth as needed.    LUMIGAN 0.01 % SOLN SMARTSIG:In Eye(s)   naproxen sodium (ALEVE) 220 MG tablet Take 220 mg by mouth as needed.   RESTASIS 0.05 % ophthalmic emulsion 1 drop 2 (two) times daily.   TIROSINT-SOL 100 MCG/ML SOLN Take by mouth.   No facility-administered encounter medications on file as of 06/01/2022.   ALLERGIES: Allergies  Allergen Reactions   Advair Diskus  [Fluticasone-Salmeterol] Other (See Comments)    Blood in urine   Ciprofloxacin Anaphylaxis   Escitalopram Anaphylaxis   Jasmine Fragrance Anaphylaxis   Breo Ellipta [Fluticasone Furoate-Vilanterol] Other (See Comments)    Swelling of the abdomin.    Diclofenac Other (See Comments)    unknown   Furosemide Swelling   Molds & Smuts Other (See Comments)    unknown   Peanuts [Peanut Oil]    Potassium-Containing Compounds Swelling   Pravastatin Other (See Comments)    unknown   Tilactase Other (See Comments)    unknown   Latex Rash   Pork-Derived Products Rash   Sulfa Antibiotics Rash    VACCINATION STATUS: Immunization History  Administered Date(s) Administered   Pneumococcal Polysaccharide-23 06/09/2015    HPI Catherine Turner is 66 y.o. female who presents today with a medical history as above. she is being seen in consultation for weight management, hypothyroidism requested by Carrolyn Meiers, MD. History is obtained from the patient as well as chart review.  Patient is poor historian, difficult to pinpoint. She reports that she did have a overweight/obesity for most of her adult life. She also reports allergy to some rare food items/groups. She has seen Jearld Fenton, RDN recently. She is attempting to engage.  She is not on medications to manage weight at this point.  She also has  hyperlipidemia not on antilipid treatment. She is on Tirosint solution 100 mg 1 times daily.  She denies any history of thyroidectomy nor thyroid ablation.  She does not have a diagnosis of hypothyroidism for more than 20 years.  She is on hydrocodone/acetaminophen for pain control. She measures her weight at least twice a week more or less staying between 272 and 290 pounds, recently losing. She is not a smoker, uses alcohol occasionally-claims to be using alcohol for muscular relaxation and rheumatoid arthritis flareups.   She denies family history of diabetes, however her father has  hypertension.  Review of Systems  Constitutional: + Minimally fluctuating body weight , recently lost 10 pounds since her consult with nutritionist.   + fatigue, no subjective hyperthermia, no subjective hypothermia Eyes: no blurry vision,  no xerophthalmia ENT: no sore throat, no nodules palpated in throat, no dysphagia/odynophagia, no hoarseness Cardiovascular: no Chest Pain, no Shortness of Breath, no palpitations, no leg swelling Respiratory: no cough, no shortness of breath Gastrointestinal: no Nausea/Vomiting/Diarhhea Musculoskeletal: + muscle/joint aches Skin: no rashes Neurological: no tremors, no numbness, no tingling, no dizziness Psychiatric: no depression, no anxiety  Objective:       06/01/2022    1:40 PM 05/26/2022    9:19 AM 05/16/2022    9:42 AM  Vitals with BMI  Height 5' 4.5"  '5\' 5"'$   Weight 272 lbs 6 oz  281 lbs  BMI 41.32  44.01  Systolic 027 253   Diastolic 78 72   Pulse 76 97     BP 122/78   Pulse 76   Ht 5' 4.5" (1.638 m)   Wt 272 lb 6.4 oz (123.6 kg)   BMI 46.04 kg/m   Wt Readings from Last 3 Encounters:  06/01/22 272 lb 6.4 oz (123.6 kg)  05/16/22 281 lb (127.5 kg)  04/12/22 280 lb (127 kg)    Physical Exam  Constitutional:  Body mass index is 46.04 kg/m.,  not in acute distress, normal state of mind Eyes: PERRLA, EOMI, no exophthalmos ENT: moist mucous membranes, no gross thyromegaly, no gross cervical lymphadenopathy Cardiovascular: normal precordial activity, Regular Rate and Rhythm, no Murmur/Rubs/Gallops Respiratory:  adequate breathing efforts, no gross chest deformity, Clear to auscultation bilaterally Gastrointestinal: abdomen soft, Non -tender, No distension, Bowel Sounds present, no gross organomegaly Musculoskeletal: no gross deformities, strength intact in all four extremities Skin: moist, warm, no rashes Neurological: no tremor with outstretched hands, Deep tendon reflexes normal in bilateral lower extremities.  CMP ( most  recent) CMP     Component Value Date/Time   NA 143 01/12/2022 0822   K 4.1 01/12/2022 0822   CL 104 01/12/2022 0822   CO2 25 01/12/2022 0822   GLUCOSE 134 (H) 01/12/2022 0822   GLUCOSE 112 (H) 10/15/2017 1149   BUN 15 01/12/2022 0822   CREATININE 0.92 01/12/2022 0822   CREATININE 1.05 10/15/2017 1149   CALCIUM 9.5 01/12/2022 0822   PROT 7.1 01/12/2022 0822   ALBUMIN 4.4 01/12/2022 0822   AST 21 01/12/2022 0822   ALT 15 01/12/2022 0822   ALKPHOS 99 01/12/2022 0822   BILITOT 0.4 01/12/2022 0822   GFRNONAA 58 (L) 10/15/2017 1149   GFRAA 67 10/15/2017 1149    Recent Results (from the past 2160 hour(s))  Lipid panel     Status: Abnormal   Collection Time: 03/17/22 12:00 AM  Result Value Ref Range   Triglycerides 327 (A) 40 - 160   Cholesterol 221 (A) 0 - 200   HDL 42 35 - 70   LDL Cholesterol 132   Hemoglobin A1c     Status: None   Collection Time: 03/17/22 12:00 AM  Result Value Ref Range   Hemoglobin A1C 5.4            Assessment & Plan:   1. Class 3 severe obesity due to excess calories with serious comorbidity and body mass index (BMI) of 45.0 to 49.9 in adult [facility]) 2.  Hypothyroidism 3.  Dyslipidemia  - Catherine Turner  is being seen at a kind request of Fanta, Normajean Baxter, MD. - I have reviewed her available  records and clinically evaluated the patient. - Based on these reviews, she has morbid obesity, hypothyroidism, hyperlipidemia,. She also has several allergies to food groups and items working with allergy specialist.  She already has consulted with RDN -trying to modify her diet. In light of her comorbid dyslipidemia and propensity for several medication diabetes, nonalcoholic liver disease, she will continue to benefit from lifestyle medicine before considering statins or Repatha.    - she acknowledges that there is a room for improvement in her food and drink choices. - Suggestion is made for her to avoid simple carbohydrates  from her diet  including Cakes, Sweet Desserts, Ice Cream, Soda (diet and regular), Sweet Tea, Candies, Chips, Cookies, Store Bought Juices, Alcohol , Artificial Sweeteners,  Coffee Creamer, and "Sugar-free" Products, Lemonade. This will help patient to have more stable blood glucose profile and potentially avoid unintended weight gain.  The following Lifestyle Medicine recommendations according to [facility]  [facility]) were discussed and and offered to patient and she  agrees to start the journey:  A. Whole Foods, Plant-Based Nutrition comprising of fruits and vegetables, plant-based proteins, whole-grain carbohydrates was discussed in detail with the patient.   A list for source of those nutrients were also provided to the patient.  Patient will use only water or unsweetened tea for hydration. B.  The need to stay away from risky substances including alcohol, smoking; obtaining 7 to 9 hours of restorative sleep, at least 150 minutes of moderate intensity exercise weekly, the importance of healthy social connections,  and stress management techniques were discussed. C.  A full color page of  Calorie density of various food groups per pound showing examples of each food groups was provided to the patient.   -She does not have diabetes.  She does not have insurance coverage for [place] nor [id number]. In light of her very many unexplained allergies, medication for weight management would be considered with caution.  For her hypothyroidism, she is advised to continue Tirosint 100 mcg solution p.o. daily before breakfast.    Her most recent thyroid functions were consistent with appropriate replacement.  - We discussed about the correct intake of her thyroid hormone, on empty stomach at fasting, with water, separated by at least 30 minutes from breakfast and other medications,  and separated by more than 4 hours from calcium, iron, multivitamins, acid reflux medications (PPIs). -Patient is made  aware of the fact that thyroid hormone replacement is needed for life, dose to be adjusted by periodic monitoring of thyroid function tests.   - she is advised to maintain close follow up with Carrolyn Meiers, MD for primary care needs.   - Time spent with the patient: 62 minutes, of which >50% was spent in  counseling her about her obesity, hypothyroidism, hyperlipidemia and the rest in obtaining information about her symptoms, reviewing her previous labs/studies ( including abstractions from other facilities),  evaluations, and treatments,  and developing a plan to confirm diagnosis and long term treatment based on the latest standards of care/guidelines; and documenting her care.  Catherine Turner participated in the discussions, expressed understanding, and voiced agreement with the above plans.  All questions were answered to her satisfaction. she is encouraged to contact clinic should she have any questions or concerns prior to her return visit.  Follow up plan: Return in about 3 months (around 09/01/2022) for Fasting Labs  in AM B4 8.   Glade Lloyd, MD [facility] [address] [place], [place] [address] Phone: [phone]  Fax: [phone]     06/01/2022, 7:05 PM  This note was partially dictated with voice recognition software. Similar  sounding words can be transcribed inadequately or may not  be corrected upon review.

## 2022-06-07 DIAGNOSIS — M791 Myalgia, unspecified site: Secondary | ICD-10-CM | POA: Diagnosis not present

## 2022-06-07 DIAGNOSIS — G629 Polyneuropathy, unspecified: Secondary | ICD-10-CM | POA: Diagnosis not present

## 2022-06-07 DIAGNOSIS — M169 Osteoarthritis of hip, unspecified: Secondary | ICD-10-CM | POA: Diagnosis not present

## 2022-06-08 DIAGNOSIS — H04123 Dry eye syndrome of bilateral lacrimal glands: Secondary | ICD-10-CM | POA: Diagnosis not present

## 2022-07-04 DIAGNOSIS — M797 Fibromyalgia: Secondary | ICD-10-CM | POA: Diagnosis not present

## 2022-07-04 DIAGNOSIS — J449 Chronic obstructive pulmonary disease, unspecified: Secondary | ICD-10-CM | POA: Diagnosis not present

## 2022-07-04 DIAGNOSIS — R7302 Impaired glucose tolerance (oral): Secondary | ICD-10-CM | POA: Diagnosis not present

## 2022-07-04 DIAGNOSIS — G629 Polyneuropathy, unspecified: Secondary | ICD-10-CM | POA: Diagnosis not present

## 2022-08-23 ENCOUNTER — Ambulatory Visit: Payer: Medicare Other | Admitting: Allergy & Immunology

## 2022-09-04 ENCOUNTER — Ambulatory Visit: Payer: Medicare Other | Admitting: "Endocrinology

## 2022-10-06 DIAGNOSIS — Z299 Encounter for prophylactic measures, unspecified: Secondary | ICD-10-CM | POA: Diagnosis not present

## 2022-10-06 DIAGNOSIS — M353 Polymyalgia rheumatica: Secondary | ICD-10-CM | POA: Diagnosis not present

## 2022-10-06 DIAGNOSIS — G629 Polyneuropathy, unspecified: Secondary | ICD-10-CM | POA: Diagnosis not present

## 2022-10-06 DIAGNOSIS — J449 Chronic obstructive pulmonary disease, unspecified: Secondary | ICD-10-CM | POA: Diagnosis not present

## 2022-10-06 DIAGNOSIS — R0989 Other specified symptoms and signs involving the circulatory and respiratory systems: Secondary | ICD-10-CM | POA: Diagnosis not present

## 2022-10-06 DIAGNOSIS — E039 Hypothyroidism, unspecified: Secondary | ICD-10-CM | POA: Diagnosis not present

## 2022-10-26 NOTE — Patient Instructions (Addendum)
Allergic rhinitis/conjunctivitis Continue allergen avoidance measures directed toward indoor mold, outdoor mold, tree pollen, and dust mites are listed below Continue Claritin 10 mg once  aday as needed for a runny nose or itch.  Continue Flonase 2 sprays in each nostril once a day as needed for a stuffy nose Consider saline nasal rinses as needed for nasal symptoms. Use this before any medicated nasal sprays for best result Consider saline nasal gel for dry nostrils Consider allergen immunotherapy if your symptoms are not well controlled with the treatment plan as listed above  History of asthma Continue albuterol 2 puffs once every 4 hours as needed for cough or wheeze  Rash If your symptoms re-occur, begin a journal of events that occurred for up to 6 hours before your symptoms began including foods and beverages consumed, soaps or perfumes you had contact with, and medications.  A referral to dermatology may be needed for further evaluation if not resolving  Call the clinic if this treatment plan is not working well for you.  Follow up in 1 year or sooner if needed.  Reducing Pollen Exposure The American Academy of Allergy, Asthma and Immunology suggests the following steps to reduce your exposure to pollen during allergy seasons. Do not hang sheets or clothing out to dry; pollen may collect on these items. Do not mow lawns or spend time around freshly cut grass; mowing stirs up pollen. Keep windows closed at night.  Keep car windows closed while driving. Minimize morning activities outdoors, a time when pollen counts are usually at their highest. Stay indoors as much as possible when pollen counts or humidity is high and on windy days when pollen tends to remain in the air longer. Use air conditioning when possible.  Many air conditioners have filters that trap the pollen spores. Use a HEPA room air filter to remove pollen form the indoor air you breathe.   Control of Dust Mite  Allergen Dust mites play a major role in allergic asthma and rhinitis. They occur in environments with high humidity wherever human skin is found. Dust mites absorb humidity from the atmosphere (ie, they do not drink) and feed on organic matter (including shed human and animal skin). Dust mites are a microscopic type of insect that you cannot see with the naked eye. High levels of dust mites have been detected from mattresses, pillows, carpets, upholstered furniture, bed covers, clothes, soft toys and any woven material. The principal allergen of the dust mite is found in its feces. A gram of dust may contain 1,000 mites and 250,000 fecal particles. Mite antigen is easily measured in the air during house cleaning activities. Dust mites do not bite and do not cause harm to humans, other than by triggering allergies/asthma.  Ways to decrease your exposure to dust mites in your home:  1. Encase mattresses, box springs and pillows with a mite-impermeable barrier or cover  2. Wash sheets, blankets and drapes weekly in hot water (130 F) with detergent and dry them in a dryer on the hot setting.  3. Have the room cleaned frequently with a vacuum cleaner and a damp dust-mop. For carpeting or rugs, vacuuming with a vacuum cleaner equipped with a high-efficiency particulate air (HEPA) filter. The dust mite allergic individual should not be in a room which is being cleaned and should wait 1 hour after cleaning before going into the room.  4. Do not sleep on upholstered furniture (eg, couches).  5. If possible removing carpeting, upholstered furniture and  drapery from the home is ideal. Horizontal blinds should be eliminated in the rooms where the person spends the most time (bedroom, study, television room). Washable vinyl, roller-type shades are optimal.  6. Remove all non-washable stuffed toys from the bedroom. Wash stuffed toys weekly like sheets and blankets above.  7. Reduce indoor humidity to less than  50%. Inexpensive humidity monitors can be purchased at most hardware stores. Do not use a humidifier as can make the problem worse and are not recommended.  Control of Mold Allergen Mold and fungi can grow on a variety of surfaces provided certain temperature and moisture conditions exist.  Outdoor molds grow on plants, decaying vegetation and soil.  The major outdoor mold, Alternaria and Cladosporium, are found in very high numbers during hot and dry conditions.  Generally, a late Summer - Fall peak is seen for common outdoor fungal spores.  Rain will temporarily lower outdoor mold spore count, but counts rise rapidly when the rainy period ends.  The most important indoor molds are Aspergillus and Penicillium.  Dark, humid and poorly ventilated basements are ideal sites for mold growth.  The next most common sites of mold growth are the bathroom and the kitchen.  Outdoor [employer] Use air conditioning and keep windows closed Avoid exposure to decaying vegetation. Avoid leaf raking. Avoid grain handling. Consider wearing a face mask if working in moldy areas.  Indoor Mold Control Maintain humidity below 50%. Clean washable surfaces with 5% bleach solution. Remove sources e.g. Contaminated carpets.

## 2022-10-26 NOTE — Progress Notes (Signed)
Catherine Turner, [address] [place] [address] Dept: [phone]  FOLLOW UP NOTE  Patient ID: Catherine Turner, female    DOB: 1956/02/25  Age: 67 y.o. MRN: [id number] Date of Office Visit: 10/27/2022  Assessment  Chief Complaint: Follow-up (Allergies )  HPI Catherine Turner is a 67 year old female who presents to the clinic for follow-up visit.  She was last seen in this clinic on 05/26/2022 by Gareth Morgan, FNP, for evaluation of allergic rhinitis, history of asthma, and rash.    At today's visit, she reports that her breathing has been moderately well-controlled with seasonal cough producing mucus as the main symptom.  She denies shortness of breath or wheeze with activity or rest.  She rarely uses albuterol, however, this provides relief when needed.  Allergic rhinitis is reported as moderately well-controlled with occasional rhinorrhea, nasal congestion, and postnasal drainage for which she continues Claritin 10 mg once a day and occasionally uses nasal saline rinses.  She is not currently using any Flonase nasal spray.  Her last environmental allergy skin testing was on 05/26/2022 and was positive to indoor mold, outdoor mold, tree pollen, and dust mite.   She continues to avoid her triggering foods.  Her last food allergy testing was on 01/06/2022 and was negative to the full adult food panel.  She reports occasional red and itchy areas occurring in a flare in remission pattern for which she continues a moisturizing routine with relief of symptoms.  She does report 1 area on her right lateral upper arm which only clears while she is on antibiotics and reoccurs when the antibiotics are finished.  She also reports this area flares with erythema after eating some foods such as crackers.  Her current medications are listed in the chart.   Drug Allergies:  Allergies  Allergen Reactions   Advair Diskus [Fluticasone-Salmeterol] Other (See Comments)    Blood in urine   Ciprofloxacin  Anaphylaxis   Escitalopram Anaphylaxis   Jasmine Fragrance Anaphylaxis   Breo Ellipta [Fluticasone Furoate-Vilanterol] Other (See Comments)    Swelling of the abdomin.    Diclofenac Other (See Comments)    unknown   Furosemide Swelling   Molds & Smuts Other (See Comments)    unknown   Peanuts [Peanut Oil]    Potassium-Containing Compounds Swelling   Pravastatin Other (See Comments)    unknown   Tilactase Other (See Comments)    unknown   Latex Rash   Pork-Derived Products Rash   Sulfa Antibiotics Rash    Physical Exam: BP 130/68   Pulse (!) 102   Temp 97.9 F (36.6 C)   Resp 18   Ht 5' 4.17" (1.63 m)   Wt 267 lb 4 oz (121.2 kg)   SpO2 96%   BMI 45.63 kg/m    Physical Exam Vitals reviewed.  Constitutional:      Appearance: Normal appearance.  HENT:     Head: Normocephalic and atraumatic.     Right Ear: Tympanic membrane normal.     Left Ear: Tympanic membrane normal.     Nose:     Comments: Bilateral nares slightly erythematous with clear nasal drainage noted.  Pharynx normal.  Ears normal.  Eyes normal.    Mouth/Throat:     Pharynx: Oropharynx is clear.  Eyes:     Conjunctiva/sclera: Conjunctivae normal.  Cardiovascular:     Rate and Rhythm: Normal rate and regular rhythm.     Heart sounds: Normal heart sounds. No murmur heard. Pulmonary:  Effort: Pulmonary effort is normal.     Breath sounds: Normal breath sounds.     Comments: Lungs clear to auscultation Musculoskeletal:     Cervical back: Normal range of motion and neck supple.  Skin:    General: Skin is warm and dry.     Comments: Red circular area on the medial aspect of the upper right arm.  No open areas or drainage noted.  Neurological:     Mental Status: She is alert and oriented to person, place, and time.  Psychiatric:        Mood and Affect: Mood normal.        Behavior: Behavior normal.        Thought Content: Thought content normal.        Judgment: Judgment normal.      Diagnostics: FVC 3.12, FEV1 2.57. Predicted FVC 3.02, predicted FEV1 2.36. Spirometry indicated normal ventilatory function.   Assessment and Plan: 1. Seasonal and perennial allergic rhinitis   2. History of asthma   3. Rash and nonspecific skin eruption   4. Food intolerance     Meds ordered this encounter  Medications   albuterol (VENTOLIN HFA) 108 (90 Base) MCG/ACT inhaler    Sig: Inhale 2 puffs into the lungs every 4 (four) hours as needed for wheezing.    Dispense:  1 each    Refill:  prn    Patient Instructions  Allergic rhinitis/conjunctivitis Continue allergen avoidance measures directed toward indoor mold, outdoor mold, tree pollen, and dust mites are listed below Continue Claritin 10 mg once  aday as needed for a runny nose or itch.  Continue Flonase 2 sprays in each nostril once a day as needed for a stuffy nose Consider saline nasal rinses as needed for nasal symptoms. Use this before any medicated nasal sprays for best result Consider saline nasal gel for dry nostrils Consider allergen immunotherapy if your symptoms are not well controlled with the treatment plan as listed above  History of asthma Continue albuterol 2 puffs once every 4 hours as needed for cough or wheeze  Rash If your symptoms re-occur, begin a journal of events that occurred for up to 6 hours before your symptoms began including foods and beverages consumed, soaps or perfumes you had contact with, and medications.  A referral to dermatology may be needed for further evaluation if not resolving  Call the clinic if this treatment plan is not working well for you.  Follow up in 1 year or sooner if needed.   Return in about 1 year (around 10/27/2023), or if symptoms worsen or fail to improve.    Thank you for the opportunity to care for this patient.  Please do not hesitate to contact me with questions.  Gareth Morgan, FNP [facility] [place] [facility] [place]

## 2022-10-27 ENCOUNTER — Ambulatory Visit (INDEPENDENT_AMBULATORY_CARE_PROVIDER_SITE_OTHER): Payer: 59 | Admitting: Family Medicine

## 2022-10-27 ENCOUNTER — Encounter: Payer: Self-pay | Admitting: Family Medicine

## 2022-10-27 ENCOUNTER — Other Ambulatory Visit: Payer: Self-pay

## 2022-10-27 VITALS — BP 130/68 | HR 102 | Temp 97.9°F | Resp 18 | Ht 64.17 in | Wt 267.2 lb

## 2022-10-27 DIAGNOSIS — J302 Other seasonal allergic rhinitis: Secondary | ICD-10-CM

## 2022-10-27 DIAGNOSIS — J3089 Other allergic rhinitis: Secondary | ICD-10-CM | POA: Diagnosis not present

## 2022-10-27 DIAGNOSIS — Z8709 Personal history of other diseases of the respiratory system: Secondary | ICD-10-CM | POA: Diagnosis not present

## 2022-10-27 DIAGNOSIS — R21 Rash and other nonspecific skin eruption: Secondary | ICD-10-CM | POA: Diagnosis not present

## 2022-10-27 DIAGNOSIS — K9049 Malabsorption due to intolerance, not elsewhere classified: Secondary | ICD-10-CM

## 2022-10-27 MED ORDER — ALBUTEROL SULFATE HFA 108 (90 BASE) MCG/ACT IN AERS: 2.0000 | INHALATION_SPRAY | RESPIRATORY_TRACT | 99 refills | Status: AC | PRN

## 2022-11-06 DIAGNOSIS — M9902 Segmental and somatic dysfunction of thoracic region: Secondary | ICD-10-CM | POA: Diagnosis not present

## 2022-11-06 DIAGNOSIS — M9901 Segmental and somatic dysfunction of cervical region: Secondary | ICD-10-CM | POA: Diagnosis not present

## 2022-11-06 DIAGNOSIS — M62830 Muscle spasm of back: Secondary | ICD-10-CM | POA: Diagnosis not present

## 2022-11-06 DIAGNOSIS — M546 Pain in thoracic spine: Secondary | ICD-10-CM | POA: Diagnosis not present

## 2022-11-06 DIAGNOSIS — M9903 Segmental and somatic dysfunction of lumbar region: Secondary | ICD-10-CM | POA: Diagnosis not present

## 2022-11-06 DIAGNOSIS — M542 Cervicalgia: Secondary | ICD-10-CM | POA: Diagnosis not present

## 2022-11-08 DIAGNOSIS — N183 Chronic kidney disease, stage 3 unspecified: Secondary | ICD-10-CM | POA: Diagnosis not present

## 2022-11-08 DIAGNOSIS — Z0000 Encounter for general adult medical examination without abnormal findings: Secondary | ICD-10-CM | POA: Diagnosis not present

## 2022-11-08 DIAGNOSIS — J449 Chronic obstructive pulmonary disease, unspecified: Secondary | ICD-10-CM | POA: Diagnosis not present

## 2022-11-08 DIAGNOSIS — Z299 Encounter for prophylactic measures, unspecified: Secondary | ICD-10-CM | POA: Diagnosis not present

## 2022-11-08 DIAGNOSIS — I739 Peripheral vascular disease, unspecified: Secondary | ICD-10-CM | POA: Diagnosis not present

## 2022-11-08 DIAGNOSIS — Z7189 Other specified counseling: Secondary | ICD-10-CM | POA: Diagnosis not present

## 2022-11-13 DIAGNOSIS — E7800 Pure hypercholesterolemia, unspecified: Secondary | ICD-10-CM | POA: Diagnosis not present

## 2022-11-13 DIAGNOSIS — Z79899 Other long term (current) drug therapy: Secondary | ICD-10-CM | POA: Diagnosis not present

## 2022-11-13 DIAGNOSIS — E538 Deficiency of other specified B group vitamins: Secondary | ICD-10-CM | POA: Diagnosis not present

## 2022-11-13 DIAGNOSIS — E559 Vitamin D deficiency, unspecified: Secondary | ICD-10-CM | POA: Diagnosis not present

## 2022-11-13 DIAGNOSIS — R5383 Other fatigue: Secondary | ICD-10-CM | POA: Diagnosis not present

## 2022-12-06 DIAGNOSIS — Z299 Encounter for prophylactic measures, unspecified: Secondary | ICD-10-CM | POA: Diagnosis not present

## 2022-12-06 DIAGNOSIS — M329 Systemic lupus erythematosus, unspecified: Secondary | ICD-10-CM | POA: Diagnosis not present

## 2022-12-06 DIAGNOSIS — J449 Chronic obstructive pulmonary disease, unspecified: Secondary | ICD-10-CM | POA: Diagnosis not present

## 2022-12-06 DIAGNOSIS — R0989 Other specified symptoms and signs involving the circulatory and respiratory systems: Secondary | ICD-10-CM | POA: Diagnosis not present

## 2022-12-21 DIAGNOSIS — B353 Tinea pedis: Secondary | ICD-10-CM | POA: Diagnosis not present

## 2022-12-21 DIAGNOSIS — C44612 Basal cell carcinoma of skin of right upper limb, including shoulder: Secondary | ICD-10-CM | POA: Diagnosis not present

## 2022-12-21 DIAGNOSIS — X32XXXA Exposure to sunlight, initial encounter: Secondary | ICD-10-CM | POA: Diagnosis not present

## 2022-12-21 DIAGNOSIS — L570 Actinic keratosis: Secondary | ICD-10-CM | POA: Diagnosis not present

## 2023-01-03 DIAGNOSIS — R768 Other specified abnormal immunological findings in serum: Secondary | ICD-10-CM | POA: Diagnosis not present

## 2023-01-03 DIAGNOSIS — M2550 Pain in unspecified joint: Secondary | ICD-10-CM | POA: Diagnosis not present

## 2023-01-03 DIAGNOSIS — D8989 Other specified disorders involving the immune mechanism, not elsewhere classified: Secondary | ICD-10-CM | POA: Diagnosis not present

## 2023-01-03 DIAGNOSIS — R5383 Other fatigue: Secondary | ICD-10-CM | POA: Diagnosis not present

## 2023-01-03 DIAGNOSIS — R634 Abnormal weight loss: Secondary | ICD-10-CM | POA: Diagnosis not present

## 2023-01-03 DIAGNOSIS — R21 Rash and other nonspecific skin eruption: Secondary | ICD-10-CM | POA: Diagnosis not present

## 2023-01-09 DIAGNOSIS — Z299 Encounter for prophylactic measures, unspecified: Secondary | ICD-10-CM | POA: Diagnosis not present

## 2023-01-09 DIAGNOSIS — R1310 Dysphagia, unspecified: Secondary | ICD-10-CM | POA: Diagnosis not present

## 2023-01-09 DIAGNOSIS — R0989 Other specified symptoms and signs involving the circulatory and respiratory systems: Secondary | ICD-10-CM | POA: Diagnosis not present

## 2023-01-16 ENCOUNTER — Ambulatory Visit
Admission: RE | Admit: 2023-01-16 | Discharge: 2023-01-16 | Disposition: A | Discharge status: Home / Self Care | Payer: 59 | Source: Ambulatory Visit | Attending: Internal Medicine | Admitting: Internal Medicine

## 2023-01-16 ENCOUNTER — Other Ambulatory Visit: Payer: Self-pay | Admitting: Internal Medicine

## 2023-01-16 DIAGNOSIS — Z1231 Encounter for screening mammogram for malignant neoplasm of breast: Secondary | ICD-10-CM | POA: Diagnosis not present

## 2023-01-24 NOTE — Progress Notes (Signed)
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 Also is due for a colonoscopy   History of Present Illness   Catherine Turner is a 67 y.o. female presenting today at the request of Vyas, Dhruv B, MD for dysphagia and weight loss.   Colonoscopy 08/12/2014: -polyp at hepatic flexure -colonic diverticulosis -pathology revealed tubular adenoma  No recent abdominal or throat/chest imaging.  Recently had mammogram 01/16/2023.  Today:  Seen by Rheumatology 5/29 and recommended she see GI. Has been sick for a while.  Dermatologist on 5/16 and found basal cell on her arm.  Dysphagia, weight loss - since last July she has had dry mouth and dry eyes and also having trouble with swallowing. She reports she has lost about 30 lbs. Has always had trouble with choking on her first swallow since she was very little. Has numbness to her mouth and unable to taste. She reports her whole mouth and lips have been swollen. Right now she can only eat salmon, tuna. Does not eat red meat like beef but is able to eat bison. Unable to eat shellfish. Was told she has high cholesterol so she tries to avoid high fat. States she is unable to taste as well since July of last year. Seen allergist in June 2023 and got retested for alpha gal by primary care. No N/V.  Takes on otc medication as needed for reflux. (Omeprazole)  Sensitivity to corn and oatmeal.   Tried leaky gut diet in 2019 and reports an allergic reaction to something.   States she drinks 64oz of water daily. States she does eat a lot and eats same as she always has.   2016-after physical activity her lower body shuts down. Seen rheumatology in 2018 and was negative for MS and lupus.   She says if she salts  things she has a reaction within her mouth.  States she has been sensitive to breads since she was 19.  Reports chronic back pain and shoulder pain related to prior elbow fracture. Still has issues with her arm.   Has rashes to her feet and breaks out in her perineum due to allergy to pads. Told it was a hernia in the labial area.   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    Wt Readings from Last 3 Encounters:  01/25/23 260 lb 12.8 oz (118.3 kg)  10/27/22 267 lb 4 oz (121.2 kg)  06/01/22 272 lb 6.4 oz (123.6 kg)    Current Outpatient Medications  Medication Sig Dispense Refill   albuterol (PROVENTIL) (2.5 MG/3ML) 0.083% nebulizer solution Take 3 mLs (2.5 mg total) by nebulization every 6 (six) hours as needed for wheezing. 75 mL prn   albuterol (VENTOLIN HFA) 108 (90 Base) MCG/ACT inhaler Inhale 2 puffs into the lungs every 4 (four) hours as needed for wheezing. 1 each prn   clobetasol cream (TEMOVATE) 0.05 %      dextromethorphan-guaiFENesin (MUCINEX DM) 30-600 MG 12hr tablet Take by mouth.     EPINEPHrine 0.3 mg/0.3  mL IJ SOAJ injection Inject into the muscle as needed.     HYDROcodone-acetaminophen (NORCO/VICODIN) 5-325 MG tablet Take by mouth as needed.      ketoconazole (NIZORAL) 2 % cream SMARTSIG:1 Application Topical 1 to 2 Times Daily     LUMIGAN 0.01 % SOLN SMARTSIG:In Eye(s)     naproxen sodium (ALEVE) 220 MG tablet Take 220 mg by mouth as needed.     RESTASIS 0.05 % ophthalmic emulsion 1 drop 2 (two) times daily.     TIROSINT-SOL 100 MCG/ML SOLN Take by mouth.     No current facility-administered medications for this visit.    Past Medical History:  Diagnosis Date   Angio-edema    Arm fracture    right arm    Asthma    Bulging lumbar disc    Complication of anesthesia     pt sts, "I am a very shallow breather and they have to do something special for me"/   COPD (chronic obstructive pulmonary disease) (HCC)    Degenerative joint disease (DJD) of hip    DJD (degenerative joint disease) of hip    Eczema    Elevated LFTs    Hip pain    Hypothyroidism    Insomnia    Nerve damage    right arm.   PTSD (post-traumatic stress disorder)    Sleep apnea    has CPAP   Thyroid disease    Urticaria     Past Surgical History:  Procedure Laterality Date   COLONOSCOPY N/A 08/12/2014   Procedure: COLONOSCOPY;  Surgeon: Corbin Ade, MD;  Location: [facility];  Service: Endoscopy;  Laterality: N/A;  115   thumb surgery     TOOTH EXTRACTION  04/2022   UMBILICAL HERNIA REPAIR N/A 12/30/2014   Procedure: HERNIA REPAIR UMBILICAL ADULT WITH MESH;  Surgeon: Franky Macho Md, MD;  Location: [facility];  Service: General;  Laterality: N/A;    Family History  Problem Relation Age of Onset   COPD Mother    Cancer - Lung Mother    Cancer Father    Hypertension Father    Drug abuse Sister    Dementia Maternal Aunt    Alcohol abuse Paternal Aunt    Alcohol abuse Paternal Uncle    Heart disease Maternal Grandmother    Heart disease Maternal Grandfather    Heart disease Paternal Grandmother    Heart disease Paternal Grandfather    Heart attack Paternal Grandfather    Physical abuse Other    Anxiety disorder Other    Birth defects Other        Downs Synddrome   ADD / ADHD Neg Hx    Bipolar disorder Neg Hx    OCD Neg Hx    Paranoid behavior Neg Hx    Schizophrenia Neg Hx    Seizures Neg Hx    Sexual abuse Neg Hx    Allergic rhinitis Neg Hx    Asthma Neg Hx    Eczema Neg Hx    Urticaria Neg Hx    Breast cancer Neg Hx     Allergies as of 01/25/2023 - Review Complete 01/25/2023  Allergen Reaction Noted   Advair diskus [fluticasone-salmeterol] Other (See Comments) 11/29/2012   Ciprofloxacin Anaphylaxis 12/07/2017   Escitalopram Anaphylaxis 08/04/2014    Jasmine fragrance Anaphylaxis 10/30/2011   Breo ellipta [fluticasone furoate-vilanterol] Other (See Comments) 08/04/2014   Diclofenac Other (See Comments) 01/13/2013   Furosemide Swelling 01/26/2016   Molds & smuts Other (See Comments)  03/15/2013   Peanuts [peanut oil]  11/29/2014   Potassium-containing compounds Swelling 01/26/2016   Pravastatin Other (See Comments) 11/29/2012   Tilactase Other (See Comments) 11/29/2012   Latex Rash 10/30/2011   Pork-derived products Rash 10/30/2011   Sulfa antibiotics Rash 10/30/2011    Social History   Socioeconomic History   Marital status: Single    Spouse name: Not on file   Number of children: Not on file   Years of education: Not on file   Highest education level: Not on file  Occupational History   Not on file  Tobacco Use   Smoking status: Never    Passive exposure: Yes   Smokeless tobacco: Never  Vaping Use   Vaping Use: Never used  Substance and Sexual Activity   Alcohol use: Yes    Comment: 4 oz wine    Drug use: No   Sexual activity: Never    Birth control/protection: None  Other Topics Concern   Not on file  Social History Narrative   Not on file   Social Determinants of Health   Financial Resource Strain: Not on file  Food Insecurity: Not on file  Transportation Needs: Not on file  Physical Activity: Not on file  Stress: Not on file  Social Connections: Not on file  Intimate Partner Violence: Not on file     Review of Systems   Gen: Denies any fever, chills, fatigue, weight loss, lack of appetite.  CV: Denies chest pain, heart palpitations, peripheral edema, syncope.  Resp: Denies shortness of breath at rest or with exertion. Denies wheezing or cough.  GI: see HPI GU : Denies urinary burning, urinary frequency, urinary hesitancy MS: Denies joint pain, muscle weakness, cramps, or limitation of movement.  Derm: Denies rash, itching, dry skin Psych: Denies depression, anxiety, memory loss, and  confusion Heme: Denies bruising, bleeding, and enlarged lymph nodes.  Physical Exam   BP (!) 162/112 (BP Location: Left Arm, Patient Position: Sitting, Cuff Size: Large)   Pulse (!) 103   Temp 98.4 F (36.9 C) (Oral)   Ht 5\' 4"  (1.626 m)   Wt 260 lb 12.8 oz (118.3 kg)   SpO2 100%   BMI 44.77 kg/m   General:   Alert and oriented. Pleasant and cooperative. Well-nourished and well-developed.  Head:  Normocephalic and atraumatic. Eyes:  Without icterus, sclera clear and conjunctiva pink.  Ears:  Normal auditory acuity. Mouth:  No deformity or lesions, oral mucosa pink.  Lungs:  Clear to auscultation bilaterally. No wheezes, rales, or rhonchi. No distress.  Heart:  S1, S2 present without murmurs appreciated.  Abdomen:  +BS, soft, non-tender and non-distended. No HSM noted. No guarding or rebound. No masses appreciated.  Rectal:  Deferred  Msk:  Symmetrical without gross deformities. Normal posture. Extremities:  Without edema. Neurologic:  Alert and  oriented x4;  grossly normal neurologically. Skin:  Intact without significant lesions or rashes. Psych:  Alert and cooperative. Normal mood and affect.   Assessment   ALAHNI VARONE is a 67 y.o. female with a history of HTN, OSA, obesity. Hypothyroidism, asthma, COPD, CKD stage III, anxiety, OA, polymyalgia rheumatica, PVD presenting today with complain of dysphagia and need to schedule colonoscopy.   Hx of adenomatous colon polyps: Colonoscopy in 2016 with single tubular adenoma at hepatic flexure. Likely 10 year repeat recommended however given she is undergoing EGD and she has weight loss she would like to complete colonoscopy as well.   Dysphagia, Weight loss: Patient has reported multiple  allergies to different medications as well as foods.  She has intermittent reflux for which she uses omeprazole.  She states since July of last year she has had dry mouth as well as dry eyes which has resulted in trouble swallowing.  This is  described as her having difficulties with her very for swallow and at times food feeling like it stuck mid chest.  She reports she has been trying to undergo evaluation for Sjogren's and just recently got established with rheumatology.  She also has reported numbness to her mouth and inability to taste different foods and at times has swelling of her mouth and lips and due to this she has only been able to eat certain foods and generally has been avoiding red meats, mostly eating salmon and tuna for protein intake.  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  Rheumatology has asked for to be evaluated endoscopically to assess for any source of weight loss and dysphagia.  She will continue omeprazole as needed and we will plan to perform EGD with possible dilation.  Differentials for dysphagia include esophageal web, ring, stricture, stenosis, or unidentified motility disorder.  Given her extensive allergies EOE also remains within the differential.  PLAN   Proceed with upper endoscopy with possible dilation and colonoscopy propofol by Dr. Jena Gauss in near future: the risks, benefits, and alternatives have been discussed with the patient in detail. The patient states understanding and desires to proceed. ASA 3 Continue omeprazole as needed.  Follow up in 3 months   Brooke Bonito, MSN, FNP-BC, AGACNP-BC [facility]

## 2023-01-25 ENCOUNTER — Ambulatory Visit (INDEPENDENT_AMBULATORY_CARE_PROVIDER_SITE_OTHER): Payer: 59 | Admitting: Gastroenterology

## 2023-01-25 ENCOUNTER — Encounter: Payer: Self-pay | Admitting: Gastroenterology

## 2023-01-25 VITALS — BP 119/78 | HR 87 | Temp 98.4°F | Ht 64.0 in | Wt 260.8 lb

## 2023-01-25 DIAGNOSIS — R1310 Dysphagia, unspecified: Secondary | ICD-10-CM | POA: Diagnosis not present

## 2023-01-25 DIAGNOSIS — Z86010 Personal history of colonic polyps: Secondary | ICD-10-CM

## 2023-01-25 DIAGNOSIS — R634 Abnormal weight loss: Secondary | ICD-10-CM

## 2023-01-25 NOTE — Patient Instructions (Addendum)
We are scheduling you for a colonoscopy and upper endoscopy in the near future to  evaluate your swallowing and your weight loss.  Continue to take omeprazole as needed.  Will plan to see you in 3 months, sooner if needed.  It was a pleasure to see you today. I want to create trusting relationships with patients. If you receive a survey regarding your visit,  I greatly appreciate you taking time to fill this out on paper or through your MyChart. I value your feedback.  Brooke Bonito, MSN, FNP-BC, AGACNP-BC [facility]

## 2023-01-30 ENCOUNTER — Encounter: Payer: Self-pay | Admitting: Gastroenterology

## 2023-01-31 DIAGNOSIS — M62830 Muscle spasm of back: Secondary | ICD-10-CM | POA: Diagnosis not present

## 2023-01-31 DIAGNOSIS — M9902 Segmental and somatic dysfunction of thoracic region: Secondary | ICD-10-CM | POA: Diagnosis not present

## 2023-01-31 DIAGNOSIS — M546 Pain in thoracic spine: Secondary | ICD-10-CM | POA: Diagnosis not present

## 2023-01-31 DIAGNOSIS — M542 Cervicalgia: Secondary | ICD-10-CM | POA: Diagnosis not present

## 2023-01-31 DIAGNOSIS — M9903 Segmental and somatic dysfunction of lumbar region: Secondary | ICD-10-CM | POA: Diagnosis not present

## 2023-01-31 DIAGNOSIS — M9901 Segmental and somatic dysfunction of cervical region: Secondary | ICD-10-CM | POA: Diagnosis not present

## 2023-02-07 ENCOUNTER — Telehealth: Payer: Self-pay | Admitting: *Deleted

## 2023-02-07 NOTE — Telephone Encounter (Signed)
Pt left vm stating she is having some issues and has to see a heart doctor on 7/30. She wants to hold off on scheduling EGD/colonoscopy until she finds out what is going on. I left her a vm stating provider is booking out into August but for her to call with update. FYI

## 2023-02-09 NOTE — Telephone Encounter (Signed)
noted 

## 2023-02-14 DIAGNOSIS — M3500 Sicca syndrome, unspecified: Secondary | ICD-10-CM | POA: Diagnosis not present

## 2023-02-14 DIAGNOSIS — Z299 Encounter for prophylactic measures, unspecified: Secondary | ICD-10-CM | POA: Diagnosis not present

## 2023-02-14 DIAGNOSIS — R0989 Other specified symptoms and signs involving the circulatory and respiratory systems: Secondary | ICD-10-CM | POA: Diagnosis not present

## 2023-02-14 DIAGNOSIS — M329 Systemic lupus erythematosus, unspecified: Secondary | ICD-10-CM | POA: Diagnosis not present

## 2023-02-14 DIAGNOSIS — I739 Peripheral vascular disease, unspecified: Secondary | ICD-10-CM | POA: Diagnosis not present

## 2023-02-14 DIAGNOSIS — J449 Chronic obstructive pulmonary disease, unspecified: Secondary | ICD-10-CM | POA: Diagnosis not present

## 2023-02-21 ENCOUNTER — Encounter: Payer: Self-pay | Admitting: Plastic Surgery

## 2023-02-21 ENCOUNTER — Telehealth: Payer: Self-pay | Admitting: *Deleted

## 2023-02-21 ENCOUNTER — Ambulatory Visit: Payer: 59 | Admitting: Plastic Surgery

## 2023-02-21 VITALS — BP 135/85 | HR 76 | Ht 64.0 in | Wt 258.8 lb

## 2023-02-21 DIAGNOSIS — C44612 Basal cell carcinoma of skin of right upper limb, including shoulder: Secondary | ICD-10-CM | POA: Diagnosis not present

## 2023-02-21 NOTE — Progress Notes (Signed)
Referring Provider Ignatius Specking, MD [address] [place],  [place] [address]   CC:  Chief Complaint  Patient presents with   Consult      Catherine Turner is an 67 y.o. female.  HPI: Catherine Turner is a 67 year old female who is referred from her dermatologist for evaluation of a 3 cm lesion on her right biceps.  She has a paper pathology [place] that she has brought whether that states the lesion is a biopsy-proven basal cell carcinoma.  The patient reports that the lesion has been there for many years, probably more than 10.  It was thought to be a superficial skin infection and indeed changed in size and appearance with use of antifungals and antibiotics.  Her dermatologist became suspicious when the wound developed crusting and its center.  A shave biopsy was performed. The patient has multiple other medical comorbidities including autoimmune diseases and multiple food and drug allergies.  She also states that she has an aortic arch deformity that is being evaluated next week by a cardiologist.  Allergies  Allergen Reactions   Advair Diskus [Fluticasone-Salmeterol] Other (See Comments)    Blood in urine   Ciprofloxacin Anaphylaxis   Escitalopram Anaphylaxis   Jasmine Fragrance Anaphylaxis   Breo Ellipta [Fluticasone Furoate-Vilanterol] Other (See Comments)    Swelling of the abdomin.    Diclofenac Other (See Comments)    unknown   Furosemide Swelling   Molds & Smuts Other (See Comments)    unknown   Peanuts [Peanut Oil]    Potassium-Containing Compounds Swelling   Pravastatin Other (See Comments)    unknown   Tilactase Other (See Comments)    unknown   Latex Rash   Pork-Derived Products Rash   Sulfa Antibiotics Rash    Outpatient Encounter Medications as of 02/21/2023  Medication Sig Note   albuterol (PROVENTIL) (2.5 MG/3ML) 0.083% nebulizer solution Take 3 mLs (2.5 mg total) by nebulization every 6 (six) hours as needed for wheezing. 05/16/2022: prn   albuterol (VENTOLIN HFA)  108 (90 Base) MCG/ACT inhaler Inhale 2 puffs into the lungs every 4 (four) hours as needed for wheezing.    Ascorbic Acid (VITAMIN C) 500 MG CAPS Take 500 mg by mouth daily.    cholecalciferol (VITAMIN D3) 25 MCG (1000 UNIT) tablet Take 1,000 Units by mouth daily.    clobetasol cream (TEMOVATE) 0.05 %     dextromethorphan-guaiFENesin (MUCINEX DM) 30-600 MG 12hr tablet Take by mouth. 05/16/2022: Prrn    diphenhydrAMINE (BENADRYL) 50 MG tablet Take 50 mg by mouth at bedtime as needed for itching (25-50 mg).    EPINEPHrine 0.3 mg/0.3 mL IJ SOAJ injection Inject into the muscle as needed. 05/16/2022: prn   HYDROcodone-acetaminophen (NORCO/VICODIN) 5-325 MG tablet Take by mouth as needed.  05/16/2022: prn   ketoconazole (NIZORAL) 2 % cream SMARTSIG:1 Application Topical 1 to 2 Times Daily    loratadine (CLARITIN) 10 MG tablet Take 10 mg by mouth daily.    LUMIGAN 0.01 % SOLN SMARTSIG:In Eye(s)    Melatonin 10 MG TABS Take 10 mg by mouth at bedtime.    naproxen sodium (ALEVE) 220 MG tablet Take 220 mg by mouth as needed.    RESTASIS 0.05 % ophthalmic emulsion 1 drop 2 (two) times daily.    TIROSINT-SOL 100 MCG/ML SOLN Take by mouth.    vitamin B-12 (CYANOCOBALAMIN) 500 MCG tablet Take 500 mcg by mouth daily. 1-2 daily    DHEA 25 MG CAPS Take 25 mg by mouth daily.  No facility-administered encounter medications on file as of 02/21/2023.     Past Medical History:  Diagnosis Date   Angio-edema    Arm fracture    right arm    Asthma    Bulging lumbar disc    Complication of anesthesia    pt sts, "I am a very shallow breather and they have to do something special for me"/   COPD (chronic obstructive pulmonary disease) (HCC)    Degenerative joint disease (DJD) of hip    DJD (degenerative joint disease) of hip    Eczema    Elevated LFTs    Hip pain    Hypothyroidism    Insomnia    Nerve damage    right arm.   PTSD (post-traumatic stress disorder)    Sleep apnea    has CPAP   Thyroid  disease    Urticaria     Past Surgical History:  Procedure Laterality Date   COLONOSCOPY N/A 08/12/2014   Procedure: COLONOSCOPY;  Surgeon: Corbin Ade, MD;  Location: [facility];  Service: Endoscopy;  Laterality: N/A;  115   thumb surgery     TOOTH EXTRACTION  04/2022   UMBILICAL HERNIA REPAIR N/A 12/30/2014   Procedure: HERNIA REPAIR UMBILICAL ADULT WITH MESH;  Surgeon: Franky Macho Md, MD;  Location: [facility];  Service: General;  Laterality: N/A;    Family History  Problem Relation Age of Onset   COPD Mother    Cancer - Lung Mother    Cancer Father    Hypertension Father    Drug abuse Sister    Dementia Maternal Aunt    Alcohol abuse Paternal Aunt    Alcohol abuse Paternal Uncle    Heart disease Maternal Grandmother    Heart disease Maternal Grandfather    Heart disease Paternal Grandmother    Heart disease Paternal Grandfather    Heart attack Paternal Grandfather    Physical abuse Other    Anxiety disorder Other    Birth defects Other        Downs Synddrome   ADD / ADHD Neg Hx    Bipolar disorder Neg Hx    OCD Neg Hx    Paranoid behavior Neg Hx    Schizophrenia Neg Hx    Seizures Neg Hx    Sexual abuse Neg Hx    Allergic rhinitis Neg Hx    Asthma Neg Hx    Eczema Neg Hx    Urticaria Neg Hx    Breast cancer Neg Hx     Social History   Social History Narrative   Not on file     Review of Systems General: Denies fevers, chills, weight loss CV: Denies chest pain, shortness of breath, palpitations Skin: 3 cm circular lesion on the anterior biceps of the right arm.  Patient denies any pain but does state that it occasionally itches. She also has a rash on both feet most extensive on the right foot.  Physical Exam    02/21/2023   11:02 AM 01/25/2023   11:02 AM 01/25/2023   11:00 AM  Vitals with BMI  Height 5\' 4"   5\' 4"   Weight 258 lbs 13 oz  260 lbs 13 oz  BMI 44.4  44.74  Systolic 135 119 259  Diastolic 85 78 112  Pulse 76 87 103    General:  No  acute distress,  Alert and oriented, Non-Toxic, Normal speech and affect Skin: As noted there is a 3 cm circular lesion on  the anterior portion of the right bicep.  The lesion is essentially circular and there is good tissue laxity around it.  There is excoriation in the center of the lesion but no drainage today. Mammogram: Mammogram June 2024 was BI-RADS 1 Assessment/Plan Skin lesion, basal cell carcinoma: The lesion would be amenable to resection in the operating room.  Patient understands that I will not be able to determine margins until the pathology report returns and that she will require additional procedures if there is any indication of positive margins. Her dermatologist is also requested biopsy of the rash on the right foot.  Will include this at the time of surgery.  Santiago Glad 02/21/2023, 11:48 AM

## 2023-02-21 NOTE — Telephone Encounter (Signed)
See previous TE

## 2023-02-21 NOTE — Telephone Encounter (Signed)
Pt says she will being seeing cardiology on 03/06/23. She says she will call back to schedule an office visit after finds out what's going on.

## 2023-02-21 NOTE — Telephone Encounter (Signed)
Lmovm TO CALLL BACK TO SCHEDULE TCS/EGD/ED w/ propofol asa 3 with Dr. Jena Gauss

## 2023-03-06 ENCOUNTER — Ambulatory Visit: Payer: 59 | Admitting: Internal Medicine

## 2023-03-06 ENCOUNTER — Telehealth: Payer: Self-pay | Admitting: Internal Medicine

## 2023-03-06 ENCOUNTER — Encounter: Payer: Self-pay | Admitting: Internal Medicine

## 2023-03-06 VITALS — BP 138/80 | HR 78 | Ht 64.0 in | Wt 259.0 lb

## 2023-03-06 DIAGNOSIS — Z136 Encounter for screening for cardiovascular disorders: Secondary | ICD-10-CM | POA: Diagnosis not present

## 2023-03-06 DIAGNOSIS — E781 Pure hyperglyceridemia: Secondary | ICD-10-CM | POA: Diagnosis not present

## 2023-03-06 DIAGNOSIS — E7800 Pure hypercholesterolemia, unspecified: Secondary | ICD-10-CM | POA: Insufficient documentation

## 2023-03-06 DIAGNOSIS — Q254 Congenital malformation of aorta unspecified: Secondary | ICD-10-CM | POA: Insufficient documentation

## 2023-03-06 DIAGNOSIS — I712 Thoracic aortic aneurysm, without rupture, unspecified: Secondary | ICD-10-CM

## 2023-03-06 NOTE — Progress Notes (Signed)
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  Patient underwent a CT angio right upper extremity in 2015 at [facility] that showed bovine aortic arch for which patient was referred to cardiology clinic for further evaluation. Currently she is seeing gastroenterology for dysphagia workup.  No angina, DOE, leg swelling, dizziness, syncope.  Overall doing great.  She was recently diagnosed with a connective tissue disease,?  Sjogren's syndrome (self-reported).  Her mother had heart issues but she never shared with the patient.  No prior history of MI/PCI/CABG.  Past Medical History:  Diagnosis Date   Angio-edema    Arm fracture    right arm    Asthma    Bulging lumbar disc    Complication of anesthesia    pt sts, "I am a very shallow breather and they have to do something special for me"/   COPD (chronic obstructive pulmonary disease) (HCC)    Degenerative joint disease (DJD) of hip    DJD (degenerative joint disease) of hip    Eczema    Elevated LFTs    Hip pain    Hypothyroidism    Insomnia    Nerve damage    right arm.   PTSD (post-traumatic stress disorder)    Sleep apnea    has CPAP   Thyroid disease    Urticaria     Past Surgical History:  Procedure Laterality Date   COLONOSCOPY N/A 08/12/2014   Procedure: COLONOSCOPY;  Surgeon: Corbin Ade, MD;  Location: [facility];  Service: Endoscopy;  Laterality: N/A;  115   thumb surgery     TOOTH EXTRACTION  04/2022   UMBILICAL HERNIA REPAIR N/A 12/30/2014   Procedure: HERNIA REPAIR UMBILICAL ADULT WITH MESH;  Surgeon: Franky Macho Md, MD;  Location: [facility];   Service: General;  Laterality: N/A;    Current Outpatient Medications  Medication Sig Dispense Refill   albuterol (PROVENTIL) (2.5 MG/3ML) 0.083% nebulizer solution Take 3 mLs (2.5 mg total) by nebulization every 6 (six) hours as needed for wheezing. 75 mL prn   albuterol (VENTOLIN HFA) 108 (90 Base) MCG/ACT inhaler Inhale 2 puffs into the lungs every 4 (four) hours as needed for wheezing. 1 each prn   Ascorbic Acid (VITAMIN C) 500 MG CAPS Take 500 mg by mouth daily.     cholecalciferol (VITAMIN D3) 25 MCG (1000 UNIT) tablet Take 1,000 Units by mouth daily.     clobetasol cream (TEMOVATE) 0.05 %      dextromethorphan-guaiFENesin (MUCINEX DM) 30-600 MG 12hr tablet Take by mouth.     diphenhydrAMINE (BENADRYL) 50 MG tablet Take 50 mg by mouth at bedtime as needed for itching (25-50 mg).     EPINEPHrine 0.3 mg/0.3 mL IJ SOAJ injection Inject into the muscle as needed.     HYDROcodone-acetaminophen (NORCO/VICODIN) 5-325 MG tablet Take by mouth as needed.      ketoconazole (NIZORAL) 2 % cream SMARTSIG:1 Application Topical 1 to 2 Times Daily     loratadine (CLARITIN) 10 MG tablet Take 10 mg by mouth daily.     LUMIGAN 0.01 % SOLN SMARTSIG:In Eye(s)  Melatonin 10 MG TABS Take 10 mg by mouth at bedtime.     naproxen sodium (ALEVE) 220 MG tablet Take 220 mg by mouth as needed.     RESTASIS 0.05 % ophthalmic emulsion 1 drop 2 (two) times daily.     TIROSINT-SOL 100 MCG/ML SOLN Take by mouth.     vitamin B-12 (CYANOCOBALAMIN) 500 MCG tablet Take 500 mcg by mouth daily. 1-2 daily     DHEA 25 MG CAPS Take 25 mg by mouth daily.     No current facility-administered medications for this visit.   Allergies:  Advair diskus [fluticasone-salmeterol], Ciprofloxacin, Escitalopram, Jasmine fragrance, Breo ellipta [fluticasone furoate-vilanterol], Diclofenac, Furosemide, Molds & smuts, Peanuts [peanut oil], Potassium-containing compounds, Pravastatin, Tilactase, Latex, Pork-derived products, and Sulfa  antibiotics   Social History: The patient  reports that she has never smoked. She has been exposed to tobacco smoke. She has never used smokeless tobacco. She reports current alcohol use. She reports that she does not use drugs.   Family History: The patient's family history includes Alcohol abuse in her paternal aunt and paternal uncle; Anxiety disorder in an other family member; Birth defects in an other family member; COPD in her mother; Cancer in her father; Cancer - Lung in her mother; Dementia in her maternal aunt; Drug abuse in her sister; Heart attack in her paternal grandfather; Heart disease in her maternal grandfather, maternal grandmother, paternal grandfather, and paternal grandmother; Hypertension in her father; Physical abuse in an other family member.   ROS:  Please see the history of present illness. Otherwise, complete review of systems is positive for none  All other systems are reviewed and negative.   Physical Exam: VS:  BP 138/80   Pulse 78   Ht 5\' 4"  (1.626 m)   Wt 259 lb (117.5 kg)   SpO2 97%   BMI 44.46 kg/m , BMI Body mass index is 44.46 kg/m.  Wt Readings from Last 3 Encounters:  03/06/23 259 lb (117.5 kg)  02/21/23 258 lb 12.8 oz (117.4 kg)  01/25/23 260 lb 12.8 oz (118.3 kg)    General: Patient appears comfortable at rest. HEENT: Conjunctiva and lids normal, oropharynx clear with moist mucosa. Neck: Supple, no elevated JVP or carotid bruits, no thyromegaly. Lungs: Clear to auscultation, nonlabored breathing at rest. Cardiac: Regular rate and rhythm, no S3 or significant systolic murmur, no pericardial rub. Abdomen: Soft, nontender, no hepatomegaly, bowel sounds present, no guarding or rebound. Extremities: No pitting edema, distal pulses 2+. Skin: Warm and dry. Musculoskeletal: No kyphosis. Neuropsychiatric: Alert and oriented x3, affect grossly appropriate.  Recent Labwork: No results found for requested labs within last 365 days.     Component Value  Date/Time   CHOL 221 (A) 03/17/2022 0000   TRIG 327 (A) 03/17/2022 0000   HDL 42 03/17/2022 0000   LDLCALC 132 03/17/2022 0000    Assessment and Plan:  # Bovine aortic arch per CT imaging in 2015 -In patients with bovine arch, imaging to assess for thoracic aortic aneurysm is reasonable according to ACC/AHA guidelines. This is a class IIb indication. Obtain CTA chest/aorta to assess for TAA.  # Hypertriglyceridemia # Hypercholesterolemia -LDL from 4/24 reviewed, total cholesterol 240, TG 227, LDL 147.  LDL and TG elevated. Start rosuvastatin 10 mg nightly if patient agreeable.   I have spent a total of 45 minutes with patient reviewing chart, EKGs, labs and examining patient as well as establishing an assessment and plan that was discussed with the patient.  > 50%  of time was spent in direct patient care.    Medication Adjustments/Labs and Tests Ordered: Current medicines are reviewed at length with the patient today.  Concerns regarding medicines are outlined above.   Tests Ordered: Orders Placed This Encounter  Procedures   CT ANGIO CHEST AORTA W/CM & OR WO/CM   Basic metabolic panel   EKG 12-Lead    Medication Changes: No orders of the defined types were placed in this encounter.   Disposition:  Follow up  pending results  Signed Amirr Achord Verne Spurr, MD, 03/06/2023 2:52 PM    [facility] at [facility] [address] [place], [place], [place] [address]

## 2023-03-06 NOTE — Addendum Note (Signed)
Addended by: Alfonse Spruce on: 03/06/2023 07:00 AM   Modules accepted: Level of Service

## 2023-03-06 NOTE — Patient Instructions (Addendum)
Medication Instructions:  Your physician recommends that you continue on your current medications as directed. Please refer to the Current Medication list given to you today.   Labwork: BMET one week prior to test  Testing/Procedures: CTA Aorta  Follow-Up: Your physician recommends that you schedule a follow-up appointment in: Pending Results  Any Other Special Instructions Will Be Listed Below (If Applicable).  If you need a refill on your cardiac medications before your next appointment, please call your pharmacy.

## 2023-03-06 NOTE — Telephone Encounter (Signed)
Checking percert on the following patient for testing scheduled at [facility].    CT ANGIO CHEST   03-13-2023

## 2023-03-13 ENCOUNTER — Ambulatory Visit (HOSPITAL_COMMUNITY)
Admission: RE | Admit: 2023-03-13 | Discharge: 2023-03-13 | Disposition: A | Discharge status: Home / Self Care | Payer: 59 | Source: Ambulatory Visit | Attending: Internal Medicine | Admitting: Internal Medicine

## 2023-03-13 ENCOUNTER — Encounter (HOSPITAL_COMMUNITY): Payer: Self-pay | Admitting: Radiology

## 2023-03-13 DIAGNOSIS — I712 Thoracic aortic aneurysm, without rupture, unspecified: Secondary | ICD-10-CM | POA: Insufficient documentation

## 2023-03-13 MED ORDER — IOHEXOL 350 MG/ML SOLN
75.0000 mL | Freq: Once | INTRAVENOUS | Status: AC | PRN
  Administered 2023-03-13: 75 mL via INTRAVENOUS

## 2023-03-14 ENCOUNTER — Telehealth: Payer: Self-pay | Admitting: Plastic Surgery

## 2023-03-14 NOTE — Telephone Encounter (Signed)
Pt called and wanted to tell Dr Ladona Ridgel that she did her CT done yesterday and her results are up. Thank you

## 2023-03-14 NOTE — Telephone Encounter (Signed)
Thank you. I think she was confused, her cardiologist ordered that.

## 2023-03-15 ENCOUNTER — Telehealth: Payer: Self-pay | Admitting: *Deleted

## 2023-03-15 ENCOUNTER — Telehealth: Payer: Self-pay

## 2023-03-15 NOTE — Telephone Encounter (Signed)
Request for cardiac surgical clearance faxed to Dr. Luane School (Fax # [phone]) with fax confirmation received.

## 2023-03-15 NOTE — Telephone Encounter (Signed)
Pt called and lmom stating that her CT results are available on myChart and was wanting to know what the next steps are.

## 2023-03-15 NOTE — Telephone Encounter (Signed)
Noted  

## 2023-03-15 NOTE — Telephone Encounter (Signed)
Dr. Jenene Slicker , patient's chart was reviewed for preoperative cardiac evaluation.  He/she was seen by you on 03/06/2023 and according to protocol, we request that you comment on cardiac risk for upcoming procedure since office visit was less than 2 months ago.   You ordered a CT Angio chest Aorta in the setting of known Bovine aortic arch. This was completed on 03/13/2023.  I do not see a result note from you on this however,  the radiologist report stated:   IMPRESSION: 1. No thoracic aortic aneurysm.  Bovine arch configuration. 2. Mild splenomegaly. 3. Benign left adrenal myelolipoma. This needs no further imaging follow-up.   The patient is scheduled to have a low risk procedure:  excision of basal cell carcinoma, right upper arm, biopsies of skin, left foot on date to be determined.    Please route your response to p cv div preop.  Thank you, Joni Reining DNP, ANP, AACC

## 2023-03-15 NOTE — Telephone Encounter (Signed)
   Pre-operative Risk Assessment    Patient Name: Catherine Turner  DOB: 07/24/1956 MRN: [id number]      Request for Surgical Clearance    Procedure:   excision of basal cell carcinoma, right upper arm, biopsies of skin, left foot  Date of Surgery:  Clearance TBD                                 Surgeon:  Dr. Weyman Croon Surgeon's Group or Practice Name:  [facility] Phone number:  [phone] Fax number:  [phone]   Type of Clearance Requested:   - Medical    Type of Anesthesia:  Not Indicated   Additional requests/questions:  Please advise surgeon/provider what medications should be held. Length of surgery - 1 hour Please return ASAP Please return to [employer]   03/15/2023, 11:08 AM

## 2023-03-15 NOTE — Telephone Encounter (Signed)
Pt says she wishes to hold off until after surgery for her arm. She says she will give [place] a call back once she is ready.

## 2023-03-19 ENCOUNTER — Encounter: Payer: Self-pay | Admitting: *Deleted

## 2023-03-19 NOTE — Telephone Encounter (Signed)
   Patient Name: Catherine Turner  DOB: 11-24-1955 MRN: [id number]  Primary Cardiologist: Marjo Bicker, MD  Chart reviewed as part of pre-operative protocol coverage. Pre-op clearance already addressed by colleagues in earlier phone notes. To summarize recommendations:  - Patient is at a low risk for a low risk procedure.  -Dr. Jenene Slicker  She can proceed without further cardiac testing.  Will route this bundled recommendation to requesting provider via Epic fax function and remove from pre-op pool. Please call with questions.  Sharlene Dory, PA-C 03/19/2023, 7:40 AM

## 2023-03-27 ENCOUNTER — Encounter: Payer: Self-pay | Admitting: Gastroenterology

## 2023-04-12 ENCOUNTER — Ambulatory Visit (INDEPENDENT_AMBULATORY_CARE_PROVIDER_SITE_OTHER): Payer: 59 | Admitting: Surgical

## 2023-04-12 ENCOUNTER — Encounter: Payer: Self-pay | Admitting: Surgical

## 2023-04-12 VITALS — BP 148/85 | HR 63 | Ht 65.0 in | Wt 256.6 lb

## 2023-04-12 DIAGNOSIS — C44612 Basal cell carcinoma of skin of right upper limb, including shoulder: Secondary | ICD-10-CM

## 2023-04-12 MED ORDER — HYDROCODONE-ACETAMINOPHEN 5-325 MG PO TABS: 1.0000 | ORAL_TABLET | Freq: Four times a day (QID) | ORAL | 0 refills | Status: AC | PRN

## 2023-04-12 MED ORDER — ONDANSETRON HCL 4 MG PO TABS: 4.0000 mg | ORAL_TABLET | Freq: Three times a day (TID) | ORAL | 0 refills | Status: AC | PRN

## 2023-04-12 NOTE — Progress Notes (Addendum)
Patient ID: Catherine Turner, female    DOB: April 22, 1956, 67 y.o.   MRN: [id number]  Chief Complaint  Patient presents with   Pre-op Exam      ICD-10-CM   1. Basal cell carcinoma (BCC) of skin of right upper extremity including shoulder  C44.612       History of Present Illness: Catherine Turner is a 67 y.o.  female  with a history of basal cell carcinoma of right upper extremity..  She presents for preoperative evaluation for upcoming procedure, excision of basal cell carcinoma of right upper arm, biopsy of skin of lower extremity (left), scheduled for 04/23/2023 with Dr. Ladona Ridgel.  Patient reports she is tolerated anesthesia in the past, she does report that she is a shallow breather and reports that she would like anesthesia to know this.  She was encouraged to notify them day of surgery and it is also documented in her EMR.  Patient reports that she is a DNR, her she reports that this is on file with the hospital.  No history of DVT/PE.  No family history of DVT/PE.  No family or personal history of bleeding or clotting disorders.  Patient is not currently taking any blood thinners.  No history of CVA/MI.   Summary of Previous Visit: Patient referred by dermatologist for 3 cm lesion on her right bicep, pathology report showed basal cell carcinoma.  Lesion has been there for many years, probably more than 10.  Patient has multiple other medical comorbidities including autoimmune diseases, multiple food and drug allergies.  She also has an aortic arch deformities.  PMH Significant for: Asthma, COPD, OSA on CPAP, history of thoracic aortic aneurysm, hypercholesterolemia, aortic arch anomaly.  History of fibromyalgia, polymyalgia rheumatica, CKD, PVD  Patient has been seen by cardiology within the past few months.  Patient received MyChart message from cardiology CMA stating "after speaking with Dr. Jenene Slicker regarding her upcoming procedures, she stated that you can proceed with these."  There  is also a note in patient's chart that says patient is a low risk for low risk procedure.  She can proceed without further cardiac testing.  Patient reports that she has stopped all of her supplements as of September 2. She reports that she does have a history of asthma/COPD, reports that she uses an albuterol inhaler mostly during the allergy season.  She has not needed a rescue inhaler in quite some time.  She reports no recent exacerbations.  She does report that she was recently diagnosed with Sjogren's syndrome.  She reports that she is not on any autoimmune medications at this time.  She reports she has allergies to most of them.  She reports that she was able to read through the consent form today, she jotted down some questions.  She verbally states that she received cardiac clearance from her cardiologist.  Patient reports she has a history of OSA, she reports that she sold her CPAP machine about 2 years ago because she now sleeps on her stomach and it does not work when she sleeps on her stomach.  She feels as if she sleeps fine and does not have any issues or OSA symptoms at this time.  She does report that she is "a very shallow breather" and that anesthesia has had to give her very light sedation in the past.   Past Medical History: Allergies: Allergies  Allergen Reactions   Advair Diskus [Fluticasone-Salmeterol] Other (See Comments)    Blood in urine  Ciprofloxacin Anaphylaxis   Escitalopram Anaphylaxis   Jasmine Fragrance Anaphylaxis   Breo Ellipta [Fluticasone Furoate-Vilanterol] Other (See Comments)    Swelling of the abdomin.    Diclofenac Other (See Comments)    unknown   Furosemide Swelling   Molds & Smuts Other (See Comments)    unknown   Peanuts [Peanut Oil]    Potassium-Containing Compounds Swelling   Pravastatin Other (See Comments)    unknown   Tilactase Other (See Comments)    unknown   Latex Rash   Pork-Derived Products Rash   Sulfa Antibiotics Rash     Current Medications:  Current Outpatient Medications:    albuterol (PROVENTIL) (2.5 MG/3ML) 0.083% nebulizer solution, Take 3 mLs (2.5 mg total) by nebulization every 6 (six) hours as needed for wheezing., Disp: 75 mL, Rfl: prn   albuterol (VENTOLIN HFA) 108 (90 Base) MCG/ACT inhaler, Inhale 2 puffs into the lungs every 4 (four) hours as needed for wheezing., Disp: 1 each, Rfl: prn   Ascorbic Acid (VITAMIN C) 500 MG CAPS, Take 500 mg by mouth daily., Disp: , Rfl:    cholecalciferol (VITAMIN D3) 25 MCG (1000 UNIT) tablet, Take 1,000 Units by mouth daily., Disp: , Rfl:    diphenhydrAMINE (BENADRYL) 50 MG tablet, Take 50 mg by mouth at bedtime., Disp: , Rfl:    EPINEPHrine 0.3 mg/0.3 mL IJ SOAJ injection, Inject 0.3 mg into the muscle as needed for anaphylaxis., Disp: , Rfl:    HYDROcodone-acetaminophen (NORCO) 5-325 MG tablet, Take 1 tablet by mouth every 6 (six) hours as needed for up to 3 days for moderate pain., Disp: 12 tablet, Rfl: 0   HYDROcodone-acetaminophen (NORCO/VICODIN) 5-325 MG tablet, Take 1 tablet by mouth daily as needed for severe pain., Disp: , Rfl:    loratadine (CLARITIN) 10 MG tablet, Take 10 mg by mouth daily., Disp: , Rfl:    LUMIGAN 0.01 % SOLN, Place 1 drop into both eyes at bedtime., Disp: , Rfl:    Melatonin 10 MG TABS, Take 10 mg by mouth at bedtime., Disp: , Rfl:    naproxen sodium (ALEVE) 220 MG tablet, Take 220 mg by mouth daily as needed (pain)., Disp: , Rfl:    ondansetron (ZOFRAN) 4 MG tablet, Take 1 tablet (4 mg total) by mouth every 8 (eight) hours as needed for nausea or vomiting., Disp: 20 tablet, Rfl: 0   RESTASIS 0.05 % ophthalmic emulsion, Place 1 drop into both eyes 2 (two) times daily as needed (dry eyes)., Disp: , Rfl:    TIROSINT-SOL 100 MCG/ML SOLN, Take 90 mcg by mouth daily., Disp: , Rfl:    vitamin B-12 (CYANOCOBALAMIN) 500 MCG tablet, Take 500 mcg by mouth daily., Disp: , Rfl:   Past Medical Problems: Past Medical History:  Diagnosis Date    Angio-edema    Arm fracture    right arm    Asthma    Bulging lumbar disc    Complication of anesthesia    pt sts, "I am a very shallow breather and they have to do something special for me"/   COPD (chronic obstructive pulmonary disease) (HCC)    Degenerative joint disease (DJD) of hip    DJD (degenerative joint disease) of hip    Eczema    Elevated LFTs    Hip pain    Hypothyroidism    Insomnia    Nerve damage    right arm.   PTSD (post-traumatic stress disorder)    Sleep apnea    has CPAP  Thyroid disease    Urticaria     Past Surgical History: Past Surgical History:  Procedure Laterality Date   COLONOSCOPY N/A 08/12/2014   Procedure: COLONOSCOPY;  Surgeon: Corbin Ade, MD;  Location: [facility];  Service: Endoscopy;  Laterality: N/A;  115   thumb surgery     TOOTH EXTRACTION  04/2022   UMBILICAL HERNIA REPAIR N/A 12/30/2014   Procedure: HERNIA REPAIR UMBILICAL ADULT WITH MESH;  Surgeon: Franky Macho Md, MD;  Location: [facility];  Service: General;  Laterality: N/A;    Social History: Social History   Socioeconomic History   Marital status: Single    Spouse name: Not on file   Number of children: Not on file   Years of education: Not on file   Highest education level: Not on file  Occupational History   Not on file  Tobacco Use   Smoking status: Never    Passive exposure: Yes   Smokeless tobacco: Never  Vaping Use   Vaping status: Never Used  Substance and Sexual Activity   Alcohol use: Yes    Comment: 4 oz wine    Drug use: No   Sexual activity: Never    Birth control/protection: None  Other Topics Concern   Not on file  Social History Narrative   Not on file   Social Determinants of Health   Financial Resource Strain: Not on file  Food Insecurity: Not on file  Transportation Needs: Not on file  Physical Activity: Insufficiently Active (01/22/2023)   Received from [facility], [facility]   Exercise Vital Sign    Days of Exercise  per Week: 7 days    Minutes of Exercise per Session: 20 min  Stress: Not on file  Social Connections: Not on file  Intimate Partner Violence: Not At Risk (01/22/2023)   Received from [place], [facility]   Humiliation, Afraid, Rape, and Kick questionnaire    Fear of Current or Ex-Partner: No    Emotionally Abused: No    Physically Abused: No    Sexually Abused: No    Family History: Family History  Problem Relation Age of Onset   COPD Mother    Cancer - Lung Mother    Cancer Father    Hypertension Father    Drug abuse Sister    Dementia Maternal Aunt    Alcohol abuse Paternal Aunt    Alcohol abuse Paternal Uncle    Heart disease Maternal Grandmother    Heart disease Maternal Grandfather    Heart disease Paternal Grandmother    Heart disease Paternal Grandfather    Heart attack Paternal Grandfather    Physical abuse Other    Anxiety disorder Other    Birth defects Other        Downs Synddrome   ADD / ADHD Neg Hx    Bipolar disorder Neg Hx    OCD Neg Hx    Paranoid behavior Neg Hx    Schizophrenia Neg Hx    Seizures Neg Hx    Sexual abuse Neg Hx    Allergic rhinitis Neg Hx    Asthma Neg Hx    Eczema Neg Hx    Urticaria Neg Hx    Breast cancer Neg Hx     Review of Systems: Review of Systems  Constitutional: Negative.   Respiratory: Negative.    Cardiovascular: Negative.   Gastrointestinal: Negative.   Neurological: Negative.     Physical Exam: Vital Signs BP Marland Kitchen)  148/85 (BP Location: Left Arm, Patient Position: Sitting, Cuff Size: Large)   Pulse 63   Ht 5\' 5"  (1.651 m)   Wt 256 lb 9.6 oz (116.4 kg)   SpO2 99%   BMI 42.70 kg/m   Physical Exam  Constitutional:      General: Not in acute distress.    Appearance: Normal appearance. Not ill-appearing.  HENT:     Head: Normocephalic and atraumatic.  Eyes:     Pupils: Pupils are equal, round Neck:     Musculoskeletal: Normal range of motion.  Cardiovascular:     Rate and Rhythm: Normal  rate    Pulses: Normal pulses.  Pulmonary:     Effort: Pulmonary effort is normal. No respiratory distress.  Abdominal:     General: Abdomen is flat. There is no distension.  Musculoskeletal: Normal range of motion.  Skin:    General: Skin is warm and dry.     Findings: No erythema or rash.  Neurological:     General: No focal deficit present.     Mental Status: Alert and oriented to person, place, and time. Mental status is at baseline.     Motor: No weakness.  Psychiatric:        Mood and Affect: Mood normal.        Behavior: Behavior normal.    Assessment/Plan: The patient is scheduled for excision of basal cell carcinoma of right upper arm and biopsy of left leg/foot skin with Dr. Ladona Ridgel.  Risks, benefits, and alternatives of procedure discussed, questions answered and consent obtained.    Smoking Status: Non-smoker; Counseling Given?  N/A  Caprini Score: 7 high; Risk Factors include: Age, BMI greater than 40, swelling in her legs and length of planned surgery. Recommendation for mechanical and possible pharmacological prophylaxis. Encourage early ambulation.  Will discuss with Dr. Ladona Ridgel.  Pictures obtained: Pictures were obtained of the patient and placed in the chart with the patient's or guardian's permission.  Post-op Rx sent to pharmacy:  Hydrocodone, Zofran  Patient was provided with the General Surgical Risk consent document and Pain Medication Agreement prior to their appointment.  They had adequate time to read through the risk consent documents and Pain Medication Agreement. We also discussed them in person together during this preop appointment. All of their questions were answered to their satisfaction.  Recommended calling if they have any further questions.  Risk consent form and Pain Medication Agreement to be scanned into patient's chart.  The risks that can be encountered with and after excision of a skin lesion were discussed and include the following but not  limited to these: bleeding, infection, delayed healing, anesthesia risks, skin sensation changes, injury to structures including nerves, blood vessels, and muscles which may be temporary or permanent, allergies to tape, suture materials and glues, blood products, topical preparations or injected agents, skin contour irregularities, skin discoloration and swelling, deep vein thrombosis, cardiac and pulmonary complications, pain, which may persist, persistent pain, recurrence of the lesion, poor healing of the incision, possible need for revisional surgery or staged procedures.  Additional surgery may be required if margins are positive.  Recommend patient follow-up with dermatology in regards to her questions about "full body check"  Cardiac clearance previously sent out, patient has received cardiac clearance from her cardiologist.  This is documented in the EMR.  Electronically signed by: Kermit Balo Atha Muradyan, PA-C 04/12/2023 10:38 AM

## 2023-04-12 NOTE — H&P (View-Only) (Signed)
Patient ID: Catherine Turner, female    DOB: October 19, 1955, 67 y.o.   MRN: [id number]  Chief Complaint  Patient presents with   Pre-op Exam      ICD-10-CM   1. Basal cell carcinoma (BCC) of skin of right upper extremity including shoulder  C44.612       History of Present Illness: Catherine Turner is a 67 y.o.  female  with a history of basal cell carcinoma of right upper extremity..  She presents for preoperative evaluation for upcoming procedure, excision of basal cell carcinoma of right upper arm, biopsy of skin of lower extremity (left), scheduled for 04/23/2023 with Dr. Ladona Ridgel.  Patient reports she is tolerated anesthesia in the past, she does report that she is a shallow breather and reports that she would like anesthesia to know this.  She was encouraged to notify them day of surgery and it is also documented in her EMR.  Patient reports that she is a DNR, her she reports that this is on file with the hospital.  No history of DVT/PE.  No family history of DVT/PE.  No family or personal history of bleeding or clotting disorders.  Patient is not currently taking any blood thinners.  No history of CVA/MI.   Summary of Previous Visit: Patient referred by dermatologist for 3 cm lesion on her right bicep, pathology report showed basal cell carcinoma.  Lesion has been there for many years, probably more than 10.  Patient has multiple other medical comorbidities including autoimmune diseases, multiple food and drug allergies.  She also has an aortic arch deformities.  PMH Significant for: Asthma, COPD, OSA on CPAP, history of thoracic aortic aneurysm, hypercholesterolemia, aortic arch anomaly.  History of fibromyalgia, polymyalgia rheumatica, CKD, PVD  Patient has been seen by cardiology within the past few months.  Patient received MyChart message from cardiology CMA stating "after speaking with Dr. Jenene Slicker regarding her upcoming procedures, she stated that you can proceed with these."  There  is also a note in patient's chart that says patient is a low risk for low risk procedure.  She can proceed without further cardiac testing.  Patient reports that she has stopped all of her supplements as of September 2. She reports that she does have a history of asthma/COPD, reports that she uses an albuterol inhaler mostly during the allergy season.  She has not needed a rescue inhaler in quite some time.  She reports no recent exacerbations.  She does report that she was recently diagnosed with Sjogren's syndrome.  She reports that she is not on any autoimmune medications at this time.  She reports she has allergies to most of them.  She reports that she was able to read through the consent form today, she jotted down some questions.  She verbally states that she received cardiac clearance from her cardiologist.  Patient reports she has a history of OSA, she reports that she sold her CPAP machine about 2 years ago because she now sleeps on her stomach and it does not work when she sleeps on her stomach.  She feels as if she sleeps fine and does not have any issues or OSA symptoms at this time.  She does report that she is "a very shallow breather" and that anesthesia has had to give her very light sedation in the past.   Past Medical History: Allergies: Allergies  Allergen Reactions   Advair Diskus [Fluticasone-Salmeterol] Other (See Comments)    Blood in urine  Ciprofloxacin Anaphylaxis   Escitalopram Anaphylaxis   Jasmine Fragrance Anaphylaxis   Breo Ellipta [Fluticasone Furoate-Vilanterol] Other (See Comments)    Swelling of the abdomin.    Diclofenac Other (See Comments)    unknown   Furosemide Swelling   Molds & Smuts Other (See Comments)    unknown   Peanuts [Peanut Oil]    Potassium-Containing Compounds Swelling   Pravastatin Other (See Comments)    unknown   Tilactase Other (See Comments)    unknown   Latex Rash   Pork-Derived Products Rash   Sulfa Antibiotics Rash     Current Medications:  Current Outpatient Medications:    albuterol (PROVENTIL) (2.5 MG/3ML) 0.083% nebulizer solution, Take 3 mLs (2.5 mg total) by nebulization every 6 (six) hours as needed for wheezing., Disp: 75 mL, Rfl: prn   albuterol (VENTOLIN HFA) 108 (90 Base) MCG/ACT inhaler, Inhale 2 puffs into the lungs every 4 (four) hours as needed for wheezing., Disp: 1 each, Rfl: prn   Ascorbic Acid (VITAMIN C) 500 MG CAPS, Take 500 mg by mouth daily., Disp: , Rfl:    cholecalciferol (VITAMIN D3) 25 MCG (1000 UNIT) tablet, Take 1,000 Units by mouth daily., Disp: , Rfl:    diphenhydrAMINE (BENADRYL) 50 MG tablet, Take 50 mg by mouth at bedtime., Disp: , Rfl:    EPINEPHrine 0.3 mg/0.3 mL IJ SOAJ injection, Inject 0.3 mg into the muscle as needed for anaphylaxis., Disp: , Rfl:    HYDROcodone-acetaminophen (NORCO) 5-325 MG tablet, Take 1 tablet by mouth every 6 (six) hours as needed for up to 3 days for moderate pain., Disp: 12 tablet, Rfl: 0   HYDROcodone-acetaminophen (NORCO/VICODIN) 5-325 MG tablet, Take 1 tablet by mouth daily as needed for severe pain., Disp: , Rfl:    loratadine (CLARITIN) 10 MG tablet, Take 10 mg by mouth daily., Disp: , Rfl:    LUMIGAN 0.01 % SOLN, Place 1 drop into both eyes at bedtime., Disp: , Rfl:    Melatonin 10 MG TABS, Take 10 mg by mouth at bedtime., Disp: , Rfl:    naproxen sodium (ALEVE) 220 MG tablet, Take 220 mg by mouth daily as needed (pain)., Disp: , Rfl:    ondansetron (ZOFRAN) 4 MG tablet, Take 1 tablet (4 mg total) by mouth every 8 (eight) hours as needed for nausea or vomiting., Disp: 20 tablet, Rfl: 0   RESTASIS 0.05 % ophthalmic emulsion, Place 1 drop into both eyes 2 (two) times daily as needed (dry eyes)., Disp: , Rfl:    TIROSINT-SOL 100 MCG/ML SOLN, Take 90 mcg by mouth daily., Disp: , Rfl:    vitamin B-12 (CYANOCOBALAMIN) 500 MCG tablet, Take 500 mcg by mouth daily., Disp: , Rfl:   Past Medical Problems: Past Medical History:  Diagnosis Date    Angio-edema    Arm fracture    right arm    Asthma    Bulging lumbar disc    Complication of anesthesia    pt sts, "I am a very shallow breather and they have to do something special for me"/   COPD (chronic obstructive pulmonary disease) (HCC)    Degenerative joint disease (DJD) of hip    DJD (degenerative joint disease) of hip    Eczema    Elevated LFTs    Hip pain    Hypothyroidism    Insomnia    Nerve damage    right arm.   PTSD (post-traumatic stress disorder)    Sleep apnea    has CPAP  Thyroid disease    Urticaria     Past Surgical History: Past Surgical History:  Procedure Laterality Date   COLONOSCOPY N/A 08/12/2014   Procedure: COLONOSCOPY;  Surgeon: Corbin Ade, MD;  Location: [facility];  Service: Endoscopy;  Laterality: N/A;  115   thumb surgery     TOOTH EXTRACTION  04/2022   UMBILICAL HERNIA REPAIR N/A 12/30/2014   Procedure: HERNIA REPAIR UMBILICAL ADULT WITH MESH;  Surgeon: Franky Macho Md, MD;  Location: [facility] ORS;  Service: General;  Laterality: N/A;    Social History: Social History   Socioeconomic History   Marital status: Single    Spouse name: Not on file   Number of children: Not on file   Years of education: Not on file   Highest education level: Not on file  Occupational History   Not on file  Tobacco Use   Smoking status: Never    Passive exposure: Yes   Smokeless tobacco: Never  Vaping Use   Vaping status: Never Used  Substance and Sexual Activity   Alcohol use: Yes    Comment: 4 oz wine    Drug use: No   Sexual activity: Never    Birth control/protection: None  Other Topics Concern   Not on file  Social History Narrative   Not on file   Social Determinants of Health   Financial Resource Strain: Not on file  Food Insecurity: Not on file  Transportation Needs: Not on file  Physical Activity: Insufficiently Active (01/22/2023)   Received from [facility], [facility]   Exercise Vital Sign    Days of Exercise  per Week: 7 days    Minutes of Exercise per Session: 20 min  Stress: Not on file  Social Connections: Not on file  Intimate Partner Violence: Not At Risk (01/22/2023)   Received from [facility], [facility]   Humiliation, Afraid, Rape, and Kick questionnaire    Fear of Current or Ex-Partner: No    Emotionally Abused: No    Physically Abused: No    Sexually Abused: No    Family History: Family History  Problem Relation Age of Onset   COPD Mother    Cancer - Lung Mother    Cancer Father    Hypertension Father    Drug abuse Sister    Dementia Maternal Aunt    Alcohol abuse Paternal Aunt    Alcohol abuse Paternal Uncle    Heart disease Maternal Grandmother    Heart disease Maternal Grandfather    Heart disease Paternal Grandmother    Heart disease Paternal Grandfather    Heart attack Paternal Grandfather    Physical abuse Other    Anxiety disorder Other    Birth defects Other        Downs Synddrome   ADD / ADHD Neg Hx    Bipolar disorder Neg Hx    OCD Neg Hx    Paranoid behavior Neg Hx    Schizophrenia Neg Hx    Seizures Neg Hx    Sexual abuse Neg Hx    Allergic rhinitis Neg Hx    Asthma Neg Hx    Eczema Neg Hx    Urticaria Neg Hx    Breast cancer Neg Hx     Review of Systems: Review of Systems  Constitutional: Negative.   Respiratory: Negative.    Cardiovascular: Negative.   Gastrointestinal: Negative.   Neurological: Negative.     Physical Exam: Vital Signs BP Marland Kitchen)  148/85 (BP Location: Left Arm, Patient Position: Sitting, Cuff Size: Large)   Pulse 63   Ht 5\' 5"  (1.651 m)   Wt 256 lb 9.6 oz (116.4 kg)   SpO2 99%   BMI 42.70 kg/m   Physical Exam  Constitutional:      General: Not in acute distress.    Appearance: Normal appearance. Not ill-appearing.  HENT:     Head: Normocephalic and atraumatic.  Eyes:     Pupils: Pupils are equal, round Neck:     Musculoskeletal: Normal range of motion.  Cardiovascular:     Rate and Rhythm: Normal  rate    Pulses: Normal pulses.  Pulmonary:     Effort: Pulmonary effort is normal. No respiratory distress.  Abdominal:     General: Abdomen is flat. There is no distension.  Musculoskeletal: Normal range of motion.  Skin:    General: Skin is warm and dry.     Findings: No erythema or rash.  Neurological:     General: No focal deficit present.     Mental Status: Alert and oriented to person, place, and time. Mental status is at baseline.     Motor: No weakness.  Psychiatric:        Mood and Affect: Mood normal.        Behavior: Behavior normal.    Assessment/Plan: The patient is scheduled for excision of basal cell carcinoma of right upper arm and biopsy of left leg/foot skin with Dr. Ladona Ridgel.  Risks, benefits, and alternatives of procedure discussed, questions answered and consent obtained.    Smoking Status: Non-smoker; Counseling Given?  N/A  Caprini Score: 7 high; Risk Factors include: Age, BMI greater than 40, swelling in her legs and length of planned surgery. Recommendation for mechanical and possible pharmacological prophylaxis. Encourage early ambulation.  Will discuss with Dr. Ladona Ridgel.  Pictures obtained: Pictures were obtained of the patient and placed in the chart with the patient's or guardian's permission.  Post-op Rx sent to pharmacy:  Hydrocodone, Zofran  Patient was provided with the General Surgical Risk consent document and Pain Medication Agreement prior to their appointment.  They had adequate time to read through the risk consent documents and Pain Medication Agreement. We also discussed them in person together during this preop appointment. All of their questions were answered to their satisfaction.  Recommended calling if they have any further questions.  Risk consent form and Pain Medication Agreement to be scanned into patient's chart.  The risks that can be encountered with and after excision of a skin lesion were discussed and include the following but not  limited to these: bleeding, infection, delayed healing, anesthesia risks, skin sensation changes, injury to structures including nerves, blood vessels, and muscles which may be temporary or permanent, allergies to tape, suture materials and glues, blood products, topical preparations or injected agents, skin contour irregularities, skin discoloration and swelling, deep vein thrombosis, cardiac and pulmonary complications, pain, which may persist, persistent pain, recurrence of the lesion, poor healing of the incision, possible need for revisional surgery or staged procedures.  Additional surgery may be required if margins are positive.  Recommend patient follow-up with dermatology in regards to her questions about "full body check"  Cardiac clearance previously sent out, patient has received cardiac clearance from her cardiologist.  This is documented in the EMR.  Electronically signed by: Kermit Balo Mckaylin Bastien, PA-C 04/12/2023 10:38 AM

## 2023-04-13 ENCOUNTER — Telehealth: Payer: Self-pay

## 2023-04-13 NOTE — Telephone Encounter (Signed)
Pt left voicemail stating that she will not be taking the Zofran that was prescribed to her because she is allergic to it.

## 2023-04-16 NOTE — Pre-Procedure Instructions (Signed)
Surgical Instructions   Your procedure is scheduled on April 23, 2023. Report to [facility] Main Entrance "A" at 9:30 A.M., then check in with the Admitting office. Any questions or running late day of surgery: call [phone]  Questions prior to your surgery date: call [phone], Monday-Friday, 8am-4pm. If you experience any cold or flu symptoms such as cough, fever, chills, shortness of breath, etc. between now and your scheduled surgery, please notify us at the above number.     Remember:  Do not eat after midnight the night before your surgery   You may drink clear liquids until 8:30 AM the morning of your surgery.   Clear liquids allowed are: Water, Non-Citrus Juices (without pulp), Carbonated Beverages, Clear Tea, Black Coffee Only (NO MILK, CREAM OR POWDERED CREAMER of any kind), and Gatorade.    Take these medicines the morning of surgery with A SIP OF WATER: loratadine (CLARITIN)  TIROSINT-SO    May take these medicines IF NEEDED: albuterol (PROVENTIL) nebulizer solution  albuterol (VENTOLIN HFA) inhaler - please bring morning of surgery EPINEPHrine Pen HYDROcodone-acetaminophen (NORCO/VICODIN)  ondansetron (ZOFRAN)  RESTASIS ophthalmic emulsion    One week prior to surgery, STOP taking any Aspirin (unless otherwise instructed by your surgeon) Aleve, Naproxen, Ibuprofen, Motrin, Advil, Goody's, BC's, all herbal medications, fish oil, and non-prescription vitamins.                     Do NOT Smoke (Tobacco/Vaping) for 24 hours prior to your procedure.  If you use a CPAP at night, you may bring your mask/headgear for your overnight stay.   You will be asked to remove any contacts, glasses, piercing's, hearing aid's, dentures/partials prior to surgery. Please bring cases for these items if needed.    Patients discharged the day of surgery will not be allowed to drive home, and someone needs to stay with them for 24 hours.  SURGICAL WAITING ROOM  VISITATION Patients may have no more than 2 support people in the waiting area - these visitors may rotate.   Pre-op nurse will coordinate an appropriate time for 1 ADULT support person, who may not rotate, to accompany patient in pre-op.  Children under the age of 88 must have an adult with them who is not the patient and must remain in the main waiting area with an adult.  If the patient needs to stay at the hospital during part of their recovery, the visitor guidelines for inpatient rooms apply.  Please refer to the [facility] website for the visitor guidelines for any additional information.   If you received a COVID test during your pre-op visit  it is requested that you wear a mask when out in public, stay away from anyone that may not be feeling well and notify your surgeon if you develop symptoms. If you have been in contact with anyone that has tested positive in the last 10 days please notify you surgeon.      Pre-operative CHG Bathing Instructions   You can play a key role in reducing the risk of infection after surgery. Your skin needs to be as free of germs as possible. You can reduce the number of germs on your skin by washing with CHG (chlorhexidine gluconate) soap before surgery. CHG is an antiseptic soap that kills germs and continues to kill germs even after washing.   DO NOT use if you have an allergy to chlorhexidine/CHG or antibacterial soaps. If your skin becomes reddened or irritated, stop using  the CHG and notify one of our RNs at [phone].              TAKE A SHOWER THE NIGHT BEFORE SURGERY AND THE DAY OF SURGERY    Please keep in mind the following:  DO NOT shave, including legs and underarms, 48 hours prior to surgery.   You may shave your face before/day of surgery.  Place clean sheets on your bed the night before surgery Use a clean washcloth (not used since being washed) for each shower. DO NOT sleep with pet's night before surgery.  CHG Shower  Instructions:  If you choose to wash your hair and private area, wash first with your normal shampoo/soap.  After you use shampoo/soap, rinse your hair and body thoroughly to remove shampoo/soap residue.  Turn the water OFF and apply half the bottle of CHG soap to a CLEAN washcloth.  Apply CHG soap ONLY FROM YOUR NECK DOWN TO YOUR TOES (washing for 3-5 minutes)  DO NOT use CHG soap on face, private areas, open wounds, or sores.  Pay special attention to the area where your surgery is being performed.  If you are having back surgery, having someone wash your back for you may be helpful. Wait 2 minutes after CHG soap is applied, then you may rinse off the CHG soap.  Pat dry with a clean towel  Put on clean pajamas    Additional instructions for the day of surgery: DO NOT APPLY any lotions, deodorants, cologne, or perfumes.   Do not wear jewelry or makeup Do not wear nail polish, gel polish, artificial nails, or any other type of covering on natural nails (fingers and toes) Do not bring valuables to the hospital. [facility] is not responsible for valuables/personal belongings. Put on clean/comfortable clothes.  Please brush your teeth.  Ask your nurse before applying any prescription medications to the skin.

## 2023-04-17 ENCOUNTER — Encounter (HOSPITAL_COMMUNITY)
Admission: RE | Admit: 2023-04-17 | Discharge: 2023-04-17 | Disposition: A | Discharge status: Home / Self Care | Payer: 59 | Source: Ambulatory Visit | Attending: Plastic Surgery | Admitting: Plastic Surgery

## 2023-04-17 ENCOUNTER — Encounter (HOSPITAL_COMMUNITY): Payer: Self-pay

## 2023-04-17 ENCOUNTER — Other Ambulatory Visit: Payer: Self-pay

## 2023-04-17 VITALS — BP 142/84 | HR 65 | Temp 98.0°F | Resp 18 | Ht 64.0 in | Wt 259.9 lb

## 2023-04-17 DIAGNOSIS — Z6841 Body Mass Index (BMI) 40.0 and over, adult: Secondary | ICD-10-CM | POA: Diagnosis not present

## 2023-04-17 DIAGNOSIS — M3500 Sicca syndrome, unspecified: Secondary | ICD-10-CM | POA: Diagnosis not present

## 2023-04-17 DIAGNOSIS — Z01812 Encounter for preprocedural laboratory examination: Secondary | ICD-10-CM | POA: Diagnosis present

## 2023-04-17 DIAGNOSIS — Z01818 Encounter for other preprocedural examination: Secondary | ICD-10-CM

## 2023-04-17 HISTORY — DX: Sjogren syndrome, unspecified: M35.00

## 2023-04-17 HISTORY — DX: Malignant (primary) neoplasm, unspecified: C80.1

## 2023-04-17 LAB — COMPREHENSIVE METABOLIC PANEL
ALT: 18 U/L (ref 0–44)
AST: 24 U/L (ref 15–41)
Albumin: 3.9 g/dL (ref 3.5–5.0)
Alkaline Phosphatase: 52 U/L (ref 38–126)
Anion gap: 7 (ref 5–15)
BUN: 12 mg/dL (ref 8–23)
CO2: 27 mmol/L (ref 22–32)
Calcium: 9.4 mg/dL (ref 8.9–10.3)
Chloride: 105 mmol/L (ref 98–111)
Creatinine, Ser: 0.92 mg/dL (ref 0.44–1.00)
GFR, Estimated: >60 mL/min (ref >60)
Glucose, Bld: 118 mg/dL — ABNORMAL HIGH (ref 70–99)
Potassium: 3.6 mmol/L (ref 3.5–5.1)
Sodium: 139 mmol/L (ref 135–145)
Total Bilirubin: 0.7 mg/dL (ref 0.3–1.2)
Total Protein: 7.2 g/dL (ref 6.5–8.1)

## 2023-04-17 LAB — CBC
HCT: 45.5 % (ref 36.0–46.0)
Hemoglobin: 14.7 g/dL (ref 12.0–15.0)
MCH: 28.8 pg (ref 26.0–34.0)
MCHC: 32.3 g/dL (ref 30.0–36.0)
MCV: 89.2 fL (ref 80.0–100.0)
Platelets: 171 10*3/uL (ref 150–400)
RBC: 5.1 MIL/uL (ref 3.87–5.11)
RDW: 14.2 % (ref 11.5–15.5)
WBC: 6.9 10*3/uL (ref 4.0–10.5)
nRBC: 0 % (ref 0.0–0.2)

## 2023-04-17 NOTE — Progress Notes (Signed)
PCP - Dr. Doreen Beam Cardiologist - Dr. Luane School  PPM/ICD - n/a  Chest x-ray - n/a EKG - 03/06/23 Stress Test - 08/30/21 ECHO - denies Cardiac Cath - denies  Sleep Study - OSA+ CPAP - denies use  Blood Thinner Instructions: n/a Aspirin Instructions: n/a  ERAS Protcol - Clear liquids until 0830 DOS PRE-SURGERY Ensure or G2- none ordered.  COVID TEST- n/a  Anesthesia review: Yes, cardiac clearance received.    Patient denies shortness of breath, fever, cough and chest pain at PAT appointment   All instructions explained to the patient, with a verbal understanding of the material. Patient agrees to go over the instructions while at home for a better understanding. Patient also instructed to self quarantine after being tested for COVID-19. The opportunity to ask questions was provided.

## 2023-04-18 NOTE — Progress Notes (Signed)
Anesthesia Chart Review:  Patient is a 67 year old female with pertinent history including OSA not on CPAP, Sjogren's syndrome, COPD, asthma, hypothyroid, morbid obesity BMI 45.  Recently evaluated by cardiology for history of bovine aortic arch per CT imaging in 2015.  Seen by Dr. Jenene Slicker on 03/06/2023 for evaluation.  CTA was ordered for further evaluation.  CTA 03/13/2023 showed no thoracic aortic aneurysm, bovine arch configuration.  Dr. Jenene Slicker subsequently cleared the patient as low risk for surgery.  History of low risk nuclear stress test done 08/29/2021 at [facility] .  Patient reports she has previously been told she has a "shallow breather".  Consider obesity hypoventilation syndrome.  Preop labs reviewed, unremarkable.  EKG 03/06/2023: Sinus rhythm with premature atrial complexes.  Rate 76.  Nuclear stress test 08/29/2021 (Care Everywhere): 1. No reversible ischemia or infarction. Motion degradation of the  stress imaging.   2. Normal left ventricular wall motion.   3. Left ventricular ejection fraction 64%   4. Non invasive risk stratification*: Low    [facility]/Anesthesiology Phone [phone] 04/18/2023 11:05 AM

## 2023-04-18 NOTE — Anesthesia Preprocedure Evaluation (Addendum)
Anesthesia Evaluation  Patient identified by MRN, date of birth, ID band Patient awake    Reviewed: Allergy & Precautions, NPO status , Patient's Chart, lab work & pertinent test results  History of Anesthesia Complications Negative for: history of anesthetic complications  Airway Mallampati: III  TM Distance: >3 FB Neck ROM: Full    Dental  (+) Poor Dentition, Dental Advisory Given   Pulmonary shortness of breath, asthma , sleep apnea , COPD   breath sounds clear to auscultation       Cardiovascular negative cardio ROS  Rhythm:Regular     Neuro/Psych  PSYCHIATRIC DISORDERS Anxiety Depression    negative neurological ROS     GI/Hepatic negative GI ROS, Neg liver ROS,,,  Endo/Other    Morbid obesity  Renal/GU negative Renal ROS     Musculoskeletal  (+) Arthritis ,    Abdominal   Peds  Hematology negative hematology ROS (+) Lab Results      Component                Value               Date                      WBC                      6.9                 04/17/2023                HGB                      14.7                04/17/2023                HCT                      45.5                04/17/2023                MCV                      89.2                04/17/2023                PLT                      171                 04/17/2023              Anesthesia Other Findings   Reproductive/Obstetrics                             Anesthesia Physical Anesthesia Plan  ASA: 3  Anesthesia Plan: General   Post-op Pain Management:    Induction: Intravenous  PONV Risk Score and Plan: 3 and Ondansetron and Dexamethasone  Airway Management Planned: Oral ETT  Additional Equipment: None  Intra-op Plan:   Post-operative Plan:   Informed Consent: I have reviewed the patients History and Physical, chart, labs and discussed the procedure including the risks, benefits and  alternatives for the proposed anesthesia  with the patient or authorized representative who has indicated his/her understanding and acceptance.     Dental advisory given  Plan Discussed with: CRNA  Anesthesia Plan Comments: (PAT note by Antionette Poles, PA-C: Patient is a 67 year old female with pertinent history including OSA not on CPAP, Sjogren's syndrome, COPD, asthma, hypothyroid, morbid obesity BMI 45.  Recently evaluated by cardiology for history of bovine aortic arch per CT imaging in 2015.  Seen by Dr. Jenene Slicker on 03/06/2023 for evaluation.  CTA was ordered for further evaluation.  CTA 03/13/2023 showed no thoracic aortic aneurysm, bovine arch configuration.  Dr. Jenene Slicker subsequently cleared the patient as low risk for surgery.  History of low risk nuclear stress test done 08/29/2021 at [facility] .  Patient reports she has previously been told she has a "shallow breather".  Consider obesity hypoventilation syndrome.  Preop labs reviewed, unremarkable.  EKG 03/06/2023: Sinus rhythm with premature atrial complexes.  Rate 76.  Nuclear stress test 08/29/2021 (Care Everywhere): 1. No reversible ischemia or infarction. Motion degradation of the  stress imaging.   2. Normal left ventricular wall motion.   3. Left ventricular ejection fraction 64%   4. Non invasive risk stratification*: Low    )        Anesthesia Quick Evaluation

## 2023-04-23 ENCOUNTER — Other Ambulatory Visit: Payer: Self-pay

## 2023-04-23 ENCOUNTER — Encounter (HOSPITAL_COMMUNITY): Payer: Self-pay | Admitting: Plastic Surgery

## 2023-04-23 ENCOUNTER — Ambulatory Visit (HOSPITAL_BASED_OUTPATIENT_CLINIC_OR_DEPARTMENT_OTHER): Payer: 59

## 2023-04-23 ENCOUNTER — Ambulatory Visit (HOSPITAL_COMMUNITY): Payer: 59 | Admitting: Physician Assistant

## 2023-04-23 ENCOUNTER — Ambulatory Visit (HOSPITAL_COMMUNITY)
Admission: RE | Admit: 2023-04-23 | Discharge: 2023-04-23 | Disposition: A | Discharge status: Home / Self Care | Payer: 59 | Attending: Plastic Surgery | Admitting: Plastic Surgery

## 2023-04-23 ENCOUNTER — Encounter (HOSPITAL_COMMUNITY)
Admission: RE | Disposition: A | Discharge status: Home / Self Care | Payer: Self-pay | Source: Home / Self Care | Attending: Plastic Surgery

## 2023-04-23 DIAGNOSIS — Z66 Do not resuscitate: Secondary | ICD-10-CM | POA: Insufficient documentation

## 2023-04-23 DIAGNOSIS — G4733 Obstructive sleep apnea (adult) (pediatric): Secondary | ICD-10-CM | POA: Insufficient documentation

## 2023-04-23 DIAGNOSIS — M353 Polymyalgia rheumatica: Secondary | ICD-10-CM | POA: Insufficient documentation

## 2023-04-23 DIAGNOSIS — N189 Chronic kidney disease, unspecified: Secondary | ICD-10-CM | POA: Insufficient documentation

## 2023-04-23 DIAGNOSIS — C4491 Basal cell carcinoma of skin, unspecified: Secondary | ICD-10-CM

## 2023-04-23 DIAGNOSIS — Z6841 Body Mass Index (BMI) 40.0 and over, adult: Secondary | ICD-10-CM | POA: Insufficient documentation

## 2023-04-23 DIAGNOSIS — E039 Hypothyroidism, unspecified: Secondary | ICD-10-CM

## 2023-04-23 DIAGNOSIS — C44612 Basal cell carcinoma of skin of right upper limb, including shoulder: Secondary | ICD-10-CM

## 2023-04-23 DIAGNOSIS — Z8679 Personal history of other diseases of the circulatory system: Secondary | ICD-10-CM | POA: Insufficient documentation

## 2023-04-23 DIAGNOSIS — Z79890 Hormone replacement therapy (postmenopausal): Secondary | ICD-10-CM | POA: Insufficient documentation

## 2023-04-23 DIAGNOSIS — M3500 Sicca syndrome, unspecified: Secondary | ICD-10-CM | POA: Diagnosis not present

## 2023-04-23 DIAGNOSIS — J449 Chronic obstructive pulmonary disease, unspecified: Secondary | ICD-10-CM | POA: Diagnosis not present

## 2023-04-23 DIAGNOSIS — J4489 Other specified chronic obstructive pulmonary disease: Secondary | ICD-10-CM | POA: Diagnosis not present

## 2023-04-23 DIAGNOSIS — M1990 Unspecified osteoarthritis, unspecified site: Secondary | ICD-10-CM | POA: Diagnosis not present

## 2023-04-23 DIAGNOSIS — I739 Peripheral vascular disease, unspecified: Secondary | ICD-10-CM | POA: Insufficient documentation

## 2023-04-23 DIAGNOSIS — L989 Disorder of the skin and subcutaneous tissue, unspecified: Secondary | ICD-10-CM | POA: Diagnosis not present

## 2023-04-23 DIAGNOSIS — M797 Fibromyalgia: Secondary | ICD-10-CM | POA: Insufficient documentation

## 2023-04-23 DIAGNOSIS — C44611 Basal cell carcinoma of skin of unspecified upper limb, including shoulder: Secondary | ICD-10-CM | POA: Insufficient documentation

## 2023-04-23 HISTORY — PX: PUNCH BIOPSY OF SKIN: SHX6390

## 2023-04-23 HISTORY — PX: LESION REMOVAL: SHX5196

## 2023-04-23 SURGERY — WIDE EXCISION, LESION, UPPER EXTREMITY
Anesthesia: General | Site: Foot | Laterality: Right

## 2023-04-23 MED ORDER — OXYCODONE HCL 5 MG/5ML PO SOLN: 5.0000 mg | Freq: Once | ORAL | Status: DC | PRN

## 2023-04-23 MED ORDER — MIDAZOLAM HCL 2 MG/2ML IJ SOLN
INTRAMUSCULAR | Status: AC
  Filled 2023-04-23: qty 2

## 2023-04-23 MED ORDER — ACETAMINOPHEN 500 MG PO TABS: 1000.0000 mg | ORAL_TABLET | Freq: Once | ORAL | Status: DC | PRN

## 2023-04-23 MED ORDER — CEFAZOLIN SODIUM-DEXTROSE 2-4 GM/100ML-% IV SOLN
2.0000 g | INTRAVENOUS | Status: AC
  Administered 2023-04-23: 2 g via INTRAVENOUS
  Filled 2023-04-23: qty 100

## 2023-04-23 MED ORDER — PROPOFOL 10 MG/ML IV BOLUS
INTRAVENOUS | Status: DC | PRN
  Administered 2023-04-23: 200 mg via INTRAVENOUS

## 2023-04-23 MED ORDER — FENTANYL CITRATE (PF) 250 MCG/5ML IJ SOLN
INTRAMUSCULAR | Status: AC
  Filled 2023-04-23: qty 5

## 2023-04-23 MED ORDER — ORAL CARE MOUTH RINSE: 15.0000 mL | Freq: Once | OROMUCOSAL | Status: AC

## 2023-04-23 MED ORDER — CHLORHEXIDINE GLUCONATE 0.12 % MT SOLN
15.0000 mL | Freq: Once | OROMUCOSAL | Status: AC
  Administered 2023-04-23: 15 mL via OROMUCOSAL
  Filled 2023-04-23: qty 15

## 2023-04-23 MED ORDER — ONDANSETRON HCL 4 MG/2ML IJ SOLN
INTRAMUSCULAR | Status: DC | PRN
  Administered 2023-04-23: 4 mg via INTRAVENOUS

## 2023-04-23 MED ORDER — MIDAZOLAM HCL 2 MG/2ML IJ SOLN
INTRAMUSCULAR | Status: DC | PRN
  Administered 2023-04-23: 2 mg via INTRAVENOUS

## 2023-04-23 MED ORDER — ACETAMINOPHEN 160 MG/5ML PO SOLN: 1000.0000 mg | Freq: Once | ORAL | Status: DC | PRN

## 2023-04-23 MED ORDER — SODIUM CHLORIDE 0.9 % IV SOLN: INTRAVENOUS | Status: DC

## 2023-04-23 MED ORDER — ROCURONIUM BROMIDE 10 MG/ML (PF) SYRINGE
PREFILLED_SYRINGE | INTRAVENOUS | Status: DC | PRN
  Administered 2023-04-23: 50 mg via INTRAVENOUS

## 2023-04-23 MED ORDER — ACETAMINOPHEN 10 MG/ML IV SOLN: 1000.0000 mg | Freq: Once | INTRAVENOUS | Status: DC | PRN

## 2023-04-23 MED ORDER — LIDOCAINE 2% (20 MG/ML) 5 ML SYRINGE
INTRAMUSCULAR | Status: DC | PRN
  Administered 2023-04-23: 60 mg via INTRAVENOUS

## 2023-04-23 MED ORDER — FENTANYL CITRATE (PF) 250 MCG/5ML IJ SOLN
INTRAMUSCULAR | Status: DC | PRN
  Administered 2023-04-23: 100 ug via INTRAVENOUS

## 2023-04-23 MED ORDER — LIDOCAINE 2% (20 MG/ML) 5 ML SYRINGE
INTRAMUSCULAR | Status: AC
  Filled 2023-04-23: qty 5

## 2023-04-23 MED ORDER — OXYCODONE HCL 5 MG PO TABS: 5.0000 mg | ORAL_TABLET | Freq: Once | ORAL | Status: DC | PRN

## 2023-04-23 MED ORDER — ROCURONIUM BROMIDE 10 MG/ML (PF) SYRINGE
PREFILLED_SYRINGE | INTRAVENOUS | Status: AC
  Filled 2023-04-23: qty 10

## 2023-04-23 MED ORDER — CHLORHEXIDINE GLUCONATE CLOTH 2 % EX PADS: 6.0000 | MEDICATED_PAD | Freq: Once | CUTANEOUS | Status: DC

## 2023-04-23 MED ORDER — BUPIVACAINE-EPINEPHRINE 0.25% -1:200000 IJ SOLN
INTRAMUSCULAR | Status: DC | PRN
  Administered 2023-04-23: 30 mL

## 2023-04-23 MED ORDER — ONDANSETRON HCL 4 MG/2ML IJ SOLN
INTRAMUSCULAR | Status: AC
  Filled 2023-04-23: qty 2

## 2023-04-23 MED ORDER — PROPOFOL 10 MG/ML IV BOLUS
INTRAVENOUS | Status: AC
  Filled 2023-04-23: qty 20

## 2023-04-23 MED ORDER — SUGAMMADEX SODIUM 200 MG/2ML IV SOLN
INTRAVENOUS | Status: DC | PRN
  Administered 2023-04-23: 200 mg via INTRAVENOUS

## 2023-04-23 MED ORDER — PHENYLEPHRINE 80 MCG/ML (10ML) SYRINGE FOR IV PUSH (FOR BLOOD PRESSURE SUPPORT)
PREFILLED_SYRINGE | INTRAVENOUS | Status: DC | PRN
  Administered 2023-04-23: 80 ug via INTRAVENOUS
  Administered 2023-04-23: 160 ug via INTRAVENOUS
  Administered 2023-04-23: 80 ug via INTRAVENOUS

## 2023-04-23 MED ORDER — FENTANYL CITRATE (PF) 100 MCG/2ML IJ SOLN: 25.0000 ug | INTRAMUSCULAR | Status: DC | PRN

## 2023-04-23 MED ORDER — PHENYLEPHRINE 80 MCG/ML (10ML) SYRINGE FOR IV PUSH (FOR BLOOD PRESSURE SUPPORT)
PREFILLED_SYRINGE | INTRAVENOUS | Status: AC
  Filled 2023-04-23: qty 10

## 2023-04-23 MED ORDER — 0.9 % SODIUM CHLORIDE (POUR BTL) OPTIME
TOPICAL | Status: DC | PRN
Start: 2023-04-23 — End: 2023-04-23
  Administered 2023-04-23: 1000 mL

## 2023-04-23 SURGICAL SUPPLY — 39 items
ADH SKN CLS APL DERMABOND .7 (GAUZE/BANDAGES/DRESSINGS) ×4
BAG COUNTER SPONGE SURGICOUNT (BAG) ×2 IMPLANT
BAG SPNG CNTER NS LX DISP (BAG) ×2
CANISTER SUCT 3000ML PPV (MISCELLANEOUS) IMPLANT
CNTNR URN SCR LID CUP LEK RST (MISCELLANEOUS) ×2 IMPLANT
CONT SPEC 4OZ STRL OR WHT (MISCELLANEOUS) ×2
COVER SURGICAL LIGHT HANDLE (MISCELLANEOUS) ×2 IMPLANT
DERMABOND ADVANCED .7 DNX12 (GAUZE/BANDAGES/DRESSINGS) IMPLANT
DRESSING MEPILEX FLEX 4X4 (GAUZE/BANDAGES/DRESSINGS) IMPLANT
DRSG EMULSION OIL 3X3 NADH (GAUZE/BANDAGES/DRESSINGS) IMPLANT
DRSG MEPILEX FLEX 4X4 (GAUZE/BANDAGES/DRESSINGS) ×4
ELECT CAUTERY BLADE 6.4 (BLADE) IMPLANT
ELECT NDL TIP 2.8 STRL (NEEDLE) IMPLANT
ELECT NEEDLE TIP 2.8 STRL (NEEDLE)
ELECT REM PT RETURN 9FT ADLT (ELECTROSURGICAL) ×2
ELECTRODE REM PT RTRN 9FT ADLT (ELECTROSURGICAL) ×2 IMPLANT
GAUZE 4X4 16PLY ~~LOC~~+RFID DBL (SPONGE) ×2 IMPLANT
GAUZE SPONGE 4X4 12PLY STRL (GAUZE/BANDAGES/DRESSINGS) IMPLANT
GLOVE BIO SURGEON STRL SZ 6.5 (GLOVE) ×2 IMPLANT
GOWN STRL REUS W/ TWL LRG LVL3 (GOWN DISPOSABLE) ×4 IMPLANT
GOWN STRL REUS W/TWL LRG LVL3 (GOWN DISPOSABLE) ×4
KIT BASIN OR (CUSTOM PROCEDURE TRAY) ×2 IMPLANT
KIT TURNOVER KIT B (KITS) ×2 IMPLANT
NDL HYPO 25GX1X1/2 BEV (NEEDLE) ×2 IMPLANT
NEEDLE HYPO 25GX1X1/2 BEV (NEEDLE) ×2
NS IRRIG 1000ML POUR BTL (IV SOLUTION) ×2 IMPLANT
PACK BASIC III (CUSTOM PROCEDURE TRAY) ×2
PACK SRG BSC III STRL LF ECLPS (CUSTOM PROCEDURE TRAY) ×2 IMPLANT
PAD ARMBOARD 7.5X6 YLW CONV (MISCELLANEOUS) ×2 IMPLANT
PENCIL BUTTON HOLSTER BLD 10FT (ELECTRODE) ×2 IMPLANT
STAPLER VISISTAT 35W (STAPLE) IMPLANT
SUT MNCRL AB 3-0 PS2 18 (SUTURE) IMPLANT
SUT MNCRL AB 4-0 PS2 18 (SUTURE) IMPLANT
SUT SILK 2 0 PERMA HAND 18 BK (SUTURE) IMPLANT
SYR BULB EAR ULCER 3OZ GRN STR (SYRINGE) IMPLANT
SYR CONTROL 10ML LL (SYRINGE) IMPLANT
TOWEL GREEN STERILE (TOWEL DISPOSABLE) ×2 IMPLANT
TOWEL GREEN STERILE FF (TOWEL DISPOSABLE) ×2 IMPLANT
YANKAUER SUCT BULB TIP NO VENT (SUCTIONS) IMPLANT

## 2023-04-23 NOTE — Transfer of Care (Signed)
Immediate Anesthesia Transfer of Care Note  Patient: Catherine Turner  Procedure(s) Performed: EXCISION OF RIGHT UPPER ARM BASAL CELL CARCINOMA (Right: Arm Upper) EXCISION OF LEFT FOOT LESION (Left: Foot)  Patient Location: PACU  Anesthesia Type:General  Level of Consciousness: awake and drowsy  Airway & Oxygen Therapy: Patient Spontanous Breathing and Patient connected to face mask oxygen  Post-op Assessment: Report given to RN, Post -op Vital signs reviewed and stable, and Patient moving all extremities  Post vital signs: Reviewed and stable  Last Vitals:  Vitals Value Taken Time  BP 158/80 04/23/23 1221  Temp 36.6 C 04/23/23 1220  Pulse 87 04/23/23 1224  Resp 20 04/23/23 1224  SpO2 97 % 04/23/23 1224  Vitals shown include unfiled device data.  Last Pain:  Vitals:   04/23/23 0954  TempSrc:   PainSc: 8       Patients Stated Pain Goal: 3 (04/23/23 0954)  Complications: No notable events documented.

## 2023-04-23 NOTE — Discharge Instructions (Signed)
Diet: High protein, low sugar.  Medications: Please take the Zofran as needed for nausea symptoms.  As for pain control, please take ibuprofen 400 mg every 6 hours as well as Tylenol 500 mg every 6 hours as needed.  The prescribed oxycodone should be taken only as needed for breakthrough pain.  Please note that this drug can make you drowsy.  Wound care: Your excision sites were each closed with stitches followed by skin glue.  We have also placed an absorbent bandage over the top.  Please leave it in place until you come into clinic for your initial post-op visit.  No specific wound care is needed until then.  You may shower normally.  Activity: Please limit right arm movement. No heavy lifting, pushing, or pulling.    Mild drainage from underneath the dressing is OK. However, please call the office should you have severe pain or swelling, surrounding redness or malodor, wound dehiscence (if it completely opens), or if you have any fevers.

## 2023-04-23 NOTE — Anesthesia Procedure Notes (Addendum)
Procedure Name: Intubation Date/Time: 04/23/2023 11:31 AM  Performed by: Eulah Pont, CRNAPre-anesthesia Checklist: Patient identified, Emergency Drugs available, Suction available and Patient being monitored Patient Re-evaluated:Patient Re-evaluated prior to induction Oxygen Delivery Method: Circle System Utilized Preoxygenation: Pre-oxygenation with 100% oxygen Induction Type: IV induction Ventilation: Mask ventilation without difficulty Laryngoscope Size: Miller and 2 Grade View: Grade II Tube type: Oral Tube size: 7.0 mm Number of attempts: 1 Airway Equipment and Method: Stylet and Oral airway Placement Confirmation: ETT inserted through vocal cords under direct vision, positive ETCO2 and breath sounds checked- equal and bilateral Secured at: 20 cm Tube secured with: Tape Dental Injury: Teeth and Oropharynx as per pre-operative assessment

## 2023-04-23 NOTE — Interval H&P Note (Signed)
History and Physical Interval Note: No change in exam or indication for surgery. All questions answered. Marked with her assistance. Will proceed at her request   04/23/2023 11:02 AM  Catherine Turner  has presented today for surgery, with the diagnosis of BASAL CELL CARCINOMA.  The various methods of treatment have been discussed with the patient and family. After consideration of risks, benefits and other options for treatment, the patient has consented to  Procedure(s): EXCISION OF RIGHT UPPER ARM BASAL CELL CARCINOMA (Right) biopsies of skin, left foot (Left) as a surgical intervention.  The patient's history has been reviewed, patient examined, no change in status, stable for surgery.  I have reviewed the patient's chart and labs.  Questions were answered to the patient's satisfaction.     Santiago Glad

## 2023-04-23 NOTE — Op Note (Signed)
DATE OF OPERATION: 04/23/2023  LOCATION: [facility] operating Room  PREOPERATIVE DIAGNOSIS: Right upper arm basal cell carcinoma, left ankle skin lesion  POSTOPERATIVE DIAGNOSIS: Same  PROCEDURE: Excision of right upper arm basal cell carcinoma with 5 mm margins and excisional biopsy of left ankle skin lesion  SURGEON: Loren Racer, MD  ASSISTANT: Evelena Leyden  EBL: 10 cc  CONDITION: Stable  COMPLICATIONS: None  INDICATION: The patient, Catherine Turner, is a 67 y.o. female born on Nov 10, 1955, is here for treatment patient was referred to the office for excision of a biopsy-proven basal cell carcinoma on her right upper extremity.  She also noted that she had a lesion that was concerning to her on her left ankle.  She felt this lesion looked very similar to the initial appearance of the basal cell carcinoma.  She requested that I excise it.Marland Kitchen   PROCEDURE DETAILS:  The patient was seen prior to surgery and marked.  The IV antibiotics were given. The patient was taken to the operating room and given a general anesthetic. A standard time out was performed and all information was confirmed by those in the room. SCDs were placed.   The right arm and left ankle were prepped and draped in usual sterile manner.  The tissues were infiltrated with quarter percent Marcaine with epinephrine.  An elliptical incision was made around the right arm skin lesion taking 5 mm margins.  The lesion was excised down to the level of the subcutaneous fat taking a small portion of fat with the lesion.  The lesion was then marked with sutures with the superior margin marked with a short suture and the lateral margin marked with a long suture.  The tissue was sent to pathology for routine examination.  The wound was inspected for bleeding and hemostasis achieved with the electrocautery.  The dermis was approximated with interrupted 3-0 Monocryl sutures and skin was closed with a running 4-0 Monocryl subcuticular stitch.   Attention was turned to the left ankle where similar procedure was performed.  An elliptical incision was made around the skin lesion and the lesion was excised down to the subcutaneous fat.  Again it was marked with sutures short superior and long lateral.  The defect on the left leg was closed with interrupted 3-0 Monocryl sutures in the dermis and a running 4-0 Monocryl subcuticular stitch.  The length of the arm incision was 6 cm and the length of the ankle incision was 5 cm.  The patient was awakened from anesthesia without incident transferred to the recovery room in good condition.  All instrument needle and sponge counts were reported as correct and there were no complications. The patient was allowed to wake up and taken to recovery room in stable condition at the end of the case. The family was notified at the end of the case.   The advanced practice practitioner (APP) assisted throughout the case.  The APP was essential in retraction and counter traction when needed to make the case progress smoothly.  This retraction and assistance made it possible to see the tissue plans for the procedure.  The assistance was needed for blood control, tissue re-approximation and assisted with closure of the incision site.

## 2023-04-24 ENCOUNTER — Telehealth: Payer: Self-pay | Admitting: Plastic Surgery

## 2023-04-24 ENCOUNTER — Encounter (HOSPITAL_COMMUNITY): Payer: Self-pay | Admitting: Plastic Surgery

## 2023-04-24 NOTE — Telephone Encounter (Signed)
Spoke with patient. She can replace with Band-Aid if the bordered Mepilex falls off. Discussed activity modifications, specifically no right arm lifting, pushing, or pulling.

## 2023-04-24 NOTE — Telephone Encounter (Signed)
Patient called and stated she had surgery yesterday on her bicep and Left leg.  When she woke up this morning the bandage on her leg was rolled up.  She rolled it back down and put 2 big bandaids on it to hold it down.  She wanted to know if that was ok to do until her appt next week or if she should do something else. Please call her at [phone].

## 2023-04-24 NOTE — Anesthesia Postprocedure Evaluation (Signed)
Anesthesia Post Note  Patient: Catherine Turner  Procedure(s) Performed: EXCISION OF RIGHT UPPER ARM BASAL CELL CARCINOMA (Right: Arm Upper) EXCISION OF LEFT FOOT LESION (Left: Foot)     Patient location during evaluation: PACU Anesthesia Type: General Level of consciousness: awake and alert Pain management: pain level controlled Vital Signs Assessment: post-procedure vital signs reviewed and stable Respiratory status: spontaneous breathing, nonlabored ventilation, respiratory function stable and patient connected to nasal cannula oxygen Cardiovascular status: blood pressure returned to baseline and stable Postop Assessment: no apparent nausea or vomiting Anesthetic complications: no   No notable events documented.  Last Vitals:  Vitals:   04/23/23 1245 04/23/23 1255  BP: 115/66 (!) 129/59  Pulse: 84 78  Resp: 20 20  Temp:  36.6 C  SpO2: 97% 100%    Last Pain:  Vitals:   04/23/23 1220  TempSrc:   PainSc: Asleep                 Kenneth Lax

## 2023-05-02 ENCOUNTER — Ambulatory Visit (INDEPENDENT_AMBULATORY_CARE_PROVIDER_SITE_OTHER): Payer: 59 | Admitting: Plastic Surgery

## 2023-05-02 ENCOUNTER — Encounter: Payer: Self-pay | Admitting: Plastic Surgery

## 2023-05-02 VITALS — BP 152/71 | HR 78 | Ht 64.0 in | Wt 254.0 lb

## 2023-05-02 DIAGNOSIS — C44612 Basal cell carcinoma of skin of right upper limb, including shoulder: Secondary | ICD-10-CM

## 2023-05-02 NOTE — Progress Notes (Signed)
Catherine Turner returns today for follow-up after excision of a biopsy-proven basal cell carcinoma on the right upper extremity and biopsy of a concerning lesion on the left leg in the pretibial region.  She is doing well with no specific complaints.  The incisions are healing well and are clean dry and intact.  She is not having any pain.  Pathology returned with residual basal cell carcinoma in the right upper extremity completely excised and all margins clear. The left leg wound was inflammation without evidence of malignancy.  Status post excision of basal cell carcinoma.  She still has multiple skin lesions and should have a follow-up with a dermatologist.  She has requested that I place a consult for her.  Will asked Dr. Onalee Hua to evaluate.  Follow-up in 1 month.

## 2023-05-14 ENCOUNTER — Encounter: Payer: 59 | Admitting: Surgical

## 2023-05-30 ENCOUNTER — Encounter: Payer: Self-pay | Admitting: Surgical

## 2023-05-30 ENCOUNTER — Ambulatory Visit: Payer: 59 | Admitting: Surgical

## 2023-05-30 VITALS — BP 147/84 | HR 80 | Ht 64.0 in | Wt 255.4 lb

## 2023-05-30 DIAGNOSIS — C44612 Basal cell carcinoma of skin of right upper limb, including shoulder: Secondary | ICD-10-CM

## 2023-05-30 NOTE — Progress Notes (Signed)
67 year old female here for follow-up after excision of basal cell carcinoma on the right upper extremity and biopsy of a concerning lesion on the left leg.  She is 5 weeks postop.  She is overall doing well.  She has no specific complaints.  The incisions are healing well.  She does feel as if they are healing slowly, but does agree that they are healing.  She reports she has an appoint with dermatology in April to establish care  Recommend following up as needed.  Call with questions or concerns.

## 2023-07-24 ENCOUNTER — Other Ambulatory Visit: Payer: Self-pay | Admitting: Medical Genetics

## 2023-07-30 ENCOUNTER — Other Ambulatory Visit (HOSPITAL_COMMUNITY)
Admission: RE | Admit: 2023-07-30 | Discharge: 2023-07-30 | Disposition: A | Discharge status: Home / Self Care | Payer: Self-pay | Source: Ambulatory Visit | Attending: Oncology | Admitting: Oncology

## 2023-08-13 LAB — GENECONNECT MOLECULAR SCREEN: Genetic Analysis Overall Interpretation: NEGATIVE

## 2023-10-11 ENCOUNTER — Telehealth: Payer: Self-pay

## 2023-10-11 ENCOUNTER — Ambulatory Visit: Payer: 59 | Admitting: Plastic Surgery

## 2023-10-11 VITALS — BP 127/67 | HR 99

## 2023-10-11 DIAGNOSIS — L989 Disorder of the skin and subcutaneous tissue, unspecified: Secondary | ICD-10-CM | POA: Diagnosis not present

## 2023-10-11 NOTE — Telephone Encounter (Signed)
 Faxed Request for Surgical Clearance form to patient's PCP: [facility], Dr. Sherril Croon, with confirmed receipt. Phone: [phone] Fax: [phone]

## 2023-10-11 NOTE — Progress Notes (Signed)
 Referring Provider Ignatius Specking, MD [address] [place],  [place] [address]   CC: No chief complaint on file.     Catherine Turner is an 68 y.o. female.  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  She is referred for excision of the lesion as her dermatologist does not believe that topical therapies are appropriate due to the patient's autoimmune diseases.  Catherine Turner states that she has had the lesion since at least 2010 and it has been treated by various dermatologist with various treatments but is never completely resolved.  A biopsy at [facility] dermatology showed carcinoma in situ with atypia.  Allergies  Allergen Reactions   Advair Diskus [Fluticasone-Salmeterol] Other (See Comments)    Blood in urine   Ciprofloxacin Anaphylaxis   Clindamycin/Lincomycin Anaphylaxis   Escitalopram Anaphylaxis   Jasmine Fragrance Anaphylaxis   Beef-Derived Drug Products    Breo Ellipta [Fluticasone Furoate-Vilanterol] Other (See Comments)    Swelling of the abdomin.    Diclofenac Other (See Comments)    unknown   Furosemide Swelling   Gabapentin     depressed   Levothyroxine Other (See Comments)    Allergic to pork/tree pollens. Needs plant based thyroid medication.   Molds & Smuts Other (See Comments)    unknown   Peanuts [Peanut Oil]    Pork-Derived Products    Potassium-Containing Compounds Swelling   Pravastatin Other (See Comments)    unknown   Tilactase Other (See Comments)    unknown   Latex Rash   Sulfa Antibiotics Rash    Outpatient Encounter Medications as of 10/11/2023  Medication Sig Note   albuterol (PROVENTIL) (2.5 MG/3ML) 0.083% nebulizer solution Take 3 mLs (2.5 mg total) by nebulization every 6 (six) hours as needed for wheezing.    albuterol (VENTOLIN HFA) 108 (90 Base) MCG/ACT inhaler Inhale 2 puffs into the lungs every 4 (four) hours as needed for wheezing.    diphenhydrAMINE (BENADRYL) 50 MG tablet Take 50 mg by  mouth at bedtime.    EPINEPHrine 0.3 mg/0.3 mL IJ SOAJ injection Inject 0.3 mg into the muscle as needed for anaphylaxis.    HYDROcodone-acetaminophen (NORCO/VICODIN) 5-325 MG tablet Take 1 tablet by mouth daily as needed for severe pain.    loratadine (CLARITIN) 10 MG tablet Take 10 mg by mouth daily.    LUMIGAN 0.01 % SOLN Place 1 drop into both eyes at bedtime.    Melatonin 10 MG TABS Take 10 mg by mouth at bedtime.    naproxen sodium (ALEVE) 220 MG tablet Take 220 mg by mouth daily as needed (pain).    ondansetron (ZOFRAN) 4 MG tablet Take 1 tablet (4 mg total) by mouth every 8 (eight) hours as needed for nausea or vomiting.    RESTASIS 0.05 % ophthalmic emulsion Place 1 drop into both eyes 2 (two) times daily as needed (dry eyes).    TIROSINT-SOL 100 MCG/ML SOLN Take 90 mcg by mouth daily.    Ascorbic Acid (VITAMIN C) 500 MG CAPS Take 500 mg by mouth daily. (Patient not taking: Reported on 10/11/2023) 04/04/2023: On hold for procedure   cholecalciferol (VITAMIN D3) 25 MCG (1000 UNIT) tablet Take 1,000 Units by mouth daily. (Patient not taking: Reported on 10/11/2023) 04/04/2023: On hold for procedure   [DISCONTINUED] vitamin B-12 (CYANOCOBALAMIN) 500 MCG tablet Take 500 mcg by mouth daily. 04/04/2023: On hold for procedure   No facility-administered encounter medications on  file as of 10/11/2023.     Past Medical History:  Diagnosis Date   Angio-edema    Arm fracture    right arm    Asthma    Bulging lumbar disc    Cancer (HCC)    basal cell carcinoma   Complication of anesthesia    pt sts, "I am a very shallow breather and they have to do something special for me"/   COPD (chronic obstructive pulmonary disease) (HCC)    Degenerative joint disease (DJD) of hip    DJD (degenerative joint disease) of hip    Eczema    Elevated LFTs    Hip pain    Hypothyroidism    Insomnia    Nerve damage    right arm.   PTSD (post-traumatic stress disorder)    Sjogren syndrome, unspecified (HCC)     Sleep apnea    has CPAP   Thyroid disease    Urticaria     Past Surgical History:  Procedure Laterality Date   COLONOSCOPY N/A 08/12/2014   Procedure: COLONOSCOPY;  Surgeon: Corbin Ade, MD;  Location: [facility];  Service: Endoscopy;  Laterality: N/A;  115   LESION REMOVAL Right 04/23/2023   Procedure: EXCISION OF RIGHT UPPER ARM BASAL CELL CARCINOMA;  Surgeon: Santiago Glad, MD;  Location: [facility] OR;  Service: Plastics;  Laterality: Right;   PUNCH BIOPSY OF SKIN Left 04/23/2023   Procedure: EXCISION OF LEFT FOOT LESION;  Surgeon: Santiago Glad, MD;  Location: [facility] OR;  Service: Plastics;  Laterality: Left;   thumb surgery     TOOTH EXTRACTION  04/2022   UMBILICAL HERNIA REPAIR N/A 12/30/2014   Procedure: HERNIA REPAIR UMBILICAL ADULT WITH MESH;  Surgeon: Franky Macho Md, MD;  Location: [facility] ORS;  Service: General;  Laterality: N/A;    Family History  Problem Relation Age of Onset   COPD Mother    Cancer - Lung Mother    Cancer Father    Hypertension Father    Drug abuse Sister    Dementia Maternal Aunt    Alcohol abuse Paternal Aunt    Alcohol abuse Paternal Uncle    Heart disease Maternal Grandmother    Heart disease Maternal Grandfather    Heart disease Paternal Grandmother    Heart disease Paternal Grandfather    Heart attack Paternal Grandfather    Physical abuse Other    Anxiety disorder Other    Birth defects Other        Downs Synddrome   ADD / ADHD Neg Hx    Bipolar disorder Neg Hx    OCD Neg Hx    Paranoid behavior Neg Hx    Schizophrenia Neg Hx    Seizures Neg Hx    Sexual abuse Neg Hx    Allergic rhinitis Neg Hx    Asthma Neg Hx    Eczema Neg Hx    Urticaria Neg Hx    Breast cancer Neg Hx     Social History   Social History Narrative   Not on file     Review of Systems General: Denies fevers, chills, weight loss CV: Denies chest pain, shortness of breath, palpitations Skin: An area of discoloration of the patient states has been there for  many years.  Physical Exam    10/11/2023    8:12 AM 05/30/2023    8:20 AM 05/02/2023    8:24 AM  Vitals with BMI  Height  5\' 4"  5\' 4"   Weight  255 lbs 6 oz 254 lbs  BMI  43.82 43.58  Systolic 127 147 191  Diastolic 67 84 71  Pulse 99 80 78    General:  No acute distress,  Alert and oriented, Non-Toxic, Normal speech and affect Integument: There is a 1 x 1 cm area of discoloration which correlates with the photographs forwarded by the dermatology office.  Accompanying pathology shows atypia in the area and excision is requested there are no palpable lesions in the area. Mammogram: Mammogram June 2024 BI-RADS 1 Assessment/Plan Skin lesion: Patient has atypia versus carcinoma in situ on the left cheek and is referred for excision of the lesion.  I explained to Ms. Gillingham that this is not a Mohs excision and there is no way for me to know if the margins will be clear or not at the time of excision and that this may result in a second procedure.  She understands and has asked that I proceed.  Photographs were obtained today with her consent.  All questions were answered to her satisfaction.  Will schedule her for excision of the skin lesion at her request.  A clearance by her primary care provider has been requested  Santiago Glad 10/11/2023, 8:44 AM

## 2023-11-14 ENCOUNTER — Ambulatory Visit: Payer: 59 | Admitting: Dermatology

## 2023-11-20 ENCOUNTER — Encounter (HOSPITAL_BASED_OUTPATIENT_CLINIC_OR_DEPARTMENT_OTHER): Payer: Self-pay | Admitting: Plastic Surgery

## 2023-11-20 ENCOUNTER — Ambulatory Visit: Admitting: Student

## 2023-11-20 ENCOUNTER — Other Ambulatory Visit: Payer: Self-pay

## 2023-11-20 VITALS — BP 135/59 | HR 74 | Wt 255.4 lb

## 2023-11-20 DIAGNOSIS — D0439 Carcinoma in situ of skin of other parts of face: Secondary | ICD-10-CM

## 2023-11-20 MED ORDER — HYDROCODONE-ACETAMINOPHEN 5-325 MG PO TABS: 1.0000 | ORAL_TABLET | Freq: Four times a day (QID) | ORAL | 0 refills | Status: AC | PRN

## 2023-11-20 NOTE — H&P (View-Only) (Signed)
 Patient ID: Catherine Turner, female    DOB: [birth year], 68 y.o.   MRN: [id number]  Chief Complaint  Patient presents with   Pre-op Exam      ICD-10-CM   1. 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  female  with a history of squamous cell carcinoma.  She presents for preoperative evaluation for upcoming procedure, excision of left cheek lesion, scheduled for 12/04/2023 with Dr. Ladona Ridgel.  The patient has not had problems with anesthesia.  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  She denies any issues with her heart since then.  She states that she got clearance prior to her previous surgery.  She denies any chest pain or shortness of breath.  She denies taking any blood thinners.  Patient reports she is not a smoker.  Patient denies taking any birth control or hormone replacement.  She reports history of 1 miscarriage.  She denies any personal or family history of blood clots or clotting diseases.  She denies any recent surgeries, traumas, or infections.  She denies any history of stroke or heart attack.  She denies any history of Crohn's disease or ulcerative colitis.  She does report history of OSA, but does not use CPAP.  She does report history of asthma.  She does report history of skin cancer, otherwise denies any other history of cancer.  She denies any varicosities to her lower extremities, but she does report her legs do swell.  She denies any recent fevers, chills or changes in her health.  Patient does reports she has a latex allergy.  Summary of Previous Visit: Patient was most recently seen by Dr. Ladona Ridgel on 10/11/2023.  At this visit, patient was noted to have a new diagnosis of squamous cell carcinoma in situ on the left cheek.  She was referred for excision of the lesion as her dermatologist did not believe  topical therapies were appropriate due to patient's autoimmune disease.  Patient reported that she had the lesion since at least 2010 and it was treated by various dermatologists but never completely resolved.  On exam, there was a 1 x 1 cm area of discoloration.  Pathology showed atypia in the area.  Per chart review, surgical clearance was sent to patient's PCP at [facility] internal medicine.  Clearance was received back from PCP on 11/06/2023.  Per chart review, it was noted that patient was cleared for her surgery in September and that she was at low risk for the procedure.  Since then, patient denies any cardiac issues or changes in her health.  Job: Does not work at this time  PMH Significant for: Aortic arch anomaly, OSA, asthma, hypothyroidism, PTSD, anxiety, thoracic outlet syndrome, lupus   Past Medical History: Allergies: Allergies  Allergen Reactions   Advair Diskus [Fluticasone-Salmeterol] Other (See Comments)    Blood in urine   Ciprofloxacin Anaphylaxis   Clindamycin/Lincomycin Anaphylaxis   Escitalopram Anaphylaxis   Jasmine Fragrance Anaphylaxis   Beef-Derived Drug Products    Breo Ellipta [Fluticasone Furoate-Vilanterol] Other (See Comments)    Swelling of the abdomin.    Diclofenac Other (See Comments)    unknown   Furosemide Swelling   Gabapentin     depressed   Levothyroxine Other (See Comments)  Allergic to pork/tree pollens. Needs plant based thyroid medication.   Molds & Smuts Other (See Comments)    unknown   Peanuts [Peanut Oil]    Pork-Derived Products    Potassium-Containing Compounds Swelling   Pravastatin Other (See Comments)    unknown   Tilactase Other (See Comments)    unknown   Latex Rash   Sulfa Antibiotics Rash    Current Medications:  Current Outpatient Medications:    albuterol (PROVENTIL) (2.5 MG/3ML) 0.083% nebulizer solution, Take 3 mLs (2.5 mg total) by nebulization every 6 (six) hours as needed for wheezing., Disp: 75 mL, Rfl: prn    albuterol (VENTOLIN HFA) 108 (90 Base) MCG/ACT inhaler, Inhale 2 puffs into the lungs every 4 (four) hours as needed for wheezing., Disp: 1 each, Rfl: prn   diphenhydrAMINE (BENADRYL) 50 MG tablet, Take 50 mg by mouth at bedtime., Disp: , Rfl:    EPINEPHrine 0.3 mg/0.3 mL IJ SOAJ injection, Inject 0.3 mg into the muscle as needed for anaphylaxis., Disp: , Rfl:    HYDROcodone-acetaminophen (NORCO/VICODIN) 5-325 MG tablet, Take 1 tablet by mouth daily as needed for severe pain., Disp: , Rfl:    loratadine (CLARITIN) 10 MG tablet, Take 10 mg by mouth daily., Disp: , Rfl:    LUMIGAN 0.01 % SOLN, Place 1 drop into both eyes at bedtime., Disp: , Rfl:    Melatonin 10 MG TABS, Take 10 mg by mouth at bedtime., Disp: , Rfl:    naproxen sodium (ALEVE) 220 MG tablet, Take 220 mg by mouth daily as needed (pain)., Disp: , Rfl:    ondansetron (ZOFRAN) 4 MG tablet, Take 1 tablet (4 mg total) by mouth every 8 (eight) hours as needed for nausea or vomiting., Disp: 20 tablet, Rfl: 0   RESTASIS 0.05 % ophthalmic emulsion, Place 1 drop into both eyes 2 (two) times daily as needed (dry eyes)., Disp: , Rfl:    TIROSINT-SOL 100 MCG/ML SOLN, Take 90 mcg by mouth daily., Disp: , Rfl:    Ascorbic Acid (VITAMIN C) 500 MG CAPS, Take 500 mg by mouth daily. (Patient not taking: Reported on 10/11/2023), Disp: , Rfl:    cholecalciferol (VITAMIN D3) 25 MCG (1000 UNIT) tablet, Take 1,000 Units by mouth daily. (Patient not taking: Reported on 10/11/2023), Disp: , Rfl:   Past Medical Problems: Past Medical History:  Diagnosis Date   Angio-edema    Arm fracture    right arm    Asthma    Bulging lumbar disc    Cancer (HCC)    basal cell carcinoma   Complication of anesthesia    pt sts, "I am a very shallow breather and they have to do something special for me"/   COPD (chronic obstructive pulmonary disease) (HCC)    Degenerative joint disease (DJD) of hip    DJD (degenerative joint disease) of hip    Eczema    Elevated LFTs     Hip pain    Hypothyroidism    Insomnia    Nerve damage    right arm.   PTSD (post-traumatic stress disorder)    Sjogren syndrome, unspecified (HCC)    Sleep apnea    has CPAP   Thyroid disease    Urticaria     Past Surgical History: Past Surgical History:  Procedure Laterality Date   COLONOSCOPY N/A 08/12/2014   Procedure: COLONOSCOPY;  Surgeon: Suzette Espy, MD;  Location: [facility];  Service: Endoscopy;  Laterality: N/A;  115   LESION REMOVAL Right  04/23/2023   Procedure: EXCISION OF RIGHT UPPER ARM BASAL CELL CARCINOMA;  Surgeon: Teretha Ferguson, MD;  Location: [facility] OR;  Service: Plastics;  Laterality: Right;   PUNCH BIOPSY OF SKIN Left 04/23/2023   Procedure: EXCISION OF LEFT FOOT LESION;  Surgeon: Teretha Ferguson, MD;  Location: [facility] OR;  Service: Plastics;  Laterality: Left;   thumb surgery     TOOTH EXTRACTION  04/2022   UMBILICAL HERNIA REPAIR N/A 12/30/2014   Procedure: HERNIA REPAIR UMBILICAL ADULT WITH MESH;  Surgeon: Alanda Allegra Md, MD;  Location: [facility];  Service: General;  Laterality: N/A;    Social History: Social History   Socioeconomic History   Marital status: Single    Spouse name: Not on file   Number of children: Not on file   Years of education: Not on file   Highest education level: Not on file  Occupational History   Not on file  Tobacco Use   Smoking status: Never    Passive exposure: Yes   Smokeless tobacco: Never  Vaping Use   Vaping status: Never Used  Substance and Sexual Activity   Alcohol use: Yes    Comment: 4 oz wine    Drug use: No   Sexual activity: Never    Birth control/protection: None  Other Topics Concern   Not on file  Social History Narrative   Not on file   Social Drivers of Health   Financial Resource Strain: Not on file  Food Insecurity: Not on file  Transportation Needs: Not on file  Physical Activity: Insufficiently Active (01/22/2023)   Received from [facility], [facility]   Exercise Vital  Sign    Days of Exercise per Week: 7 days    Minutes of Exercise per Session: 20 min  Stress: Not on file  Social Connections: Not on file  Intimate Partner Violence: Not At Risk (01/22/2023)   Received from [facility], [facility]   Humiliation, Afraid, Rape, and Kick questionnaire    Fear of Current or Ex-Partner: No    Emotionally Abused: No    Physically Abused: No    Sexually Abused: No    Family History: Family History  Problem Relation Age of Onset   COPD Mother    Cancer - Lung Mother    Cancer Father    Hypertension Father    Drug abuse Sister    Dementia Maternal Aunt    Alcohol abuse Paternal Aunt    Alcohol abuse Paternal Uncle    Heart disease Maternal Grandmother    Heart disease Maternal Grandfather    Heart disease Paternal Grandmother    Heart disease Paternal Grandfather    Heart attack Paternal Grandfather    Physical abuse Other    Anxiety disorder Other    Birth defects Other        Downs Synddrome   ADD / ADHD Neg Hx    Bipolar disorder Neg Hx    OCD Neg Hx    Paranoid behavior Neg Hx    Schizophrenia Neg Hx    Seizures Neg Hx    Sexual abuse Neg Hx    Allergic rhinitis Neg Hx    Asthma Neg Hx    Eczema Neg Hx    Urticaria Neg Hx    Breast cancer Neg Hx     Review of Systems: Denies any recent fevers, chills or changes in her health  Physical Exam: Vital Signs BP (!) 135/59 (BP Location: Left  Arm, Patient Position: Sitting, Cuff Size: Large)   Pulse 74   Wt 255 lb 6.4 oz (115.8 kg)   SpO2 96%   BMI 43.84 kg/m   Physical Exam  Constitutional:      General: Not in acute distress.    Appearance: Normal appearance. Not ill-appearing.  HENT:     Head: Normocephalic and atraumatic.  Neck:     Musculoskeletal: Normal range of motion.  Cardiovascular:     Rate and Rhythm: Normal rate Pulmonary:     Effort: Pulmonary effort is normal. No respiratory distress.  Musculoskeletal: Normal range of motion.  Skin:    General:  Skin is warm and dry.     Findings: No erythema or rash.  Neurological:     Mental Status: Alert and oriented to person, place, and time. Mental status is at baseline.  Psychiatric:        Mood and Affect: Mood normal.        Behavior: Behavior normal.    Assessment/Plan: The patient is scheduled for excision of left cheek lesion with Dr. Carolynne Citron.  Risks, benefits, and alternatives of procedure discussed, questions answered and consent obtained.    Smoking Status: Non-smoker; Counseling Given?  N/A  Caprini Score: 7; Risk Factors include: Age, BMI >40, swollen legs and length of planned surgery. Recommendation for mechanical and possible pharmacological prophylaxis. Encourage early ambulation.  Patient states that she did not need Lovenox for her previous surgery.  Recommended that she continue to ambulate regularly.  Will also discussed with Dr. Carolynne Citron.  Pictures obtained: @consult   Post-op Rx sent to pharmacy: Norco -patient states that her primary care provider prescribes her Norco for lupus flareups.  She states that she rarely has to take this.  I discussed with her that I will send in a few tablets of Norco for her, but discussed with her to not take this as the same time as any other pain pills and not to take at the same time as her Benadryl.  Patient expressed understanding.  Discussed with patient that she may take an NSAID first for her pain, and only take the Norco if pain persists.  Patient expressed understanding.  Patient states that she already has Zofran at home and does not need anymore.  Patient was provided with the General Surgical Risk consent document and Pain Medication Agreement prior to their appointment.  They had adequate time to read through the risk consent documents and Pain Medication Agreement. We also discussed them in person together during this preop appointment. All of their questions were answered to their satisfaction.  Recommended calling if they have any  further questions.  Risk consent form and Pain Medication Agreement to be scanned into patient's chart.  The consent was obtained with risks and complications reviewed which included bleeding, pain, scar, infection and the risk of anesthesia.  The patients questions were answered to the patients expressed satisfaction.  I discussed with the patient that if entirety of the squamous cell carcinoma is not removed, she would have to go back to surgery for further excision.  Patient expressed understanding.   Electronically signed by: Harden Leyden, PA-C 11/20/2023 9:55 AM

## 2023-11-20 NOTE — Progress Notes (Signed)
 Patient ID: Catherine Turner, female    DOB: [birth year], 68 y.o.   MRN: [id number]  Chief Complaint  Patient presents with   Pre-op Exam      ICD-10-CM   1. 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  female  with a history of squamous cell carcinoma.  She presents for preoperative evaluation for upcoming procedure, excision of left cheek lesion, scheduled for 12/04/2023 with Dr. Ladona Ridgel.  The patient has not had problems with anesthesia.  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  She denies any issues with her heart since then.  She states that she got clearance prior to her previous surgery.  She denies any chest pain or shortness of breath.  She denies taking any blood thinners.  Patient reports she is not a smoker.  Patient denies taking any birth control or hormone replacement.  She reports history of 1 miscarriage.  She denies any personal or family history of blood clots or clotting diseases.  She denies any recent surgeries, traumas, or infections.  She denies any history of stroke or heart attack.  She denies any history of Crohn's disease or ulcerative colitis.  She does report history of OSA, but does not use CPAP.  She does report history of asthma.  She does report history of skin cancer, otherwise denies any other history of cancer.  She denies any varicosities to her lower extremities, but she does report her legs do swell.  She denies any recent fevers, chills or changes in her health.  Patient does reports she has a latex allergy.  Summary of Previous Visit: Patient was most recently seen by Dr. Ladona Ridgel on 10/11/2023.  At this visit, patient was noted to have a new diagnosis of squamous cell carcinoma in situ on the left cheek.  She was referred for excision of the lesion as her dermatologist did not believe  topical therapies were appropriate due to patient's autoimmune disease.  Patient reported that she had the lesion since at least 2010 and it was treated by various dermatologists but never completely resolved.  On exam, there was a 1 x 1 cm area of discoloration.  Pathology showed atypia in the area.  Per chart review, surgical clearance was sent to patient's PCP at [facility] internal medicine.  Clearance was received back from PCP on 11/06/2023.  Per chart review, it was noted that patient was cleared for her surgery in September and that she was at low risk for the procedure.  Since then, patient denies any cardiac issues or changes in her health.  Job: Does not work at this time  PMH Significant for: Aortic arch anomaly, OSA, asthma, hypothyroidism, PTSD, anxiety, thoracic outlet syndrome, lupus   Past Medical History: Allergies: Allergies  Allergen Reactions   Advair Diskus [Fluticasone-Salmeterol] Other (See Comments)    Blood in urine   Ciprofloxacin Anaphylaxis   Clindamycin/Lincomycin Anaphylaxis   Escitalopram Anaphylaxis   Jasmine Fragrance Anaphylaxis   Beef-Derived Drug Products    Breo Ellipta [Fluticasone Furoate-Vilanterol] Other (See Comments)    Swelling of the abdomin.    Diclofenac Other (See Comments)    unknown   Furosemide Swelling   Gabapentin     depressed   Levothyroxine Other (See Comments)  Allergic to pork/tree pollens. Needs plant based thyroid medication.   Molds & Smuts Other (See Comments)    unknown   Peanuts [Peanut Oil]    Pork-Derived Products    Potassium-Containing Compounds Swelling   Pravastatin Other (See Comments)    unknown   Tilactase Other (See Comments)    unknown   Latex Rash   Sulfa Antibiotics Rash    Current Medications:  Current Outpatient Medications:    albuterol (PROVENTIL) (2.5 MG/3ML) 0.083% nebulizer solution, Take 3 mLs (2.5 mg total) by nebulization every 6 (six) hours as needed for wheezing., Disp: 75 mL, Rfl: prn    albuterol (VENTOLIN HFA) 108 (90 Base) MCG/ACT inhaler, Inhale 2 puffs into the lungs every 4 (four) hours as needed for wheezing., Disp: 1 each, Rfl: prn   diphenhydrAMINE (BENADRYL) 50 MG tablet, Take 50 mg by mouth at bedtime., Disp: , Rfl:    EPINEPHrine 0.3 mg/0.3 mL IJ SOAJ injection, Inject 0.3 mg into the muscle as needed for anaphylaxis., Disp: , Rfl:    HYDROcodone-acetaminophen (NORCO/VICODIN) 5-325 MG tablet, Take 1 tablet by mouth daily as needed for severe pain., Disp: , Rfl:    loratadine (CLARITIN) 10 MG tablet, Take 10 mg by mouth daily., Disp: , Rfl:    LUMIGAN 0.01 % SOLN, Place 1 drop into both eyes at bedtime., Disp: , Rfl:    Melatonin 10 MG TABS, Take 10 mg by mouth at bedtime., Disp: , Rfl:    naproxen sodium (ALEVE) 220 MG tablet, Take 220 mg by mouth daily as needed (pain)., Disp: , Rfl:    ondansetron (ZOFRAN) 4 MG tablet, Take 1 tablet (4 mg total) by mouth every 8 (eight) hours as needed for nausea or vomiting., Disp: 20 tablet, Rfl: 0   RESTASIS 0.05 % ophthalmic emulsion, Place 1 drop into both eyes 2 (two) times daily as needed (dry eyes)., Disp: , Rfl:    TIROSINT-SOL 100 MCG/ML SOLN, Take 90 mcg by mouth daily., Disp: , Rfl:    Ascorbic Acid (VITAMIN C) 500 MG CAPS, Take 500 mg by mouth daily. (Patient not taking: Reported on 10/11/2023), Disp: , Rfl:    cholecalciferol (VITAMIN D3) 25 MCG (1000 UNIT) tablet, Take 1,000 Units by mouth daily. (Patient not taking: Reported on 10/11/2023), Disp: , Rfl:   Past Medical Problems: Past Medical History:  Diagnosis Date   Angio-edema    Arm fracture    right arm    Asthma    Bulging lumbar disc    Cancer (HCC)    basal cell carcinoma   Complication of anesthesia    pt sts, "I am a very shallow breather and they have to do something special for me"/   COPD (chronic obstructive pulmonary disease) (HCC)    Degenerative joint disease (DJD) of hip    DJD (degenerative joint disease) of hip    Eczema    Elevated LFTs     Hip pain    Hypothyroidism    Insomnia    Nerve damage    right arm.   PTSD (post-traumatic stress disorder)    Sjogren syndrome, unspecified (HCC)    Sleep apnea    has CPAP   Thyroid disease    Urticaria     Past Surgical History: Past Surgical History:  Procedure Laterality Date   COLONOSCOPY N/A 08/12/2014   Procedure: COLONOSCOPY;  Surgeon: Suzette Espy, MD;  Location: [facility];  Service: Endoscopy;  Laterality: N/A;  115   LESION REMOVAL Right  04/23/2023   Procedure: EXCISION OF RIGHT UPPER ARM BASAL CELL CARCINOMA;  Surgeon: Teretha Ferguson, MD;  Location: [facility] OR;  Service: Plastics;  Laterality: Right;   PUNCH BIOPSY OF SKIN Left 04/23/2023   Procedure: EXCISION OF LEFT FOOT LESION;  Surgeon: Teretha Ferguson, MD;  Location: [facility] OR;  Service: Plastics;  Laterality: Left;   thumb surgery     TOOTH EXTRACTION  04/2022   UMBILICAL HERNIA REPAIR N/A 12/30/2014   Procedure: HERNIA REPAIR UMBILICAL ADULT WITH MESH;  Surgeon: Alanda Allegra Md, MD;  Location: [facility];  Service: General;  Laterality: N/A;    Social History: Social History   Socioeconomic History   Marital status: Single    Spouse name: Not on file   Number of children: Not on file   Years of education: Not on file   Highest education level: Not on file  Occupational History   Not on file  Tobacco Use   Smoking status: Never    Passive exposure: Yes   Smokeless tobacco: Never  Vaping Use   Vaping status: Never Used  Substance and Sexual Activity   Alcohol use: Yes    Comment: 4 oz wine    Drug use: No   Sexual activity: Never    Birth control/protection: None  Other Topics Concern   Not on file  Social History Narrative   Not on file   Social Drivers of Health   Financial Resource Strain: Not on file  Food Insecurity: Not on file  Transportation Needs: Not on file  Physical Activity: Insufficiently Active (01/22/2023)   Received from [facility], [facility]   Exercise Vital  Sign    Days of Exercise per Week: 7 days    Minutes of Exercise per Session: 20 min  Stress: Not on file  Social Connections: Not on file  Intimate Partner Violence: Not At Risk (01/22/2023)   Received from [facility], [facility]   Humiliation, Afraid, Rape, and Kick questionnaire    Fear of Current or Ex-Partner: No    Emotionally Abused: No    Physically Abused: No    Sexually Abused: No    Family History: Family History  Problem Relation Age of Onset   COPD Mother    Cancer - Lung Mother    Cancer Father    Hypertension Father    Drug abuse Sister    Dementia Maternal Aunt    Alcohol abuse Paternal Aunt    Alcohol abuse Paternal Uncle    Heart disease Maternal Grandmother    Heart disease Maternal Grandfather    Heart disease Paternal Grandmother    Heart disease Paternal Grandfather    Heart attack Paternal Grandfather    Physical abuse Other    Anxiety disorder Other    Birth defects Other        Downs Synddrome   ADD / ADHD Neg Hx    Bipolar disorder Neg Hx    OCD Neg Hx    Paranoid behavior Neg Hx    Schizophrenia Neg Hx    Seizures Neg Hx    Sexual abuse Neg Hx    Allergic rhinitis Neg Hx    Asthma Neg Hx    Eczema Neg Hx    Urticaria Neg Hx    Breast cancer Neg Hx     Review of Systems: Denies any recent fevers, chills or changes in her health  Physical Exam: Vital Signs BP (!) 135/59 (BP Location: Left  Arm, Patient Position: Sitting, Cuff Size: Large)   Pulse 74   Wt 255 lb 6.4 oz (115.8 kg)   SpO2 96%   BMI 43.84 kg/m   Physical Exam  Constitutional:      General: Not in acute distress.    Appearance: Normal appearance. Not ill-appearing.  HENT:     Head: Normocephalic and atraumatic.  Neck:     Musculoskeletal: Normal range of motion.  Cardiovascular:     Rate and Rhythm: Normal rate Pulmonary:     Effort: Pulmonary effort is normal. No respiratory distress.  Musculoskeletal: Normal range of motion.  Skin:    General:  Skin is warm and dry.     Findings: No erythema or rash.  Neurological:     Mental Status: Alert and oriented to person, place, and time. Mental status is at baseline.  Psychiatric:        Mood and Affect: Mood normal.        Behavior: Behavior normal.    Assessment/Plan: The patient is scheduled for excision of left cheek lesion with Dr. Carolynne Citron.  Risks, benefits, and alternatives of procedure discussed, questions answered and consent obtained.    Smoking Status: Non-smoker; Counseling Given?  N/A  Caprini Score: 7; Risk Factors include: Age, BMI >40, swollen legs and length of planned surgery. Recommendation for mechanical and possible pharmacological prophylaxis. Encourage early ambulation.  Patient states that she did not need Lovenox for her previous surgery.  Recommended that she continue to ambulate regularly.  Will also discussed with Dr. Carolynne Citron.  Pictures obtained: @consult   Post-op Rx sent to pharmacy: Norco -patient states that her primary care provider prescribes her Norco for lupus flareups.  She states that she rarely has to take this.  I discussed with her that I will send in a few tablets of Norco for her, but discussed with her to not take this as the same time as any other pain pills and not to take at the same time as her Benadryl.  Patient expressed understanding.  Discussed with patient that she may take an NSAID first for her pain, and only take the Norco if pain persists.  Patient expressed understanding.  Patient states that she already has Zofran at home and does not need anymore.  Patient was provided with the General Surgical Risk consent document and Pain Medication Agreement prior to their appointment.  They had adequate time to read through the risk consent documents and Pain Medication Agreement. We also discussed them in person together during this preop appointment. All of their questions were answered to their satisfaction.  Recommended calling if they have any  further questions.  Risk consent form and Pain Medication Agreement to be scanned into patient's chart.  The consent was obtained with risks and complications reviewed which included bleeding, pain, scar, infection and the risk of anesthesia.  The patients questions were answered to the patients expressed satisfaction.  I discussed with the patient that if entirety of the squamous cell carcinoma is not removed, she would have to go back to surgery for further excision.  Patient expressed understanding.   Electronically signed by: Harden Leyden, PA-C 11/20/2023 9:55 AM

## 2023-12-04 ENCOUNTER — Ambulatory Visit (HOSPITAL_BASED_OUTPATIENT_CLINIC_OR_DEPARTMENT_OTHER): Admitting: Certified Registered Nurse Anesthetist

## 2023-12-04 ENCOUNTER — Other Ambulatory Visit: Payer: Self-pay

## 2023-12-04 ENCOUNTER — Encounter (HOSPITAL_BASED_OUTPATIENT_CLINIC_OR_DEPARTMENT_OTHER): Payer: Self-pay | Admitting: Plastic Surgery

## 2023-12-04 ENCOUNTER — Ambulatory Visit (HOSPITAL_BASED_OUTPATIENT_CLINIC_OR_DEPARTMENT_OTHER)
Admission: RE | Admit: 2023-12-04 | Discharge: 2023-12-04 | Disposition: A | Discharge status: Home / Self Care | Attending: Plastic Surgery | Admitting: Plastic Surgery

## 2023-12-04 ENCOUNTER — Encounter (HOSPITAL_BASED_OUTPATIENT_CLINIC_OR_DEPARTMENT_OTHER)
Admission: RE | Disposition: A | Discharge status: Home / Self Care | Payer: Self-pay | Source: Home / Self Care | Attending: Plastic Surgery

## 2023-12-04 DIAGNOSIS — E039 Hypothyroidism, unspecified: Secondary | ICD-10-CM | POA: Insufficient documentation

## 2023-12-04 DIAGNOSIS — Z85828 Personal history of other malignant neoplasm of skin: Secondary | ICD-10-CM | POA: Insufficient documentation

## 2023-12-04 DIAGNOSIS — J4489 Other specified chronic obstructive pulmonary disease: Secondary | ICD-10-CM | POA: Insufficient documentation

## 2023-12-04 DIAGNOSIS — F418 Other specified anxiety disorders: Secondary | ICD-10-CM

## 2023-12-04 DIAGNOSIS — D0439 Carcinoma in situ of skin of other parts of face: Secondary | ICD-10-CM | POA: Diagnosis not present

## 2023-12-04 DIAGNOSIS — J449 Chronic obstructive pulmonary disease, unspecified: Secondary | ICD-10-CM | POA: Diagnosis not present

## 2023-12-04 DIAGNOSIS — G4733 Obstructive sleep apnea (adult) (pediatric): Secondary | ICD-10-CM | POA: Diagnosis not present

## 2023-12-04 DIAGNOSIS — Z6841 Body Mass Index (BMI) 40.0 and over, adult: Secondary | ICD-10-CM | POA: Insufficient documentation

## 2023-12-04 DIAGNOSIS — M1990 Unspecified osteoarthritis, unspecified site: Secondary | ICD-10-CM | POA: Insufficient documentation

## 2023-12-04 DIAGNOSIS — Z01818 Encounter for other preprocedural examination: Secondary | ICD-10-CM

## 2023-12-04 DIAGNOSIS — L989 Disorder of the skin and subcutaneous tissue, unspecified: Secondary | ICD-10-CM | POA: Diagnosis present

## 2023-12-04 DIAGNOSIS — Z91040 Latex allergy status: Secondary | ICD-10-CM | POA: Insufficient documentation

## 2023-12-04 DIAGNOSIS — Q254 Congenital malformation of aorta unspecified: Secondary | ICD-10-CM | POA: Diagnosis not present

## 2023-12-04 DIAGNOSIS — E66813 Obesity, class 3: Secondary | ICD-10-CM | POA: Insufficient documentation

## 2023-12-04 DIAGNOSIS — M329 Systemic lupus erythematosus, unspecified: Secondary | ICD-10-CM | POA: Diagnosis not present

## 2023-12-04 DIAGNOSIS — C44329 Squamous cell carcinoma of skin of other parts of face: Secondary | ICD-10-CM | POA: Diagnosis not present

## 2023-12-04 HISTORY — PX: EXCISION MASS HEAD: SHX6702

## 2023-12-04 HISTORY — DX: Systemic lupus erythematosus, unspecified: M32.9

## 2023-12-04 SURGERY — EXCISION, MASS, HEAD
Anesthesia: General | Site: Face | Laterality: Left

## 2023-12-04 MED ORDER — FENTANYL CITRATE (PF) 100 MCG/2ML IJ SOLN
INTRAMUSCULAR | Status: DC | PRN
  Administered 2023-12-04: 100 ug via INTRAVENOUS

## 2023-12-04 MED ORDER — BUPIVACAINE-EPINEPHRINE (PF) 0.25% -1:200000 IJ SOLN
INTRAMUSCULAR | Status: AC
  Filled 2023-12-04: qty 30

## 2023-12-04 MED ORDER — HYDROMORPHONE HCL 1 MG/ML IJ SOLN: 0.2500 mg | INTRAMUSCULAR | Status: DC | PRN

## 2023-12-04 MED ORDER — DEXAMETHASONE SODIUM PHOSPHATE 10 MG/ML IJ SOLN
INTRAMUSCULAR | Status: AC
  Filled 2023-12-04: qty 1

## 2023-12-04 MED ORDER — PROPOFOL 500 MG/50ML IV EMUL
INTRAVENOUS | Status: AC
  Filled 2023-12-04: qty 50

## 2023-12-04 MED ORDER — OXYCODONE HCL 5 MG PO TABS: 5.0000 mg | ORAL_TABLET | Freq: Once | ORAL | Status: DC | PRN

## 2023-12-04 MED ORDER — CEFAZOLIN SODIUM-DEXTROSE 2-4 GM/100ML-% IV SOLN
INTRAVENOUS | Status: AC
  Filled 2023-12-04: qty 100

## 2023-12-04 MED ORDER — CHLORHEXIDINE GLUCONATE CLOTH 2 % EX PADS: 6.0000 | MEDICATED_PAD | Freq: Once | CUTANEOUS | Status: DC

## 2023-12-04 MED ORDER — LACTATED RINGERS IV SOLN: INTRAVENOUS | Status: DC

## 2023-12-04 MED ORDER — MEPERIDINE HCL 25 MG/ML IJ SOLN: 6.2500 mg | INTRAMUSCULAR | Status: DC | PRN

## 2023-12-04 MED ORDER — WHITE PETROLATUM EX OINT
TOPICAL_OINTMENT | CUTANEOUS | Status: DC | PRN
  Administered 2023-12-04: 1 via TOPICAL

## 2023-12-04 MED ORDER — CHLORHEXIDINE GLUCONATE CLOTH 2 % EX PADS
6.0000 | MEDICATED_PAD | Freq: Once | CUTANEOUS | Status: AC
  Administered 2023-12-04: 6 via TOPICAL

## 2023-12-04 MED ORDER — PHENYLEPHRINE 80 MCG/ML (10ML) SYRINGE FOR IV PUSH (FOR BLOOD PRESSURE SUPPORT)
PREFILLED_SYRINGE | INTRAVENOUS | Status: AC
  Filled 2023-12-04: qty 10

## 2023-12-04 MED ORDER — DEXAMETHASONE SODIUM PHOSPHATE 10 MG/ML IJ SOLN
INTRAMUSCULAR | Status: DC | PRN
  Administered 2023-12-04: 4 mg via INTRAVENOUS

## 2023-12-04 MED ORDER — MIDAZOLAM HCL 2 MG/2ML IJ SOLN
INTRAMUSCULAR | Status: DC | PRN
  Administered 2023-12-04: 2 mg via INTRAVENOUS

## 2023-12-04 MED ORDER — 0.9 % SODIUM CHLORIDE (POUR BTL) OPTIME
TOPICAL | Status: DC | PRN
  Administered 2023-12-04: 1000 mL

## 2023-12-04 MED ORDER — ONDANSETRON HCL 4 MG/2ML IJ SOLN
INTRAMUSCULAR | Status: AC
  Filled 2023-12-04: qty 6

## 2023-12-04 MED ORDER — ONDANSETRON HCL 4 MG/2ML IJ SOLN
INTRAMUSCULAR | Status: DC | PRN
  Administered 2023-12-04: 4 mg via INTRAVENOUS

## 2023-12-04 MED ORDER — BUPIVACAINE-EPINEPHRINE 0.25% -1:200000 IJ SOLN
INTRAMUSCULAR | Status: DC | PRN
  Administered 2023-12-04: 8 mL

## 2023-12-04 MED ORDER — AMISULPRIDE (ANTIEMETIC) 5 MG/2ML IV SOLN: 10.0000 mg | Freq: Once | INTRAVENOUS | Status: DC | PRN

## 2023-12-04 MED ORDER — ROCURONIUM BROMIDE 10 MG/ML (PF) SYRINGE
PREFILLED_SYRINGE | INTRAVENOUS | Status: AC
  Filled 2023-12-04: qty 10

## 2023-12-04 MED ORDER — FENTANYL CITRATE (PF) 100 MCG/2ML IJ SOLN
INTRAMUSCULAR | Status: AC
  Filled 2023-12-04: qty 2

## 2023-12-04 MED ORDER — ONDANSETRON HCL 4 MG/2ML IJ SOLN
INTRAMUSCULAR | Status: AC
  Filled 2023-12-04: qty 2

## 2023-12-04 MED ORDER — WHITE PETROLATUM EX OINT
TOPICAL_OINTMENT | CUTANEOUS | Status: AC
  Filled 2023-12-04: qty 28.35

## 2023-12-04 MED ORDER — SODIUM CHLORIDE 0.9 % IV SOLN: 12.5000 mg | INTRAVENOUS | Status: DC | PRN

## 2023-12-04 MED ORDER — LIDOCAINE 2% (20 MG/ML) 5 ML SYRINGE
INTRAMUSCULAR | Status: DC | PRN
  Administered 2023-12-04: 40 mg via INTRAVENOUS

## 2023-12-04 MED ORDER — MIDAZOLAM HCL 2 MG/2ML IJ SOLN
INTRAMUSCULAR | Status: AC
  Filled 2023-12-04: qty 2

## 2023-12-04 MED ORDER — LIDOCAINE 2% (20 MG/ML) 5 ML SYRINGE
INTRAMUSCULAR | Status: AC
  Filled 2023-12-04: qty 15

## 2023-12-04 MED ORDER — OXYCODONE HCL 5 MG/5ML PO SOLN: 5.0000 mg | Freq: Once | ORAL | Status: DC | PRN

## 2023-12-04 MED ORDER — DEXAMETHASONE SODIUM PHOSPHATE 10 MG/ML IJ SOLN
INTRAMUSCULAR | Status: AC
  Filled 2023-12-04: qty 2

## 2023-12-04 MED ORDER — CEFAZOLIN SODIUM-DEXTROSE 2-3 GM-%(50ML) IV SOLR
INTRAVENOUS | Status: DC | PRN
  Administered 2023-12-04: 2 g via INTRAVENOUS

## 2023-12-04 MED ORDER — CEFAZOLIN SODIUM-DEXTROSE 2-4 GM/100ML-% IV SOLN: 2.0000 g | INTRAVENOUS | Status: DC

## 2023-12-04 MED ORDER — PROPOFOL 10 MG/ML IV BOLUS
INTRAVENOUS | Status: DC | PRN
  Administered 2023-12-04: 200 ug via INTRAVENOUS

## 2023-12-04 MED ORDER — EPHEDRINE 5 MG/ML INJ
INTRAVENOUS | Status: AC
  Filled 2023-12-04: qty 5

## 2023-12-04 SURGICAL SUPPLY — 74 items
BAND RUBBER #18 3X1/16 STRL (MISCELLANEOUS) IMPLANT
BENZOIN TINCTURE PRP APPL 2/3 (GAUZE/BANDAGES/DRESSINGS) IMPLANT
BLADE CLIPPER SURG (BLADE) IMPLANT
BLADE SURG 15 STRL LF DISP TIS (BLADE) ×1 IMPLANT
CANISTER SUCT 1200ML W/VALVE (MISCELLANEOUS) IMPLANT
CHLORAPREP W/TINT 26 (MISCELLANEOUS) ×1 IMPLANT
COVER BACK TABLE 60X90IN (DRAPES) ×1 IMPLANT
COVER MAYO STAND STRL (DRAPES) ×1 IMPLANT
DERMABOND ADVANCED .7 DNX12 (GAUZE/BANDAGES/DRESSINGS) IMPLANT
DRAIN WOUND RND W/TROCAR (DRAIN) IMPLANT
DRAPE LAPAROTOMY 100X72 PEDS (DRAPES) IMPLANT
DRAPE U-SHAPE 76X120 STRL (DRAPES) IMPLANT
DRAPE UTILITY XL STRL (DRAPES) ×1 IMPLANT
DRSG TELFA 3X8 NADH STRL (GAUZE/BANDAGES/DRESSINGS) IMPLANT
ELECT COATED BLADE 2.86 ST (ELECTRODE) IMPLANT
ELECT NDL BLADE 2-5/6 (NEEDLE) ×1 IMPLANT
ELECT NEEDLE BLADE 2-5/6 (NEEDLE) ×1 IMPLANT
ELECTRODE REM PT RETRN 9FT PED (ELECTROSURGICAL) IMPLANT
ELECTRODE REM PT RTRN 9FT ADLT (ELECTROSURGICAL) IMPLANT
EVACUATOR SILICONE 100CC (DRAIN) IMPLANT
GAUZE SPONGE 2X2 STRL 8-PLY (GAUZE/BANDAGES/DRESSINGS) IMPLANT
GAUZE SPONGE 4X4 12PLY STRL LF (GAUZE/BANDAGES/DRESSINGS) IMPLANT
GAUZE XEROFORM 1X8 LF (GAUZE/BANDAGES/DRESSINGS) IMPLANT
GLOVE BIO SURGEON STRL SZ 6.5 (GLOVE) IMPLANT
GLOVE BIO SURGEON STRL SZ7.5 (GLOVE) IMPLANT
GLOVE BIO SURGEON STRL SZ8 (GLOVE) ×1 IMPLANT
GLOVE BIOGEL PI IND STRL 7.0 (GLOVE) IMPLANT
GLOVE BIOGEL PI IND STRL 8 (GLOVE) IMPLANT
GLOVE SURG SS PI 7.5 STRL IVOR (GLOVE) IMPLANT
GLOVE SURG SYN 8.0 (GLOVE) ×1 IMPLANT
GLOVE SURG SYN 8.0 PF PI (GLOVE) IMPLANT
GOWN STRL REUS W/ TWL LRG LVL3 (GOWN DISPOSABLE) ×1 IMPLANT
GOWN STRL REUS W/ TWL XL LVL3 (GOWN DISPOSABLE) IMPLANT
GOWN STRL REUS W/TWL XL LVL3 (GOWN DISPOSABLE) ×1 IMPLANT
HIBICLENS CHG 4% 4OZ BTL (MISCELLANEOUS) ×1 IMPLANT
MARKER SKIN DUAL TIP RULER LAB (MISCELLANEOUS) IMPLANT
NDL FILTER BLUNT 18X1 1/2 (NEEDLE) IMPLANT
NDL HYPO 30GX1 BEV (NEEDLE) IMPLANT
NDL PRECISIONGLIDE 27X1.5 (NEEDLE) ×1 IMPLANT
NDL SAFETY ECLIPSE 18X1.5 (NEEDLE) IMPLANT
NEEDLE FILTER BLUNT 18X1 1/2 (NEEDLE) IMPLANT
NEEDLE HYPO 30GX1 BEV (NEEDLE) IMPLANT
NEEDLE PRECISIONGLIDE 27X1.5 (NEEDLE) ×1 IMPLANT
NS IRRIG 1000ML POUR BTL (IV SOLUTION) IMPLANT
PACK BASIN DAY SURGERY FS (CUSTOM PROCEDURE TRAY) ×1 IMPLANT
PACK UNIVERSAL I (CUSTOM PROCEDURE TRAY) IMPLANT
PENCIL SMOKE EVACUATOR (MISCELLANEOUS) ×1 IMPLANT
SHEET MEDIUM DRAPE 40X70 STRL (DRAPES) IMPLANT
SLEEVE SCD COMPRESS KNEE MED (STOCKING) IMPLANT
SPONGE T-LAP 18X18 ~~LOC~~+RFID (SPONGE) IMPLANT
STAPLER SKIN PROX WIDE 3.9 (STAPLE) ×1 IMPLANT
STRIP CLOSURE SKIN 1/2X4 (GAUZE/BANDAGES/DRESSINGS) IMPLANT
SUCTION TUBE FRAZIER 10FR DISP (SUCTIONS) IMPLANT
SUT ETHILON 4 0 PS 2 18 (SUTURE) IMPLANT
SUT MNCRL AB 3-0 PS2 27 (SUTURE) IMPLANT
SUT MNCRL AB 4-0 PS2 18 (SUTURE) IMPLANT
SUT MON AB 5-0 P3 18 (SUTURE) IMPLANT
SUT PDS 3-0 CT2 (SUTURE) IMPLANT
SUT PDS II 3-0 CT2 27 ABS (SUTURE) IMPLANT
SUT PROLENE 5 0 P 3 (SUTURE) IMPLANT
SUT SILK 3 0 SH 30 (SUTURE) IMPLANT
SUT STRATA 3-0 60 PS-1 (SUTURE) IMPLANT
SUT VIC AB 3-0 SH 27X BRD (SUTURE) IMPLANT
SUT VIC AB 4-0 PS2 18 (SUTURE) IMPLANT
SUT VICRYL RAPIDE 4-0 (SUTURE) IMPLANT
SWAB COLLECTION DEVICE MRSA (MISCELLANEOUS) IMPLANT
SWAB CULTURE ESWAB REG 1ML (MISCELLANEOUS) IMPLANT
SYR 50ML LL SCALE MARK (SYRINGE) IMPLANT
SYR BULB EAR ULCER 3OZ GRN STR (SYRINGE) IMPLANT
SYR CONTROL 10ML LL (SYRINGE) ×1 IMPLANT
TOWEL GREEN STERILE FF (TOWEL DISPOSABLE) ×1 IMPLANT
TRAY DSU PREP LF (CUSTOM PROCEDURE TRAY) IMPLANT
TUBE CONNECTING 20X1/4 (TUBING) IMPLANT
YANKAUER SUCT BULB TIP NO VENT (SUCTIONS) IMPLANT

## 2023-12-04 NOTE — Anesthesia Postprocedure Evaluation (Signed)
 Anesthesia Post Note  Patient: Catherine Turner  Procedure(s) Performed: EXCISION, MASS, HEAD (Left: Face)     Patient location during evaluation: PACU Anesthesia Type: General Level of consciousness: awake and alert Pain management: pain level controlled Vital Signs Assessment: post-procedure vital signs reviewed and stable Respiratory status: spontaneous breathing, nonlabored ventilation and respiratory function stable Cardiovascular status: blood pressure returned to baseline and stable Postop Assessment: no apparent nausea or vomiting Anesthetic complications: no   No notable events documented.  Last Vitals:  Vitals:   12/04/23 1600 12/04/23 1617  BP: (!) 126/109 139/60  Pulse: 66 64  Resp: 16 14  Temp:  (!) 36.3 C  SpO2: 97% 97%    Last Pain:  Vitals:   12/04/23 1617  TempSrc: Temporal  PainSc: 2                  Earvin Goldberg

## 2023-12-04 NOTE — Anesthesia Preprocedure Evaluation (Signed)
 Anesthesia Evaluation  Patient identified by MRN, date of birth, ID band Patient awake    Reviewed: Allergy  & Precautions, NPO status , Patient's Chart, lab work & pertinent test results  History of Anesthesia Complications Negative for: history of anesthetic complications  Airway Mallampati: III  TM Distance: >3 FB Neck ROM: Full    Dental  (+) Poor Dentition, Dental Advisory Given   Pulmonary shortness of breath, asthma , sleep apnea , COPD   breath sounds clear to auscultation       Cardiovascular negative cardio ROS  Rhythm:Regular     Neuro/Psych  PSYCHIATRIC DISORDERS Anxiety Depression    negative neurological ROS     GI/Hepatic negative GI ROS, Neg liver ROS,,,  Endo/Other    Class 3 obesity  Renal/GU negative Renal ROS     Musculoskeletal  (+) Arthritis ,    Abdominal  (+) + obese  Peds  Hematology negative hematology ROS (+) Lab Results      Component                Value               Date                      WBC                      6.9                 04/17/2023                HGB                      14.7                04/17/2023                HCT                      45.5                04/17/2023                MCV                      89.2                04/17/2023                PLT                      171                 04/17/2023              Anesthesia Other Findings   Reproductive/Obstetrics                             Anesthesia Physical Anesthesia Plan  ASA: 3  Anesthesia Plan: General   Post-op Pain Management: Minimal or no pain anticipated   Induction: Intravenous  PONV Risk Score and Plan: 3 and Ondansetron , Dexamethasone, Midazolam  and Treatment may vary due to age or medical condition  Airway Management Planned: LMA  Additional Equipment: None  Intra-op Plan:   Post-operative Plan:   Informed Consent: I have reviewed the patients  History and  Physical, chart, labs and discussed the procedure including the risks, benefits and alternatives for the proposed anesthesia with the patient or authorized representative who has indicated his/her understanding and acceptance.     Dental advisory given  Plan Discussed with: CRNA  Anesthesia Plan Comments: (PAT note by Rudy Costain, PA-C: Patient is a 68 year old female with pertinent history including OSA not on CPAP, Sjogren's syndrome, COPD, asthma, hypothyroid, morbid obesity BMI 45.  Recently evaluated by cardiology for history of bovine aortic arch per CT imaging in 2015.  Seen by Dr. Mallipeddi on 03/06/2023 for evaluation.  CTA was ordered for further evaluation.  CTA 03/13/2023 showed no thoracic aortic aneurysm, bovine arch configuration.  Dr. Mallipeddi subsequently cleared the patient as low risk for surgery.  History of low risk nuclear stress test done 08/29/2021 at [facility] .  Patient reports she has previously been told she has a "shallow breather".  Consider obesity hypoventilation syndrome.  Preop labs reviewed, unremarkable.  EKG 03/06/2023: Sinus rhythm with premature atrial complexes.  Rate 76.  Nuclear stress test 08/29/2021 (Care Everywhere): 1. No reversible ischemia or infarction. Motion degradation of the  stress imaging.   2. Normal left ventricular wall motion.   3. Left ventricular ejection fraction 64%   4. Non invasive risk stratification*: Low    )        Anesthesia Quick Evaluation

## 2023-12-04 NOTE — Transfer of Care (Signed)
 Immediate Anesthesia Transfer of Care Note  Patient: Catherine Turner  Procedure(s) Performed: EXCISION, MASS, HEAD (Left: Face)  Patient Location: PACU  Anesthesia Type:General  Level of Consciousness: awake and patient cooperative  Airway & Oxygen Therapy: Patient Spontanous Breathing and Patient connected to face mask oxygen  Post-op Assessment: Report given to RN and Post -op Vital signs reviewed and stable  Post vital signs: Reviewed and stable  Last Vitals:  Vitals Value Taken Time  BP 142/65 12/04/23 1528  Temp    Pulse 75 12/04/23 1530  Resp 15 12/04/23 1530  SpO2 99 % 12/04/23 1530  Vitals shown include unfiled device data.  Last Pain:  Vitals:   12/04/23 1114  TempSrc: Temporal  PainSc: 3       Patients Stated Pain Goal: 3 (12/04/23 1114)  Complications: No notable events documented.

## 2023-12-04 NOTE — Op Note (Signed)
 DATE OF OPERATION: 12/04/2023  LOCATION: [facility] operating Room  PREOPERATIVE DIAGNOSIS: Left cheek skin lesion  POSTOPERATIVE DIAGNOSIS: Same  PROCEDURE: Excision of left cheek skin lesion  SURGEON: Kurtis Philips, MD  ASSISTANT: Matt Scheeler  EBL: 20 cc  CONDITION: Stable  COMPLICATIONS: None  INDICATION: The patient, Catherine Turner, is a 68 y.o. female born on [birth year], is here for excision of a skin lesion which been biopsied by her dermatologist and shown to be squamous cell carcinoma in situ.  Patient has had this lesion treated for many years prior to having a biopsy.Aaron Aas   PROCEDURE DETAILS:  The patient was seen prior to surgery and marked.  IV antibiotics were given. The patient was taken to the operating room and given a general anesthetic. A standard time out was performed and all information was confirmed by those in the room. SCDs were placed.   The left cheek was prepped and draped in usual sterile manner.  An elliptical incision was made around the skin lesion and the lesion was excised with full-thickness dermis and a minimal amount of subcutaneous fat.  Silk sutures were used to mark the orientation with a short suture marking the superior border and a long suture marking the lateral border.  The defect after excision measured 3.5 x 2 cm.  Extensive undermining of the inferior skin flap was performed sharply with undermining of approximately 2 cm in all directions.  The superior skin flap was not undermined to prevent possible ectropion.  The deep tissues and dermis were closed with interrupted 4-0 Monocryl sutures and the skin was closed with interrupted 5-0 Prolene sutures.  A sterile dressing was applied and the patient was awakened from anesthesia without incident.  She was transferred to the recovery room in good condition.  All instrument needle and sponge counts were reported as correct and there were no complications appreciated during the procedure. The patient  was allowed to wake up and taken to recovery room in stable condition at the end of the case. The family was notified at the end of the case.   The advanced practice practitioner (APP) assisted throughout the case.  The APP was essential in retraction and counter traction when needed to make the case progress smoothly.  This retraction and assistance made it possible to see the tissue plans for the procedure.  The assistance was needed for blood control, tissue re-approximation and assisted with closure of the incision site.

## 2023-12-04 NOTE — Discharge Instructions (Addendum)
 Activity as tolerated. NO showers for 24 hours NO driving for 24 hours No heavy activities  Diet: Regular, Try to optimize nutrition with plenty of fruits and vegetables to improve healing. Wound Care: Keep dressing clean & dry. Change as needed.  Call doctor if any unusual problems occur such as pain, excessive bleeding, unrelieved nausea/vomiting, fever &/or chills  Follow-up appointment: Previously scheduled.   Post Anesthesia Home Care Instructions  Activity: Get plenty of rest for the remainder of the day. A responsible individual must stay with you for 24 hours following the procedure.  For the next 24 hours, DO NOT: -Drive a car -Advertising copywriter -Drink alcoholic beverages -Take any medication unless instructed by your physician -Make any legal decisions or sign important papers.  Meals: Start with liquid foods such as gelatin or soup. Progress to regular foods as tolerated. Avoid greasy, spicy, heavy foods. If nausea and/or vomiting occur, drink only clear liquids until the nausea and/or vomiting subsides. Call your physician if vomiting continues.  Special Instructions/Symptoms: Your throat may feel dry or sore from the anesthesia or the breathing tube placed in your throat during surgery. If this causes discomfort, gargle with warm salt water . The discomfort should disappear within 24 hours.  If you had a scopolamine patch placed behind your ear for the management of post- operative nausea and/or vomiting:  1. The medication in the patch is effective for 72 hours, after which it should be removed.  Wrap patch in a tissue and discard in the trash. Wash hands thoroughly with soap and water . 2. You may remove the patch earlier than 72 hours if you experience unpleasant side effects which may include dry mouth, dizziness or visual disturbances. 3. Avoid touching the patch. Wash your hands with soap and water  after contact with the patch.

## 2023-12-04 NOTE — Interval H&P Note (Signed)
 History and Physical Interval Note: No change in exam or indication for surgery Site marked with her concurrence All questions answered. Will proceed at her request  12/04/2023 11:59 AM  Catherine Turner  has presented today for surgery, with the diagnosis of Skin lesion.  The various methods of treatment have been discussed with the patient and family. After consideration of risks, benefits and other options for treatment, the patient has consented to  Procedure(s): EXCISION, MASS, HEAD (Left) as a surgical intervention.  The patient's history has been reviewed, patient examined, no change in status, stable for surgery.  I have reviewed the patient's chart and labs.  Questions were answered to the patient's satisfaction.     Teretha Ferguson

## 2023-12-04 NOTE — Anesthesia Procedure Notes (Signed)
 Procedure Name: LMA Insertion Date/Time: 12/04/2023 2:27 PM  Performed by: Steffani Edman, CRNAPre-anesthesia Checklist: Patient identified, Emergency Drugs available, Suction available and Patient being monitored Patient Re-evaluated:Patient Re-evaluated prior to induction Oxygen Delivery Method: Circle System Utilized Preoxygenation: Pre-oxygenation with 100% oxygen Induction Type: IV induction Ventilation: Mask ventilation without difficulty LMA: LMA inserted LMA Size: 4.0 Number of attempts: 1 Placement Confirmation: positive ETCO2 Tube secured with: Tape Dental Injury: Teeth and Oropharynx as per pre-operative assessment

## 2023-12-07 ENCOUNTER — Telehealth: Payer: Self-pay

## 2023-12-07 NOTE — Telephone Encounter (Signed)
 Patient left a voicemail stating she has some questions about what to put on her surgical incision. She stated she took the bandaid off and there was a small portion that looked open. When I called the patient back there was no answer, so I told her to call/send a myChart message and we will get back to her. I was going to tell her she can put some vaseline on the area and keep it covered if that is more comfortable for her.

## 2023-12-10 ENCOUNTER — Ambulatory Visit (INDEPENDENT_AMBULATORY_CARE_PROVIDER_SITE_OTHER): Admitting: Plastic Surgery

## 2023-12-10 ENCOUNTER — Encounter: Payer: Self-pay | Admitting: Plastic Surgery

## 2023-12-10 VITALS — BP 124/74 | HR 69 | Ht 64.0 in | Wt 259.0 lb

## 2023-12-10 DIAGNOSIS — Z9889 Other specified postprocedural states: Secondary | ICD-10-CM

## 2023-12-10 DIAGNOSIS — L989 Disorder of the skin and subcutaneous tissue, unspecified: Secondary | ICD-10-CM

## 2023-12-10 NOTE — Progress Notes (Signed)
 Catherine Turner returns today 6 days postop from excision of a skin lesion on her left cheek.  She has no complaints.  We discussed the procedure at length.  I told her that the pathology had still not returned.  The sutures were removed without difficulty.  She will begin scar massage at 2 weeks postop.  She may keep Vaseline on until then.  We discussed the fact that if her pathology returns with any positive margins that she will still need to see a Mohs surgeon.  I have asked her to return next Monday so that we can ensure she is seen and the appropriate follow-up is arranged.

## 2023-12-11 LAB — SURGICAL PATHOLOGY

## 2023-12-13 NOTE — Progress Notes (Signed)
 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 Carolynne Citron who presents to clinic for postoperative follow-up.  Pathology showed residual squamous cell carcinoma with free margins.  She did have a early actinic keratosis involving the peripheral margin, but no carcinoma present at the margin.  Patient reports she is doing well.  She is aware of the pathology and was able to read it via MyChart.. Patient expresses concerns over receiving fentanyl  intraoperatively and taking Benadryl postoperatively and preoperatively.  She reports she takes Benadryl regularly for itching at night.  She has noticed that when she takes the Benadryl she has some swelling of her lips and heart palpitations.  She reports that these have resolved and she does not have any currently.  She does report that she has noticed it when taking Benadryl.  On exam left cheek incision is intact and healing well.  She does have some firmness and ecchymosis still present, as expected.  There is no signs of infection or concern on exam.  No tenderness noted to palpation. Patient is well-developed, well-nourished, no acute distress.  Breathing is unlabored.  A/P:  Recommend following up with Derm for ongoing close follow-ups given pathology.  Margins are free.  No further surgical intervention necessary at this time.  Discussed the importance of sunscreen, scar massage.  Discussed with patient if she continues to have lip swelling and heart palpitations with Benadryl use, recommend following up with PCP to further discuss.  She is asymptomatic today.  Pictures were obtained of the patient and placed in the chart with the patient's or guardian's permission.

## 2023-12-17 ENCOUNTER — Ambulatory Visit (INDEPENDENT_AMBULATORY_CARE_PROVIDER_SITE_OTHER): Admitting: Surgical

## 2023-12-17 ENCOUNTER — Encounter: Payer: Self-pay | Admitting: Physician Assistant

## 2023-12-17 VITALS — BP 144/75 | HR 79

## 2023-12-17 DIAGNOSIS — L989 Disorder of the skin and subcutaneous tissue, unspecified: Secondary | ICD-10-CM

## 2023-12-17 DIAGNOSIS — D0439 Carcinoma in situ of skin of other parts of face: Secondary | ICD-10-CM

## 2023-12-24 ENCOUNTER — Encounter: Payer: Self-pay | Admitting: Internal Medicine

## 2023-12-25 ENCOUNTER — Encounter: Admitting: Student

## 2024-01-08 ENCOUNTER — Encounter: Admitting: Student

## 2024-01-30 ENCOUNTER — Ambulatory Visit (INDEPENDENT_AMBULATORY_CARE_PROVIDER_SITE_OTHER): Payer: Self-pay

## 2024-01-30 VITALS — BP 111/73 | HR 86 | Ht 64.0 in | Wt 254.0 lb

## 2024-01-30 DIAGNOSIS — M6281 Muscle weakness (generalized): Secondary | ICD-10-CM | POA: Diagnosis not present

## 2024-01-30 DIAGNOSIS — Z6841 Body Mass Index (BMI) 40.0 and over, adult: Secondary | ICD-10-CM | POA: Insufficient documentation

## 2024-01-30 DIAGNOSIS — D8989 Other specified disorders involving the immune mechanism, not elsewhere classified: Secondary | ICD-10-CM | POA: Insufficient documentation

## 2024-01-30 DIAGNOSIS — M2550 Pain in unspecified joint: Secondary | ICD-10-CM | POA: Insufficient documentation

## 2024-01-30 NOTE — Progress Notes (Signed)
 New Patient Office Visit  Subjective    Patient ID: Catherine Turner, female    DOB: 1956-04-12  Age: 68 y.o. MRN: [id number]  CC:  Chief Complaint  Patient presents with   Establish Care    Pt states Surgeon after the skin surgery advised her she needed to find a new primary due to skin cancer being left on her arm for 11 years, and they wasn't helping her get healthy    HPI Catherine Turner presents to establish care Fatigue: Patient complains of fatigue. Symptoms began several months ago. Sentinal symptom the patient feels fatigue began with: witnessed or suspected sleep apnea. Symptoms of her fatigue have been anxiousness and fatigue with paradoxical insomnia. Patient describes the following psychologic symptoms: depression.  Patient denies fever, significant change in weight, unusual rashes, and GI blood loss. Symptoms have gradually worsened. Severity has been struggles to carry out day to day responsibilities.. Previous visits for this problem: yes, last seen 9 months ago by the patient's PCP.    Outpatient Encounter Medications as of 01/30/2024  Medication Sig   albuterol  (PROVENTIL ) (2.5 MG/3ML) 0.083% nebulizer solution Take 3 mLs (2.5 mg total) by nebulization every 6 (six) hours as needed for wheezing.   albuterol  (VENTOLIN  HFA) 108 (90 Base) MCG/ACT inhaler Inhale 2 puffs into the lungs every 4 (four) hours as needed for wheezing.   Ascorbic Acid (VITAMIN C) 500 MG CAPS Take 500 mg by mouth daily.   cholecalciferol (VITAMIN D3) 25 MCG (1000 UNIT) tablet Take 1,000 Units by mouth daily.   diphenhydrAMINE (BENADRYL) 50 MG tablet Take 50 mg by mouth at bedtime.   EPINEPHrine  0.3 mg/0.3 mL IJ SOAJ injection Inject 0.3 mg into the muscle as needed for anaphylaxis.   HYDROcodone -acetaminophen  (NORCO/VICODIN) 5-325 MG tablet Take 1 tablet by mouth daily as needed for severe pain.   HYDROcodone -acetaminophen  (NORCO/VICODIN) 5-325 MG tablet Take 1 tablet by mouth every 6 (six) hours as  needed for up to 5 doses for moderate pain (pain score 4-6) or severe pain (pain score 7-10). Take after surgery on 12/04/23 for postoperative pain.   ketoconazole (NIZORAL) 2 % cream Apply 1 Application topically 2 (two) times daily.   loratadine  (CLARITIN ) 10 MG tablet Take 10 mg by mouth daily.   LUMIGAN 0.01 % SOLN Place 1 drop into both eyes at bedtime.   Melatonin 10 MG TABS Take 10 mg by mouth at bedtime.   naproxen sodium (ALEVE) 220 MG tablet Take 220 mg by mouth daily as needed (pain).   ondansetron  (ZOFRAN ) 4 MG tablet Take 1 tablet (4 mg total) by mouth every 8 (eight) hours as needed for nausea or vomiting.   RESTASIS 0.05 % ophthalmic emulsion Place 1 drop into both eyes 2 (two) times daily as needed (dry eyes).   TIROSINT-SOL 100 MCG/ML SOLN Take 90 mcg by mouth daily.   No facility-administered encounter medications on file as of 01/30/2024.    Past Medical History:  Diagnosis Date   Angio-edema    Arm fracture    right arm    Asthma    Bulging lumbar disc    Cancer (HCC)    basal cell carcinoma   Complication of anesthesia    pt sts, I am a very shallow breather and they have to do something special for me/   COPD (chronic obstructive pulmonary disease) (HCC)    Degenerative joint disease (DJD) of hip    DJD (degenerative joint disease) of hip  Eczema    Elevated LFTs    Hip pain    Hypothyroidism    Insomnia    Lupus (systemic lupus erythematosus) (HCC)    Nerve damage    right arm.   PTSD (post-traumatic stress disorder)    Sjogren syndrome, unspecified (HCC)    Sleep apnea    no CPAP   Thyroid  disease    Urticaria     Past Surgical History:  Procedure Laterality Date   COLONOSCOPY N/A 08/12/2014   Procedure: COLONOSCOPY;  Surgeon: Lamar CHRISTELLA Hollingshead, MD;  Location: [facility];  Service: Endoscopy;  Laterality: N/A;  115   EXCISION MASS HEAD Left 12/04/2023   Procedure: EXCISION, MASS, HEAD;  Surgeon: Waddell Leonce NOVAK, MD;  Location: [facility];  Service: Plastics;  Laterality: Left;   LESION REMOVAL Right 04/23/2023   Procedure: EXCISION OF RIGHT UPPER ARM BASAL CELL CARCINOMA;  Surgeon: Waddell Leonce NOVAK, MD;  Location: [facility];  Service: Plastics;  Laterality: Right;   PUNCH BIOPSY OF SKIN Left 04/23/2023   Procedure: EXCISION OF LEFT FOOT LESION;  Surgeon: Waddell Leonce NOVAK, MD;  Location: [facility];  Service: Plastics;  Laterality: Left;   thumb surgery     TOOTH EXTRACTION  04/2022   UMBILICAL HERNIA REPAIR N/A 12/30/2014   Procedure: HERNIA REPAIR UMBILICAL ADULT WITH MESH;  Surgeon: Oneil Budge Md, MD;  Location: [facility];  Service: General;  Laterality: N/A;    Family History  Problem Relation Age of Onset   COPD Mother    Cancer - Lung Mother    Cancer Father    Hypertension Father    Drug abuse Sister    Dementia Maternal Aunt    Alcohol abuse Paternal Aunt    Alcohol abuse Paternal Uncle    Heart disease Maternal Grandmother    Heart disease Maternal Grandfather    Heart disease Paternal Grandmother    Heart disease Paternal Grandfather    Heart attack Paternal Grandfather    Physical abuse Other    Anxiety disorder Other    Birth defects Other        Downs Synddrome   ADD / ADHD Neg Hx    Bipolar disorder Neg Hx    OCD Neg Hx    Paranoid behavior Neg Hx    Schizophrenia Neg Hx    Seizures Neg Hx    Sexual abuse Neg Hx    Allergic rhinitis Neg Hx    Asthma Neg Hx    Eczema Neg Hx    Urticaria Neg Hx    Breast cancer Neg Hx     Social History   Socioeconomic History   Marital status: Single    Spouse name: Not on file   Number of children: Not on file   Years of education: Not on file   Highest education level: Associate degree: academic program  Occupational History   Not on file  Tobacco Use   Smoking status: Never    Passive exposure: Yes   Smokeless tobacco: Never  Vaping Use   Vaping status: Never Used  Substance and Sexual Activity   Alcohol use: Yes    Comment: 4 oz wine     Drug use: No   Sexual activity: Not Currently    Birth control/protection: None, Post-menopausal  Other Topics Concern   Not on file  Social History Narrative   Not on file   Social Drivers of Health   Financial Resource Strain: Medium Risk (01/26/2024)  Overall Financial Resource Strain (CARDIA)    Difficulty of Paying Living Expenses: Somewhat hard  Food Insecurity: Food Insecurity Present (01/26/2024)   Hunger Vital Sign    Worried About Running Out of Food in the Last Year: Sometimes true    Ran Out of Food in the Last Year: Often true  Transportation Needs: Unmet Transportation Needs (01/26/2024)   PRAPARE - Administrator, Civil Service (Medical): Yes    Lack of Transportation (Non-Medical): Yes  Physical Activity: Inactive (01/26/2024)   Exercise Vital Sign    Days of Exercise per Week: 0 days    Minutes of Exercise per Session: Not on file  Stress: Stress Concern Present (01/26/2024)   [employer] of Occupational Health - Occupational Stress Questionnaire    Feeling of Stress: To some extent  Social Connections: Moderately Integrated (01/26/2024)   Social Connection and Isolation Panel    Frequency of Communication with Friends and Family: More than three times a week    Frequency of Social Gatherings with Friends and Family: Three times a week    Attends Religious Services: More than 4 times per year    Active Member of Clubs or Organizations: Yes    Attends Banker Meetings: More than 4 times per year    Marital Status: Divorced  Intimate Partner Violence: Not At Risk (01/22/2023)   Received from [facility]   Humiliation, Afraid, Rape, and Kick questionnaire    Within the last year, have you been afraid of your partner or ex-partner?: No    Within the last year, have you been humiliated or emotionally abused in other ways by your partner or ex-partner?: No    Within the last year, have you been kicked, hit, slapped, or otherwise  physically hurt by your partner or ex-partner?: No    Within the last year, have you been raped or forced to have any kind of sexual activity by your partner or ex-partner?: No    ROS      Objective    BP 111/73   Pulse 86   Ht 5' 4 (1.626 m)   Wt 254 lb (115.2 kg)   SpO2 95%   BMI 43.60 kg/m   Physical Exam Vitals and nursing note reviewed.  Constitutional:      Appearance: She is obese.   Eyes:     Extraocular Movements: Extraocular movements intact.     Pupils: Pupils are equal, round, and reactive to light.    Cardiovascular:     Rate and Rhythm: Normal rate and regular rhythm.  Pulmonary:     Effort: Pulmonary effort is normal.     Breath sounds: Normal breath sounds.   Musculoskeletal:     Cervical back: Normal range of motion and neck supple.   Skin:    General: Skin is warm and dry.   Neurological:     Mental Status: She is alert and oriented to person, place, and time.     Motor: Weakness (getting up from chair) present.     Gait: Gait abnormal (unsteady when going from sitting to standing.).   Psychiatric:        Mood and Affect: Mood normal.        Thought Content: Thought content normal.       Assessment & Plan:   Problem List Items Addressed This Visit       Other   Autoimmune disorder (HCC) - Primary   Will check additional labs for further  evaluation, and also recommend follow-up visit with rheumatology for ongoing evaluation of Sjogren's syndrome and ongoing symptoms of fatigue and muscle weakness.        Relevant Orders   Myasthenia Gravis Profile (Completed)   Muscle weakness (generalized)   Likely multifactorial in nature d/t Sjogren's, thyroid  disorder, obesity and lack of physical activity.  We will check labs for further evaluation and request that she bring in most recent labs that her previous PCP did last month.  F/U according to lab results.        Relevant Orders   Myasthenia Gravis Profile (Completed)    No  follow-ups on file.   [name], FNP

## 2024-02-03 DIAGNOSIS — M6281 Muscle weakness (generalized): Secondary | ICD-10-CM | POA: Insufficient documentation

## 2024-02-03 NOTE — Assessment & Plan Note (Signed)
 Likely multifactorial in nature d/t Sjogren's, thyroid  disorder, obesity and lack of physical activity.  We will check labs for further evaluation and request that she bring in most recent labs that her previous PCP did last month.  F/U according to lab results.

## 2024-02-03 NOTE — Assessment & Plan Note (Signed)
 Will check additional labs for further evaluation, and also recommend follow-up visit with rheumatology for ongoing evaluation of Sjogren's syndrome and ongoing symptoms of fatigue and muscle weakness.

## 2024-02-14 ENCOUNTER — Ambulatory Visit: Payer: Self-pay

## 2024-02-15 LAB — MYASTHENIA GRAVIS PROFILE
AChR Binding Ab, Serum: <0.07 nmol/L (ref 0.00–0.24)
AChR-modulating Ab: 0 % (ref 0–45)
Acetylchol Block Ab: 18 % (ref 0–25)
Anti-striation Abs: NEGATIVE

## 2024-02-15 LAB — MUSK ANTIBODIES: MuSK Antibodies: <1 U/mL

## 2024-03-06 ENCOUNTER — Telehealth: Payer: Self-pay

## 2024-03-06 NOTE — Telephone Encounter (Signed)
 Copied from CRM [id number]. Topic: Appointments - Scheduling Inquiry for Clinic >> Mar 06, 2024  9:37 AM Catherine Turner wrote: Reason for CRM: pt called wanting to know when her next appt will be with [name].  There is not anything scheduled.   Reviewed chart notes, AVS, and lab results. Follow up not indicated for when patient is to return. Please advise.

## 2024-03-19 DIAGNOSIS — M9901 Segmental and somatic dysfunction of cervical region: Secondary | ICD-10-CM | POA: Diagnosis not present

## 2024-03-19 DIAGNOSIS — M9903 Segmental and somatic dysfunction of lumbar region: Secondary | ICD-10-CM | POA: Diagnosis not present

## 2024-03-19 DIAGNOSIS — M546 Pain in thoracic spine: Secondary | ICD-10-CM | POA: Diagnosis not present

## 2024-03-19 DIAGNOSIS — M62830 Muscle spasm of back: Secondary | ICD-10-CM | POA: Diagnosis not present

## 2024-03-19 DIAGNOSIS — M9902 Segmental and somatic dysfunction of thoracic region: Secondary | ICD-10-CM | POA: Diagnosis not present

## 2024-03-19 DIAGNOSIS — M542 Cervicalgia: Secondary | ICD-10-CM | POA: Diagnosis not present

## 2024-03-24 ENCOUNTER — Telehealth: Payer: Self-pay

## 2024-03-24 NOTE — Telephone Encounter (Signed)
 Copied from CRM [id number]. Topic: Medical Record Request - Provider/Facility Request >> Mar 18, 2024  3:59 PM Ivette P wrote: Reason for CRM: Thersia called in to notify paper for Dhb 3051 will be faxed, and confirmed fax number >> Mar 24, 2024 10:32 AM Logan F wrote: Thersia from [facility] called in to find out if office has recieved Dhb 3051 form that was faxed last week. Please call Thersia back at [phone]

## 2024-04-16 DIAGNOSIS — M9901 Segmental and somatic dysfunction of cervical region: Secondary | ICD-10-CM | POA: Diagnosis not present

## 2024-04-16 DIAGNOSIS — M9902 Segmental and somatic dysfunction of thoracic region: Secondary | ICD-10-CM | POA: Diagnosis not present

## 2024-04-16 DIAGNOSIS — M62830 Muscle spasm of back: Secondary | ICD-10-CM | POA: Diagnosis not present

## 2024-04-16 DIAGNOSIS — M9903 Segmental and somatic dysfunction of lumbar region: Secondary | ICD-10-CM | POA: Diagnosis not present

## 2024-04-16 DIAGNOSIS — M542 Cervicalgia: Secondary | ICD-10-CM | POA: Diagnosis not present

## 2024-04-16 DIAGNOSIS — M546 Pain in thoracic spine: Secondary | ICD-10-CM | POA: Diagnosis not present

## 2024-04-28 ENCOUNTER — Ambulatory Visit: Payer: Self-pay

## 2024-04-28 NOTE — Telephone Encounter (Signed)
 FYI Only or Action Required?: FYI only for provider.  Patient was last seen in primary care on 01/30/2024 by Bevely Doffing, FNP.  Called Nurse Triage reporting Panic Attack.  Symptoms began several weeks ago.  Interventions attempted: Rest, hydration, or home remedies.  Symptoms are: unchanged.  Triage Disposition: See PCP When Office is Open (Within 3 Days)  Patient/caregiver understands and will follow disposition?:  Reason for Disposition  MODERATE anxiety (e.g., persistent or frequent anxiety symptoms; interferes with sleep, school, or work)  Answer Assessment - Initial Assessment Questions On Aug 4th, had a severe panic attack (7 weeks). States that since then, she has had 2/10 dull epigastric pain. Has appt scheduled. Denies shortness of breath.   1. CONCERN: Did anything happen that prompted you to call today?      2/10 dull chest pressure since panic attack on Aug 4th  3. ONSET: How long have you been feeling this way? (e.g., hours, days, weeks)     03/10/24  4. HISTORY: Have you felt this way before? Have you ever been diagnosed with an anxiety problem in the past? (e.g., generalized anxiety disorder, panic attacks, PTSD). If Yes, ask: How was this problem treated? (e.g., medicines, counseling, etc.)     Yes, used to see a therapist  9. THERAPIST: Do you have a counselor or therapist? If Yes, ask: What is their name?     Yes, but needs new/updated referral as she has not been in some time  Protocols used: Anxiety and Panic Attack-A-AH Copied from CRM #[id number]. Topic: Clinical - Red Word Triage >> Apr 28, 2024  4:47 PM Brittney F wrote: [facility] that prompted transfer to Nurse Triage:   PLEASE CALL PATIENT BACK  CALL DISCONNECTED   Concern:   Chest pain measured at a 1-2 in lower part of her breast/ lung area; subtle chest tightness    Symptoms: anxiety attacks that    When did the symptoms start?: 03/10/2024 ( Had a huge anxiety attack episode  that resulted in a headache; intense torso pain; throat pain and aching in her lungs)   What have you done to aid in the concern ? Have you taken anything to assist with the matter?: No   Wanted to let you know I will be transferring you to further discuss your concern. Please be advised the nurse can assist with scheduling.

## 2024-04-29 ENCOUNTER — Encounter: Payer: Self-pay | Admitting: Family Medicine

## 2024-04-29 ENCOUNTER — Ambulatory Visit (INDEPENDENT_AMBULATORY_CARE_PROVIDER_SITE_OTHER): Admitting: Family Medicine

## 2024-04-29 ENCOUNTER — Encounter: Payer: Self-pay | Admitting: Internal Medicine

## 2024-04-29 VITALS — BP 123/74 | HR 65 | Ht 64.0 in | Wt 258.0 lb

## 2024-04-29 DIAGNOSIS — R634 Abnormal weight loss: Secondary | ICD-10-CM | POA: Insufficient documentation

## 2024-04-29 DIAGNOSIS — F410 Panic disorder [episodic paroxysmal anxiety] without agoraphobia: Secondary | ICD-10-CM

## 2024-04-29 DIAGNOSIS — R5383 Other fatigue: Secondary | ICD-10-CM | POA: Insufficient documentation

## 2024-04-29 NOTE — Patient Instructions (Signed)
 Referral placed.  Follow up with cardiology.  Take care  Dr. Bluford

## 2024-04-30 DIAGNOSIS — F410 Panic disorder [episodic paroxysmal anxiety] without agoraphobia: Secondary | ICD-10-CM | POA: Insufficient documentation

## 2024-04-30 NOTE — Progress Notes (Signed)
 Subjective:  Patient ID: Catherine Turner, female    DOB: May 02, 1956  Age: 68 y.o. MRN: [id number]  CC:   Chief Complaint  Patient presents with   Anxiety    HPI:  68 year old female presents for evaluation of the above.  Patient reports that she is experiencing worsening anxiety.  She states that she has had a recent panic attack about 7 weeks ago.  She states that she is having some ongoing chest wall pain on the right side as well.  This has not improved.  GAD-7 score of 4.  PHQ 9 score of 7.  Patient states that she would like to see a psychologist that she has seen in the past.  She is requesting a referral to Dr. Corina.  She states that she contacted the practice and they have agreed to see her.  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   Hypothyroidism 06/01/2022   Mixed hyperlipidemia 06/01/2022   Non-alcoholic fatty liver disease 06/01/2022   History of asthma 05/28/2022   Sjogren's syndrome 04/28/2022   Metabolic syndrome 02/04/2022   Positive ANA nucleolar, positive dsDNA 04/15/2017   Primary osteoarthritis of left hip 04/15/2017   Morbid obesity (HCC) 12/06/2016   Asthma with bronchitis 09/21/2014   Bursitis of right shoulder 09/09/2014   Biceps tendinitis on right 09/09/2014   Diverticulosis of colon without hemorrhage    Dyspnea 04/13/2014   Obstructive sleep apnea 07/30/2013   Seasonal and perennial allergic rhinitis 07/30/2013   PTSD (post-traumatic stress disorder) 01/13/2013   Anxiety state 01/13/2013   Insomnia secondary to depression with anxiety 01/13/2013   Celiac disease 01/19/1978    Social Hx   Social History   Socioeconomic History   Marital status: Single    Spouse name: Not on file   Number of children: Not on file   Years of education: Not on file   Highest education level:  Associate degree: academic program  Occupational History   Not on file  Tobacco Use   Smoking status: Never    Passive exposure: Yes   Smokeless tobacco: Never  Vaping Use   Vaping status: Never Used  Substance and Sexual Activity   Alcohol use: Yes    Comment: 4 oz wine    Drug use: No   Sexual activity: Not Currently    Birth control/protection: None, Post-menopausal  Other Topics Concern   Not on file  Social History Narrative   Not on file   Social Drivers of Health   Financial Resource Strain: Medium Risk (01/26/2024)   Overall Financial Resource Strain (CARDIA)    Difficulty of Paying Living Expenses: Somewhat hard  Food Insecurity: Food Insecurity Present (01/26/2024)   Hunger Vital Sign    Worried About Running Out of Food in the Last Year: Sometimes true    Ran Out of Food in the Last Year: Often true  Transportation Needs: Unmet Transportation Needs (01/26/2024)   PRAPARE - Transportation    Lack of Transportation (Medical): Yes    Lack of Transportation (Non-Medical): Yes  Physical Activity: Inactive (01/26/2024)   Exercise Vital Sign    Days of Exercise per Week: 0 days    Minutes of Exercise per Session: Not on file  Stress: Stress Concern Present (01/26/2024)   [employer] of Occupational Health - Occupational Stress Questionnaire  Feeling of Stress: To some extent  Social Connections: Moderately Integrated (01/26/2024)   Social Connection and Isolation Panel    Frequency of Communication with Friends and Family: More than three times a week    Frequency of Social Gatherings with Friends and Family: Three times a week    Attends Religious Services: More than 4 times per year    Active Member of Clubs or Organizations: Yes    Attends Banker Meetings: More than 4 times per year    Marital Status: Divorced    Review of Systems Per HPI  Objective:  BP 123/74   Pulse 65   Ht 5' 4 (1.626 m)   Wt 258 lb (117 kg)   SpO2 97%   BMI 44.29  kg/m      04/29/2024    9:46 AM 01/30/2024   10:26 AM 12/17/2023    8:53 AM  BP/Weight  Systolic BP 123 111 144  Diastolic BP 74 73 75  Wt. (Lbs) 258 254   BMI 44.29 kg/m2 43.6 kg/m2     Physical Exam Vitals and nursing note reviewed.  Constitutional:      General: She is not in acute distress.    Appearance: Normal appearance.  HENT:     Head: Normocephalic and atraumatic.  Cardiovascular:     Rate and Rhythm: Normal rate and regular rhythm.  Pulmonary:     Breath sounds: Normal breath sounds.  Neurological:     Mental Status: She is alert.  Psychiatric:        Mood and Affect: Mood normal.        Behavior: Behavior normal.     Lab Results  Component Value Date   WBC 6.9 04/17/2023   HGB 14.7 04/17/2023   HCT 45.5 04/17/2023   PLT 171 04/17/2023   GLUCOSE 118 (H) 04/17/2023   CHOL 221 (A) 03/17/2022   TRIG 327 (A) 03/17/2022   HDL 42 03/17/2022   LDLCALC 132 03/17/2022   ALT 18 04/17/2023   AST 24 04/17/2023   NA 139 04/17/2023   K 3.6 04/17/2023   CL 105 04/17/2023   CREATININE 0.92 04/17/2023   BUN 12 04/17/2023   CO2 27 04/17/2023   HGBA1C 5.4 03/17/2022     Assessment & Plan:  Panic disorder Assessment & Plan: Problematic/uncontrolled.  Referring to psychology as requested.  Orders: -     Ambulatory referral to Psychology    Follow-up: Advised to follow-up with PCP.  Jacqulyn Ahle DO [facility]

## 2024-04-30 NOTE — Assessment & Plan Note (Signed)
 Problematic/uncontrolled.  Referring to psychology as requested.

## 2024-05-02 NOTE — Telephone Encounter (Signed)
 Message from My Chart: Appointment Request From: Catherine Turner    With Provider: Diannah SQUIBB Mallipeddi [[facility]]    Preferred Date Range: Any    Preferred Times: Any    Reason for visit: Office Visit    Health Maintenance Topic:    Comments:  First available appointment PLEASE A strange dull pain in chest. Have already sent message explaining it. 04/30/2024

## 2024-05-09 ENCOUNTER — Encounter: Payer: Self-pay | Admitting: Psychology

## 2024-05-14 ENCOUNTER — Encounter: Payer: Self-pay | Admitting: Internal Medicine

## 2024-05-14 ENCOUNTER — Ambulatory Visit: Attending: Internal Medicine | Admitting: Internal Medicine

## 2024-05-14 VITALS — BP 142/84 | HR 74 | Ht 64.0 in | Wt 264.0 lb

## 2024-05-14 DIAGNOSIS — R0789 Other chest pain: Secondary | ICD-10-CM

## 2024-05-14 DIAGNOSIS — Z136 Encounter for screening for cardiovascular disorders: Secondary | ICD-10-CM

## 2024-05-14 NOTE — Patient Instructions (Signed)
 Medication Instructions:  Your physician recommends that you continue on your current medications as directed. Please refer to the Current Medication list given to you today.  *If you need a refill on your cardiac medications before your next appointment, please call your pharmacy*  Lab Work: None If you have labs (blood work) drawn today and your tests are completely normal, you will receive your results only by: MyChart Message (if you have MyChart) OR A paper copy in the mail If you have any lab test that is abnormal or we need to change your treatment, we will call you to review the results.  Testing/Procedures: None  Follow-Up: At [facility], you and your health needs are our priority.  As part of our continuing mission to provide you with exceptional heart care, our providers are all part of one team.  This team includes your primary Cardiologist (physician) and Advanced Practice Providers or APPs (Physician Assistants and Nurse Practitioners) who all work together to provide you with the care you need, when you need it.  Your next appointment:    Follow up as needed.   Provider:   You may see Vishnu P Mallipeddi, MD or one of the following Advanced Practice Providers on your designated Care Team:   [place], PA-C  Scotesia Leary, [place] Jacolyn Reedy, [place]     We recommend signing up for the patient portal called "MyChart".  Sign up information is provided on this After Visit Summary.  MyChart is used to connect with patients for Virtual Visits (Telemedicine).  Patients are able to view lab/test results, encounter notes, upcoming appointments, etc.  Non-urgent messages can be sent to your provider as well.   To learn more about what you can do with MyChart, go to [email or web address].   Other Instructions

## 2024-05-14 NOTE — Progress Notes (Unsigned)
 Cardiology Office Note  Date: 05/14/2024   ID: ADELL KOVAL, DOB 11/01/1955, MRN [id number]  PCP:  Bevely Doffing, FNP  Cardiologist:  Diannah SHAUNNA Maywood, MD Electrophysiologist:  None   Reason for Office Visit: Evaluation of chest pain   History of Present Illness: SHAWNELL Turner is a 68 y.o. female known to have OSA, bovine aortic arch is here for follow-up visit of chest pain.  Patient reported having chest pains at rest and not with exertion.  She thinks this be from anxiety.  She felt it was more like a panic attack.  Does not have any other symptoms of DOE, palpitations, leg swelling.  Past Medical History:  Diagnosis Date   Allergy     Angio-edema    Arm fracture    right arm    Asthma    Bulging lumbar disc    Cancer (HCC)    basal cell carcinoma   Cataract    Complication of anesthesia    pt sts, I am a very shallow breather and they have to do something special for me/   COPD (chronic obstructive pulmonary disease) (HCC)    Degenerative joint disease (DJD) of hip    Depression    DJD (degenerative joint disease) of hip    Eczema    Elevated LFTs    GERD (gastroesophageal reflux disease)    Glaucoma    Hip pain    Hyperlipidemia    Hypothyroidism    Insomnia    Lupus (systemic lupus erythematosus) (HCC)    Nerve damage    right arm.   Neuromuscular disorder (HCC)    PTSD (post-traumatic stress disorder)    Sjogren syndrome, unspecified    Sleep apnea    no CPAP   Thyroid  disease    Urticaria     Past Surgical History:  Procedure Laterality Date   COLONOSCOPY N/A 08/12/2014   Procedure: COLONOSCOPY;  Surgeon: Lamar CHRISTELLA Hollingshead, MD;  Location: [facility];  Service: Endoscopy;  Laterality: N/A;  115   COSMETIC SURGERY     EXCISION MASS HEAD Left 12/04/2023   Procedure: EXCISION, MASS, HEAD;  Surgeon: Waddell Leonce NOVAK, MD;  Location: [facility];  Service: Plastics;  Laterality: Left;   HERNIA REPAIR     LESION REMOVAL Right  04/23/2023   Procedure: EXCISION OF RIGHT UPPER ARM BASAL CELL CARCINOMA;  Surgeon: Waddell Leonce NOVAK, MD;  Location: [facility];  Service: Plastics;  Laterality: Right;   PUNCH BIOPSY OF SKIN Left 04/23/2023   Procedure: EXCISION OF LEFT FOOT LESION;  Surgeon: Waddell Leonce NOVAK, MD;  Location: [facility];  Service: Plastics;  Laterality: Left;   thumb surgery     TOOTH EXTRACTION  04/2022   UMBILICAL HERNIA REPAIR N/A 12/30/2014   Procedure: HERNIA REPAIR UMBILICAL ADULT WITH MESH;  Surgeon: Oneil Budge Md, MD;  Location: [facility];  Service: General;  Laterality: N/A;    Current Outpatient Medications  Medication Sig Dispense Refill   albuterol  (PROVENTIL ) (2.5 MG/3ML) 0.083% nebulizer solution Take 3 mLs (2.5 mg total) by nebulization every 6 (six) hours as needed for wheezing. 75 mL prn   albuterol  (VENTOLIN  HFA) 108 (90 Base) MCG/ACT inhaler Inhale 2 puffs into the lungs every 4 (four) hours as needed for wheezing. 1 each prn   Ascorbic Acid (VITAMIN C) 500 MG CAPS Take 500 mg by mouth daily.     cholecalciferol (VITAMIN D3) 25 MCG (1000 UNIT) tablet Take 1,000 Units by mouth daily.  diphenhydrAMINE (BENADRYL) 50 MG tablet Take 50 mg by mouth at bedtime.     EPINEPHrine  0.3 mg/0.3 mL IJ SOAJ injection Inject 0.3 mg into the muscle as needed for anaphylaxis.     HYDROcodone -acetaminophen  (NORCO/VICODIN) 5-325 MG tablet Take 1 tablet by mouth daily as needed for severe pain.     HYDROcodone -acetaminophen  (NORCO/VICODIN) 5-325 MG tablet Take 1 tablet by mouth every 6 (six) hours as needed for up to 5 doses for moderate pain (pain score 4-6) or severe pain (pain score 7-10). Take after surgery on 12/04/23 for postoperative pain. 5 tablet 0   ketoconazole (NIZORAL) 2 % cream Apply 1 Application topically 2 (two) times daily.     loratadine  (CLARITIN ) 10 MG tablet Take 10 mg by mouth daily.     LUMIGAN 0.01 % SOLN Place 1 drop into both eyes at bedtime.     Melatonin 10 MG TABS Take 10 mg by mouth at  bedtime.     naproxen sodium (ALEVE) 220 MG tablet Take 220 mg by mouth daily as needed (pain).     ondansetron  (ZOFRAN ) 4 MG tablet Take 1 tablet (4 mg total) by mouth every 8 (eight) hours as needed for nausea or vomiting. 20 tablet 0   RESTASIS 0.05 % ophthalmic emulsion Place 1 drop into both eyes 2 (two) times daily as needed (dry eyes).     TIROSINT-SOL 100 MCG/ML SOLN Take 90 mcg by mouth daily.     No current facility-administered medications for this visit.   Allergies:  Advair diskus [fluticasone -salmeterol], Ciprofloxacin, Clindamycin/lincomycin, Escitalopram , Jasmine fragrance, Beef-derived drug products, Breo ellipta  [fluticasone  furoate-vilanterol], Corn-containing products, Diclofenac, Furosemide, Gabapentin , Levothyroxine, Molds & smuts, Peanuts [peanut oil], Pork-derived products, Potassium-containing compounds, Pravastatin, Rice, Tilactase, Latex, and Sulfa antibiotics   Social History: The patient  reports that she has never smoked. She has been exposed to tobacco smoke. She has never used smokeless tobacco. She reports current alcohol use. She reports that she does not use drugs.   Family History: The patient's family history includes Alcohol abuse in her paternal aunt and paternal uncle; Anxiety disorder in an other family member; Birth defects in an other family member; COPD in her mother; Cancer in her father; Cancer - Lung in her mother; Dementia in her maternal aunt; Drug abuse in her sister; Heart attack in her paternal grandfather; Heart disease in her father, maternal grandfather, maternal grandmother, mother, paternal grandfather, and paternal grandmother; Hypertension in her father; Physical abuse in an other family member.   ROS:  Please see the history of present illness. Otherwise, complete review of systems is positive for none  All other systems are reviewed and negative.   Physical Exam: VS:  BP (!) 142/84   Pulse 74   Ht 5' 4 (1.626 m)   Wt 264 lb (119.7 kg)    SpO2 96%   BMI 45.32 kg/m , BMI Body mass index is 45.32 kg/m.  Wt Readings from Last 3 Encounters:  05/14/24 264 lb (119.7 kg)  04/29/24 258 lb (117 kg)  01/30/24 254 lb (115.2 kg)    General: Patient appears comfortable at rest. HEENT: Conjunctiva and lids normal, oropharynx clear with moist mucosa. Neck: Supple, no elevated JVP or carotid bruits, no thyromegaly. Lungs: Clear to auscultation, nonlabored breathing at rest. Cardiac: Regular rate and rhythm, no S3 or significant systolic murmur, no pericardial rub. Abdomen: Soft, nontender, no hepatomegaly, bowel sounds present, no guarding or rebound. Extremities: No pitting edema, distal pulses 2+. Skin: Warm and dry. Musculoskeletal:  No kyphosis. Neuropsychiatric: Alert and oriented x3, affect grossly appropriate.  Recent Labwork: No results found for requested labs within last 365 days.     Component Value Date/Time   CHOL 221 (A) 03/17/2022 0000   TRIG 327 (A) 03/17/2022 0000   HDL 42 03/17/2022 0000   LDLCALC 132 03/17/2022 0000    Assessment and Plan:  # Non-cardiac chest pain - Patient has chest pain at rest and not with exertion.  He is also occurring in setting of anxiety/panic attacks.  She has an appointment with her therapist soon.  # Bovine aortic arch per CT imaging in 2015 -In patients with bovine arch, imaging to assess for thoracic aortic aneurysm is reasonable according to ACC/AHA guidelines. This is a class IIb indication.  No evidence of aneurysms on the CT angio chest/aorta.  # Hypertriglyceridemia # Hypercholesterolemia -LDL from 4/24 reviewed, total cholesterol 240, TG 227, LDL 147.  LDL and TG elevated. Start rosuvastatin 10 mg nightly if patient agreeable, can be followed by PCP.   I have spent a total of 30 minutes with patient reviewing chart, EKGs, labs and examining patient as well as establishing an assessment and plan that was discussed with the patient.  > 50% of time was spent in direct  patient care.    Medication Adjustments/Labs and Tests Ordered: Current medicines are reviewed at length with the patient today.  Concerns regarding medicines are outlined above.   Tests Ordered: Orders Placed This Encounter  Procedures   EKG 12-Lead    Medication Changes: No orders of the defined types were placed in this encounter.   Disposition:  Follow up PRN  Signed Jaislyn Blinn Arleta Maywood, MD, 05/14/2024 11:46 AM    [facility] [address] [place], [place], [place] [address]

## 2024-05-16 DIAGNOSIS — R0789 Other chest pain: Secondary | ICD-10-CM | POA: Insufficient documentation

## 2024-06-04 ENCOUNTER — Telehealth: Payer: Self-pay | Admitting: *Deleted

## 2024-06-04 NOTE — Telephone Encounter (Signed)
 Received call from patient [phone]~ telephone.   Patient reports x1 week of discoloration above her navel. States that discoloration is purplish red in straight lines x2 above her belly button. Reports that surrounding area is slightly yellow this week.   Denies trauma or injury to area. Denies pain or swelling.   Advised that discoloration is likely not related to prior hernia repair in 2016. Advised to follow up with PCP for further evaluation.

## 2024-09-11 ENCOUNTER — Ambulatory Visit: Admitting: Psychology

## 2024-12-05 ENCOUNTER — Ambulatory Visit: Payer: Self-pay
